# Patient Record
Sex: Female | Born: 1950
Health system: Southern US, Community
[De-identification: ages and names within clinical notes are randomized; demographics above are authoritative.]

## PROBLEM LIST (undated history)

## (undated) DIAGNOSIS — Z8619 Personal history of other infectious and parasitic diseases: Secondary | ICD-10-CM

## (undated) DIAGNOSIS — G43909 Migraine, unspecified, not intractable, without status migrainosus: Secondary | ICD-10-CM

## (undated) DIAGNOSIS — M25569 Pain in unspecified knee: Secondary | ICD-10-CM

## (undated) HISTORY — PX: REPLACEMENT TOTAL KNEE: SUR1224

## (undated) HISTORY — DX: Pain in unspecified knee: M25.569

## (undated) HISTORY — PX: TUBAL LIGATION: SHX77

---

## 2006-10-18 ENCOUNTER — Emergency Department: Payer: Self-pay | Admitting: Emergency Medicine

## 2006-12-20 ENCOUNTER — Emergency Department: Payer: Self-pay | Admitting: Emergency Medicine

## 2007-05-05 ENCOUNTER — Other Ambulatory Visit: Payer: Self-pay

## 2007-05-05 ENCOUNTER — Ambulatory Visit: Payer: Self-pay | Admitting: General Practice

## 2007-05-12 ENCOUNTER — Inpatient Hospital Stay: Payer: Self-pay | Admitting: General Practice

## 2008-09-02 ENCOUNTER — Emergency Department: Payer: Self-pay | Admitting: Emergency Medicine

## 2010-12-08 ENCOUNTER — Emergency Department: Payer: Self-pay | Admitting: Emergency Medicine

## 2010-12-12 ENCOUNTER — Other Ambulatory Visit: Payer: Self-pay | Admitting: General Practice

## 2010-12-12 DIAGNOSIS — K611 Rectal abscess: Secondary | ICD-10-CM

## 2010-12-16 ENCOUNTER — Ambulatory Visit
Admission: RE | Admit: 2010-12-16 | Discharge: 2010-12-16 | Disposition: A | Discharge status: Home / Self Care | Payer: BC Managed Care – PPO | Source: Ambulatory Visit | Attending: General Practice | Admitting: General Practice

## 2010-12-16 DIAGNOSIS — K611 Rectal abscess: Secondary | ICD-10-CM

## 2010-12-16 MED ORDER — GADOBENATE DIMEGLUMINE 529 MG/ML IV SOLN
20.0000 mL | Freq: Once | INTRAVENOUS | Status: AC | PRN
  Administered 2010-12-16: 20 mL via INTRAVENOUS

## 2010-12-28 ENCOUNTER — Telehealth: Payer: Self-pay | Admitting: Gastroenterology

## 2010-12-28 ENCOUNTER — Encounter: Payer: Self-pay | Admitting: Gastroenterology

## 2010-12-28 NOTE — Telephone Encounter (Signed)
I was under the impression that the pt needed a lower EUS.  I called Cyndra Numbers and asked her to verify that the pt needs EUS or New pt appt and she will call back the records are on Dr Christella Hartigan desk for review for an EUS

## 2010-12-29 ENCOUNTER — Telehealth: Payer: Self-pay

## 2010-12-29 NOTE — Telephone Encounter (Signed)
Pt scheduled for Lower Eus and needs to have previsit.  Per Dr Christella Hartigan pt is to have FULL COLON prep. Left message on machine to call back

## 2010-12-29 NOTE — Telephone Encounter (Signed)
appt to be made for Lower EUS office appt cx.  See 12/29/10 phone note

## 2011-01-01 NOTE — Telephone Encounter (Signed)
Left message on machine to call back  

## 2011-01-01 NOTE — Telephone Encounter (Signed)
Pt is scheduled for a Lower EUS with a FULL COLON prep on 01/11/11 at [facility].  She needs a pre visit for her instructions.  Left message on machine to call back

## 2011-01-01 NOTE — Telephone Encounter (Signed)
Pt returned call and has been scheduled for her pre visit. No pre visit letter mailed appt this week.  She did verbalize understanding.

## 2011-01-04 ENCOUNTER — Ambulatory Visit (AMBULATORY_SURGERY_CENTER): Payer: BC Managed Care – PPO | Admitting: *Deleted

## 2011-01-04 VITALS — Ht 65.0 in | Wt 289.0 lb

## 2011-01-04 DIAGNOSIS — K6289 Other specified diseases of anus and rectum: Secondary | ICD-10-CM

## 2011-01-04 MED ORDER — PEG-KCL-NACL-NASULF-NA ASC-C 100 G PO SOLR: 1.0000 | Freq: Once | ORAL | Status: AC

## 2011-01-10 ENCOUNTER — Emergency Department: Payer: Self-pay | Admitting: Internal Medicine

## 2011-01-11 ENCOUNTER — Telehealth: Payer: Self-pay

## 2011-01-11 ENCOUNTER — Encounter: Payer: BC Managed Care – PPO | Admitting: Gastroenterology

## 2011-01-11 MED ORDER — PEG-KCL-NACL-NASULF-NA ASC-C 100 G PO SOLR: 1.0000 | ORAL | Status: DC

## 2011-01-11 NOTE — Telephone Encounter (Signed)
Pt rescheduled because she was in the ER last night until 4 am and can not make it.  She was moved to 02/15/11.  Endo aware   Pt prep has been resent

## 2011-02-12 ENCOUNTER — Ambulatory Visit: Payer: BC Managed Care – PPO | Admitting: Gastroenterology

## 2011-02-15 ENCOUNTER — Encounter: Payer: BC Managed Care – PPO | Admitting: Gastroenterology

## 2011-02-15 ENCOUNTER — Telehealth: Payer: Self-pay | Admitting: Gastroenterology

## 2011-02-15 NOTE — Telephone Encounter (Signed)
Dr Abbey Chatters will be notified.

## 2011-02-15 NOTE — Telephone Encounter (Signed)
She did not show for EUS this AM.  This is second time (no show or last minute cancellation).  She cannot reschedule unless seen in the office ([facility] with me) or by referring MD Avel Peace) again.

## 2015-08-13 DIAGNOSIS — M25561 Pain in right knee: Secondary | ICD-10-CM | POA: Insufficient documentation

## 2015-08-13 DIAGNOSIS — G8929 Other chronic pain: Secondary | ICD-10-CM | POA: Insufficient documentation

## 2015-09-01 ENCOUNTER — Emergency Department: Payer: Self-pay

## 2015-09-01 ENCOUNTER — Encounter: Payer: Self-pay | Admitting: Emergency Medicine

## 2015-09-01 ENCOUNTER — Emergency Department
Admission: EM | Admit: 2015-09-01 | Discharge: 2015-09-01 | Disposition: A | Discharge status: Home / Self Care | Payer: Self-pay | Attending: Emergency Medicine | Admitting: Emergency Medicine

## 2015-09-01 DIAGNOSIS — G43809 Other migraine, not intractable, without status migrainosus: Secondary | ICD-10-CM | POA: Insufficient documentation

## 2015-09-01 LAB — CBC WITH DIFFERENTIAL/PLATELET
Basophils Absolute: 0.1 10*3/uL (ref 0–0.1)
Basophils Relative: 1 %
EOS PCT: 0 %
Eosinophils Absolute: 0 10*3/uL (ref 0–0.7)
HEMATOCRIT: 39.3 % (ref 35.0–47.0)
Hemoglobin: 13.2 g/dL (ref 12.0–16.0)
LYMPHS ABS: 0.7 10*3/uL — AB (ref 1.0–3.6)
LYMPHS PCT: 8 %
MCH: 31.1 pg (ref 26.0–34.0)
MCHC: 33.6 g/dL (ref 32.0–36.0)
MCV: 92.5 fL (ref 80.0–100.0)
MONO ABS: 0.5 10*3/uL (ref 0.2–0.9)
Monocytes Relative: 6 %
NEUTROS ABS: 7.7 10*3/uL — AB (ref 1.4–6.5)
Neutrophils Relative %: 85 %
PLATELETS: 156 10*3/uL (ref 150–440)
RBC: 4.25 MIL/uL (ref 3.80–5.20)
RDW: 13 % (ref 11.5–14.5)
WBC: 9.1 10*3/uL (ref 3.6–11.0)

## 2015-09-01 LAB — BASIC METABOLIC PANEL
Anion gap: 6 (ref 5–15)
BUN: 20 mg/dL (ref 6–20)
CALCIUM: 9.2 mg/dL (ref 8.9–10.3)
CO2: 26 mmol/L (ref 22–32)
Chloride: 106 mmol/L (ref 101–111)
Creatinine, Ser: 0.89 mg/dL (ref 0.44–1.00)
GFR calc Af Amer: >60 mL/min (ref >60)
GLUCOSE: 105 mg/dL — AB (ref 65–99)
POTASSIUM: 4.3 mmol/L (ref 3.5–5.1)
Sodium: 138 mmol/L (ref 135–145)

## 2015-09-01 MED ORDER — SODIUM CHLORIDE 0.9 % IV BOLUS (SEPSIS)
1000.0000 mL | Freq: Once | INTRAVENOUS | Status: AC
  Administered 2015-09-01: 1000 mL via INTRAVENOUS

## 2015-09-01 MED ORDER — KETOROLAC TROMETHAMINE 30 MG/ML IJ SOLN
30.0000 mg | Freq: Once | INTRAMUSCULAR | Status: AC
  Administered 2015-09-01: 30 mg via INTRAVENOUS
  Filled 2015-09-01: qty 1

## 2015-09-01 MED ORDER — MAGNESIUM SULFATE 2 GM/50ML IV SOLN
2.0000 g | Freq: Once | INTRAVENOUS | Status: AC
  Administered 2015-09-01: 2 g via INTRAVENOUS
  Filled 2015-09-01: qty 50

## 2015-09-01 MED ORDER — PROCHLORPERAZINE MALEATE 10 MG PO TABS: 10.0000 mg | ORAL_TABLET | Freq: Three times a day (TID) | ORAL | Status: DC | PRN

## 2015-09-01 MED ORDER — PROCHLORPERAZINE EDISYLATE 5 MG/ML IJ SOLN
10.0000 mg | Freq: Four times a day (QID) | INTRAMUSCULAR | Status: DC | PRN
  Administered 2015-09-01: 10 mg via INTRAVENOUS
  Filled 2015-09-01: qty 2

## 2015-09-01 NOTE — ED Notes (Signed)
Pt provided with warm blacnket and ice chips

## 2015-09-01 NOTE — ED Notes (Signed)
Pt sleeping, no distress noted at this time

## 2015-09-01 NOTE — ED Notes (Addendum)
Patient presents to the ED with headache since midnight.  Patient states her head feels very tight, "like it's going to explode".  Patient also reports nausea, vomiting and dizziness.  Patient has vomited about 6-8 times since onset of headache.  Patient has a history of similar headaches.  Patient denies any abdominal pain.

## 2015-09-01 NOTE — ED Notes (Signed)
Pt reports waking with a head ache last night at midnight. Pt reports 1 episode of emesis. Pt reports she has had similar headaches.

## 2015-09-01 NOTE — ED Provider Notes (Signed)
[facility] Emergency Department Provider Note    ____________________________________________  Time seen: ~1315  I have reviewed the triage vital signs and the nursing notes.   HISTORY  Chief Complaint Headache and Emesis   History limited by: Not Limited   HPI [name] is a 65 y.o. female who presents to the emergency department today because of concerns for headache. She states it started roughly 12 hours ago. It has been constant and severe since then. She describes it as sharp and located globally throughout her brain. She states she does have a history of migraines. She tried taking some ibuprofen without any relief. She states last time she had a headache this severe was one year ago. She is not on any specific migraine medications. She has had accompanied nausea and vomiting. Denies any abdominal pain. Denies any trauma to her head.     History reviewed. No pertinent past medical history.  There are no active problems to display for this patient.   Past Surgical History  Procedure Laterality Date  . Tubal ligation    . Replacement total knee      left knee    Current Outpatient Rx  Name  Route  Sig  Dispense  Refill  . HYDROcodone-acetaminophen (VICODIN ES) 7.5-750 MG per tablet   Oral   Take 1 tablet by mouth 4 times daily as needed.         . ondansetron (ZOFRAN-ODT) 4 MG disintegrating tablet   Oral   Take 1 tablet by mouth as needed.         . peg 3350 powder (MOVIPREP) 100 G SOLR   Oral   Take 1 kit (100 g total) by mouth as directed. See written handout   1 kit   0     Allergies Review of patient's allergies indicates no known allergies.  No family history on file.  Social History Social History  Substance Use Topics  . Smoking status: Never Smoker   . Smokeless tobacco: Never Used  . Alcohol Use: No    Review of Systems  Constitutional: Negative for fever. Cardiovascular: Negative for chest  pain. Respiratory: Negative for shortness of breath. Gastrointestinal: Negative for abdominal pain. Positive for nausea and vomiting Neurological: Positive for headache   10-point ROS otherwise negative.  ____________________________________________   PHYSICAL EXAM:  VITAL SIGNS: ED Triage Vitals  Enc Vitals Group     BP 09/01/15 1143 181/67 mmHg     Pulse Rate 09/01/15 1143 70     Resp 09/01/15 1143 18     Temp 09/01/15 1143 98.3 F (36.8 C)     Temp Source 09/01/15 1143 Oral     SpO2 09/01/15 1143 100 %     Weight 09/01/15 1143 300 lb (136.079 kg)     Height 09/01/15 1143 _0  (1.651 m)     Head Cir --      Peak Flow --      Pain Score 09/01/15 1144 10   Constitutional: Alert and oriented. Well appearing and in no distress. Eyes: Conjunctivae are normal. PERRL. Normal extraocular movements. ENT   Head: Normocephalic and atraumatic.   Nose: No congestion/rhinnorhea.   Mouth/Throat: Mucous membranes are moist.   Neck: No stridor. Hematological/Lymphatic/Immunilogical: No cervical lymphadenopathy. Cardiovascular: Normal rate, regular rhythm.  No murmurs, rubs, or gallops. Respiratory: Normal respiratory effort without tachypnea nor retractions. Breath sounds are clear and equal bilaterally. No wheezes/rales/rhonchi. Gastrointestinal: Soft and nontender. No distention.  Genitourinary: Deferred Musculoskeletal:  Normal range of motion in all extremities. No joint effusions.  No lower extremity tenderness nor edema. Neurologic:  Normal speech and language. No gross focal neurologic deficits are appreciated.  Skin:  Skin is warm, dry and intact. No rash noted. Psychiatric: Mood and affect are normal. Speech and behavior are normal. Patient exhibits appropriate insight and judgment.  ____________________________________________    LABS (pertinent positives/negatives)  Labs Reviewed  CBC WITH DIFFERENTIAL/PLATELET - Abnormal; Notable for the following:     Neutro Abs 7.7 (*)    Lymphs Abs 0.7 (*)    All other components within normal limits  BASIC METABOLIC PANEL - Abnormal; Notable for the following:    Glucose, Bld 105 (*)    All other components within normal limits    ____________________________________________   EKG  None  ____________________________________________    RADIOLOGY  CT head IMPRESSION: No focal acute intracranial abnormality identified.  ____________________________________________   PROCEDURES  Procedure(s) performed: None  Critical Care performed: No  ____________________________________________   INITIAL IMPRESSION / ASSESSMENT AND PLAN / ED COURSE  Pertinent labs & imaging results that were available during my care of the patient were reviewed by me and considered in my medical decision making (see chart for details).  Patient presented to the emergency department today because of concerns for migraine. Patient states she has a history of migraines and last time she had a migraine this bad was last year. Patient states she has never had neuro imaging. Because of this I decided to get a CT head which did not show any concerning findings. Furthermore the patient did feel better after IV fluids and medications. Will plan on discharging home with prescription for Compazine ____________________________________________   FINAL CLINICAL IMPRESSION(S) / ED DIAGNOSES  Final diagnoses:  Other type of migraine     [name], MD 09/01/15 1904

## 2015-09-01 NOTE — Discharge Instructions (Signed)
Please seek medical attention for any high fevers, chest pain, shortness of breath, change in behavior, persistent vomiting, bloody stool or any other new or concerning symptoms. ° ° °Migraine Headache °A migraine headache is an intense, throbbing pain on one or both sides of your head. A migraine can last for 30 minutes to several hours. °CAUSES  °The exact cause of a migraine headache is not always known. However, a migraine may be caused when nerves in the brain become irritated and release chemicals that cause inflammation. This causes pain. °Certain things may also trigger migraines, such as: °· Alcohol. °· Smoking. °· Stress. °· Menstruation. °· Aged cheeses. °· Foods or drinks that contain nitrates, glutamate, aspartame, or tyramine. °· Lack of sleep. °· Chocolate. °· Caffeine. °· Hunger. °· Physical exertion. °· Fatigue. °· Medicines used to treat chest pain (nitroglycerine), birth control pills, estrogen, and some blood pressure medicines. °SIGNS AND SYMPTOMS °· Pain on one or both sides of your head. °· Pulsating or throbbing pain. °· Severe pain that prevents daily activities. °· Pain that is aggravated by any physical activity. °· Nausea, vomiting, or both. °· Dizziness. °· Pain with exposure to bright lights, loud noises, or activity. °· General sensitivity to bright lights, loud noises, or smells. °Before you get a migraine, you may get warning signs that a migraine is coming (aura). An aura may include: °· Seeing flashing lights. °· Seeing bright spots, halos, or zigzag lines. °· Having tunnel vision or blurred vision. °· Having feelings of numbness or tingling. °· Having trouble talking. °· Having muscle weakness. °DIAGNOSIS  °A migraine headache is often diagnosed based on: °· Symptoms. °· Physical exam. °· A CT scan or MRI of your head. These imaging tests cannot diagnose migraines, but they can help rule out other causes of headaches. °TREATMENT °Medicines may be given for pain and nausea.  Medicines can also be given to help prevent recurrent migraines.  °HOME CARE INSTRUCTIONS °· Only take over-the-counter or prescription medicines for pain or discomfort as directed by your health care provider. The use of long-term narcotics is not recommended. °· Lie down in a dark, quiet room when you have a migraine. °· Keep a journal to find out what may trigger your migraine headaches. For example, write down: °¨ What you eat and drink. °¨ How much sleep you get. °¨ Any change to your diet or medicines. °· Limit alcohol consumption. °· Quit smoking if you smoke. °· Get 7-9 hours of sleep, or as recommended by your health care provider. °· Limit stress. °· Keep lights dim if bright lights bother you and make your migraines worse. °SEEK IMMEDIATE MEDICAL CARE IF:  °· Your migraine becomes severe. °· You have a fever. °· You have a stiff neck. °· You have vision loss. °· You have muscular weakness or loss of muscle control. °· You start losing your balance or have trouble walking. °· You feel faint or pass out. °· You have severe symptoms that are different from your first symptoms. °MAKE SURE YOU:  °· Understand these instructions. °· Will watch your condition. °· Will get help right away if you are not doing well or get worse. °  °This information is not intended to replace advice given to you by your health care provider. Make sure you discuss any questions you have with your health care provider. °  °Document Released: 07/09/2005 Document Revised: 07/30/2014 Document Reviewed: 03/16/2013 °Elsevier Interactive Patient Education ©2016 [employer] ° °

## 2016-12-19 ENCOUNTER — Encounter: Payer: Self-pay | Admitting: *Deleted

## 2016-12-19 ENCOUNTER — Emergency Department: Payer: Medicare Other

## 2016-12-19 ENCOUNTER — Emergency Department
Admission: EM | Admit: 2016-12-19 | Discharge: 2016-12-20 | Disposition: A | Discharge status: Home / Self Care | Payer: Medicare Other | Attending: Emergency Medicine | Admitting: Emergency Medicine

## 2016-12-19 DIAGNOSIS — S60911A Unspecified superficial injury of right wrist, initial encounter: Secondary | ICD-10-CM | POA: Diagnosis present

## 2016-12-19 DIAGNOSIS — Y999 Unspecified external cause status: Secondary | ICD-10-CM | POA: Insufficient documentation

## 2016-12-19 DIAGNOSIS — Z79899 Other long term (current) drug therapy: Secondary | ICD-10-CM | POA: Insufficient documentation

## 2016-12-19 DIAGNOSIS — M25531 Pain in right wrist: Secondary | ICD-10-CM | POA: Insufficient documentation

## 2016-12-19 DIAGNOSIS — Y939 Activity, unspecified: Secondary | ICD-10-CM | POA: Diagnosis not present

## 2016-12-19 DIAGNOSIS — W1789XA Other fall from one level to another, initial encounter: Secondary | ICD-10-CM | POA: Diagnosis not present

## 2016-12-19 DIAGNOSIS — M25572 Pain in left ankle and joints of left foot: Secondary | ICD-10-CM | POA: Insufficient documentation

## 2016-12-19 DIAGNOSIS — Y929 Unspecified place or not applicable: Secondary | ICD-10-CM | POA: Insufficient documentation

## 2016-12-19 DIAGNOSIS — Z96652 Presence of left artificial knee joint: Secondary | ICD-10-CM | POA: Insufficient documentation

## 2016-12-19 MED ORDER — TRAMADOL HCL 50 MG PO TABS: 50.0000 mg | ORAL_TABLET | Freq: Four times a day (QID) | ORAL | 0 refills | Status: AC | PRN

## 2016-12-19 NOTE — ED Triage Notes (Signed)
Pt lost balance and fell off the porch tonight.  Pt has right wrist pain and left ankle pain.    Pt alert.

## 2016-12-20 NOTE — ED Provider Notes (Signed)
[facility] Emergency Department Provider Note  ____________________________________________  Time seen: Approximately 6:22 PM  I have reviewed the triage vital signs and the nursing notes.   HISTORY  Chief Complaint Wrist Pain    HPI [name] is a 66 y.o. female presents to emergency department after losing her balance and falling off her porch. Patient did not hit her head or loose consciousness. She reports 9/10 aching right wrist and left ankle pain. She denies numbness, tingling or loss of sensation of the upper or lower extremities. No neck pain, back pain, chest pain, chest tightness, shortness of breath, nausea, vomiting or abdominal pain. No alleviating measures have been attempted.   No past medical history on file.  There are no active problems to display for this patient.   Past Surgical History:  Procedure Laterality Date  . REPLACEMENT TOTAL KNEE     left knee  . TUBAL LIGATION      Prior to Admission medications   Medication Sig Start Date End Date Taking? Authorizing Provider  HYDROcodone-acetaminophen (VICODIN ES) 7.5-750 MG per tablet Take 1 tablet by mouth 4 times daily as needed. 12/12/10   [provider]  ondansetron (ZOFRAN-ODT) 4 MG disintegrating tablet Take 1 tablet by mouth as needed. 12/09/10   [provider]  peg 3350 powder (MOVIPREP) 100 G SOLR Take 1 kit (100 g total) by mouth as directed. See written handout 01/11/11   Milus Banister, MD  prochlorperazine (COMPAZINE) 10 MG tablet Take 1 tablet (10 mg total) by mouth every 8 (eight) hours as needed (headache). 09/01/15   [name], MD  traMADol (ULTRAM) 50 MG tablet Take 1 tablet (50 mg total) by mouth every 6 (six) hours as needed. 12/19/16 12/24/16  Lannie Fields, PA-C    Allergies Patient has no known allergies.  No family history on file.  Social History Social History  Substance Use Topics  . Smoking status: Never Smoker  .  Smokeless tobacco: Never Used  . Alcohol use No     Review of Systems  Constitutional: No fever/chills Eyes: No visual changes. No discharge ENT: No upper respiratory complaints. Cardiovascular: no chest pain. Respiratory: no cough. No SOB. Musculoskeletal: Patient has right wrist and left ankle pain.  Skin: Negative for rash, abrasions, lacerations, ecchymosis. Neurological: Negative for headaches, focal weakness or numbness.  ____________________________________________   PHYSICAL EXAM:  VITAL SIGNS: ED Triage Vitals  Enc Vitals Group     BP 12/19/16 2157 (!) 155/69     Pulse Rate 12/19/16 2157 75     Resp 12/19/16 2157 20     Temp 12/19/16 2157 98 F (36.7 C)     Temp Source 12/19/16 2157 Oral     SpO2 12/19/16 2157 99 %     Weight 12/19/16 2154 300 lb (136.1 kg)     Height 12/19/16 2154 5' 3"  (1.6 m)     Head Circumference --      Peak Flow --      Pain Score 12/19/16 2154 7     Pain Loc --      Pain Edu? --      Excl. in [place]? --      Constitutional: Alert and oriented. Well appearing and in no acute distress. Eyes: Conjunctivae are normal. PERRL. EOMI. Head: Atraumatic. Cardiovascular: Normal rate, regular rhythm. Normal S1 and S2.  Good peripheral circulation. Respiratory: Normal respiratory effort without tachypnea or retractions. Lungs CTAB. Good air entry to the bases with  no decreased or absent breath sounds. Musculoskeletal: Patient has 5 out of 5 strength in the upper and lower extremities bilaterally. Full range of motion at the right shoulder, right elbow and right wrist. No tenderness was elicited with palpation over the right anatomical snuffbox. Patient demonstrates full range of motion at the left ankle and left knee. Palpable radial, ulnar and dorsalis pedis pulses bilaterally and symmetrically. Neurologic:  Normal speech and language. No gross focal neurologic deficits are appreciated.  Skin:  Skin is warm, dry and intact. No rash  noted. Psychiatric: Mood and affect are normal. Speech and behavior are normal. Patient exhibits appropriate insight and judgement. ____________________________________________   LABS (all labs ordered are listed, but only abnormal results are displayed)  Labs Reviewed - No data to display ____________________________________________  EKG   ____________________________________________  RADIOLOGY Unk Pinto, personally viewed and evaluated these images (plain radiographs) as part of my medical decision making, as well as reviewing the written report by the radiologist.  Dg Forearm Right  Result Date: 12/19/2016 CLINICAL DATA:  Fall from [place].  RIGHT wrist pain. EXAM: RIGHT WRIST - COMPLETE 3+ VIEW; RIGHT FOREARM - 2 VIEW COMPARISON:  None. FINDINGS: There is no evidence of fracture or dislocation. There is no evidence of arthropathy or other focal bone abnormality. Faint vascular calcifications. IMPRESSION: Negative. Electronically Signed   By: Elon Alas M.D.   On: 12/19/2016 23:49   Dg Wrist Complete Right  Result Date: 12/19/2016 CLINICAL DATA:  Fall from porch.  RIGHT wrist pain. EXAM: RIGHT WRIST - COMPLETE 3+ VIEW; RIGHT FOREARM - 2 VIEW COMPARISON:  None. FINDINGS: There is no evidence of fracture or dislocation. There is no evidence of arthropathy or other focal bone abnormality. Faint vascular calcifications. IMPRESSION: Negative. Electronically Signed   By: Elon Alas M.D.   On: 12/19/2016 23:49   Dg Ankle Complete Left  Result Date: 12/19/2016 CLINICAL DATA:  66 year old female with fall and left ankle pain. EXAM: LEFT ANKLE COMPLETE - 3+ VIEW COMPARISON:  None. FINDINGS: There is no acute fracture or dislocation. The bones are well mineralized. No arthritic changes. The ankle mortise is intact. There is diffuse subcutaneous edema of the left lower extremity. IMPRESSION: No acute/ traumatic osseous pathology. Electronically Signed   By: Anner Crete  M.D.   On: 12/19/2016 23:51    ____________________________________________    PROCEDURES  Procedure(s) performed:    Procedures    Medications - No data to display   ____________________________________________   INITIAL IMPRESSION / ASSESSMENT AND PLAN / ED COURSE  Pertinent labs & imaging results that were available during my care of the patient were reviewed by me and considered in my medical decision making (see chart for details).  Review of the [place] CSRS was performed in accordance of the [place] prior to dispensing any controlled drugs.    Assessment and plan: Right wrist pain: Acute left ankle pain:  Patient presents to the emergency department with right wrist and left ankle pain after falling off a porch. Physical exam was reassuring. DG right wrist and DG left ankle reveal no acute fractures or bony abnormalities. Patient was discharged with tramadol. A referral was given to orthopedics, Dr. Marry Guan. All patient questions were answered. ____________________________________________  FINAL CLINICAL IMPRESSION(S) / ED DIAGNOSES  Final diagnoses:  Right wrist pain  Acute left ankle pain      NEW MEDICATIONS STARTED DURING THIS VISIT:  Discharge Medication List as of 12/19/2016 11:58 PM    START taking  these medications   Details  traMADol (ULTRAM) 50 MG tablet Take 1 tablet (50 mg total) by mouth every 6 (six) hours as needed., Starting Wed 12/19/2016, Until Mon 12/24/2016, Print            This chart was dictated using voice recognition software/Dragon. Despite best efforts to proofread, errors can occur which can change the meaning. Any change was purely unintentional.    Lannie Fields, PA-C 12/20/16 1830    Hinda Kehr, MD 12/20/16 [phone]

## 2019-12-13 ENCOUNTER — Other Ambulatory Visit: Payer: Self-pay

## 2019-12-13 ENCOUNTER — Emergency Department
Admission: EM | Admit: 2019-12-13 | Discharge: 2019-12-13 | Disposition: A | Discharge status: Home / Self Care | Payer: Medicare Other | Attending: Student | Admitting: Student

## 2019-12-13 ENCOUNTER — Encounter: Payer: Self-pay | Admitting: Emergency Medicine

## 2019-12-13 DIAGNOSIS — Z96652 Presence of left artificial knee joint: Secondary | ICD-10-CM | POA: Diagnosis not present

## 2019-12-13 DIAGNOSIS — B029 Zoster without complications: Secondary | ICD-10-CM | POA: Insufficient documentation

## 2019-12-13 DIAGNOSIS — R519 Headache, unspecified: Secondary | ICD-10-CM | POA: Insufficient documentation

## 2019-12-13 HISTORY — DX: Migraine, unspecified, not intractable, without status migrainosus: G43.909

## 2019-12-13 MED ORDER — DIPHENHYDRAMINE HCL 50 MG/ML IJ SOLN
25.0000 mg | Freq: Once | INTRAMUSCULAR | Status: AC
  Administered 2019-12-13: 25 mg via INTRAVENOUS
  Filled 2019-12-13: qty 1

## 2019-12-13 MED ORDER — METOCLOPRAMIDE HCL 5 MG/ML IJ SOLN
10.0000 mg | Freq: Once | INTRAMUSCULAR | Status: AC
  Administered 2019-12-13: 10 mg via INTRAVENOUS
  Filled 2019-12-13: qty 2

## 2019-12-13 MED ORDER — VALACYCLOVIR HCL 500 MG PO TABS
1000.0000 mg | ORAL_TABLET | Freq: Once | ORAL | Status: AC
  Administered 2019-12-13: 1000 mg via ORAL
  Filled 2019-12-13: qty 2

## 2019-12-13 MED ORDER — OXYCODONE HCL 5 MG PO TABS
5.0000 mg | ORAL_TABLET | Freq: Once | ORAL | Status: AC
  Administered 2019-12-13: 5 mg via ORAL
  Filled 2019-12-13: qty 1

## 2019-12-13 MED ORDER — ACETAMINOPHEN 500 MG PO TABS
1000.0000 mg | ORAL_TABLET | Freq: Once | ORAL | Status: AC
  Administered 2019-12-13: 1000 mg via ORAL
  Filled 2019-12-13: qty 2

## 2019-12-13 MED ORDER — VALACYCLOVIR HCL 1 G PO TABS: 1000.0000 mg | ORAL_TABLET | Freq: Three times a day (TID) | ORAL | 0 refills | Status: DC

## 2019-12-13 MED ORDER — SODIUM CHLORIDE 0.9 % IV BOLUS
1000.0000 mL | Freq: Once | INTRAVENOUS | Status: AC
  Administered 2019-12-13: 1000 mL via INTRAVENOUS

## 2019-12-13 MED ORDER — KETOROLAC TROMETHAMINE 30 MG/ML IJ SOLN
15.0000 mg | Freq: Once | INTRAMUSCULAR | Status: AC
  Administered 2019-12-13: 15 mg via INTRAVENOUS
  Filled 2019-12-13: qty 1

## 2019-12-13 MED ORDER — OXYCODONE HCL 5 MG PO TABS: 5.0000 mg | ORAL_TABLET | Freq: Four times a day (QID) | ORAL | 0 refills | Status: AC | PRN

## 2019-12-13 NOTE — ED Provider Notes (Signed)
[facility] Emergency Department Provider Note  ____________________________________________   First MD Initiated Contact with Patient 12/13/19 1224     (approximate)  I have reviewed the triage vital signs and the nursing notes.  History  Chief Complaint Herpes Zoster and Migraine    HPI [name] is a 69 y.o. female with a history of migraines who presents to the emergency department with complaint of headache, and complaint of rash to face/neck.    First, she complains of a migraine headache that has been ongoing for the last week.  This is consistent with her prior migraines.  She reports a throbbing type headache, moderate in severity.  Associated with photophobia, nausea.  Not improved at home with Tylenol.  She does not take any other medications for her migraines.  Headache has been progressively worsening over the last week.  Not thunderclap in description, not worst at onset.  No fevers or neck stiffness.  No associated weakness, numbness, tingling, speech difficulties.  States she typically comes to the emergency department and gets IV medications that help relieve her migraines.   Second, she also complains of a rash to the back of her head, neck, and ear on the right side.  This rash has been present for the last 3 days and worsening since onset.  Associated with a sharp, shooting type pain.  Moderate in severity, no radiation, no alleviating/aggravating components.  Rash is not pruritic.  She denies any known contacts or exposures.  Does have a history of chickenpox.  Not on any immune no suppressant medications.  No eye pain or blurred vision.  No rash to the anterior face.   Past Medical Hx Past Medical History:  Diagnosis Date  . Migraines     Problem List There are no problems to display for this patient.   Past Surgical Hx Past Surgical History:  Procedure Laterality Date  . REPLACEMENT TOTAL KNEE     left knee  . TUBAL  LIGATION      Medications Prior to Admission medications   Medication Sig Start Date End Date Taking? Authorizing Provider  HYDROcodone-acetaminophen (VICODIN ES) 7.5-750 MG per tablet Take 1 tablet by mouth 4 times daily as needed. 12/12/10   [provider]  ondansetron (ZOFRAN-ODT) 4 MG disintegrating tablet Take 1 tablet by mouth as needed. 12/09/10   [provider]  peg 3350 powder (MOVIPREP) 100 G SOLR Take 1 kit (100 g total) by mouth as directed. See written handout 01/11/11   Milus Banister, MD  prochlorperazine (COMPAZINE) 10 MG tablet Take 1 tablet (10 mg total) by mouth every 8 (eight) hours as needed (headache). 09/01/15   [name], MD    Allergies Patient has no known allergies.  Family Hx History reviewed. No pertinent family history.  Social Hx Social History   Tobacco Use  . Smoking status: Never Smoker  . Smokeless tobacco: Never Used  Substance Use Topics  . Alcohol use: No  . Drug use: No     Review of Systems  Constitutional: Negative for fever. Negative for chills. Eyes: Negative for visual changes. ENT: Negative for sore throat. Cardiovascular: Negative for chest pain. Respiratory: Negative for shortness of breath. Gastrointestinal: Negative for nausea. Negative for vomiting.  Genitourinary: Negative for dysuria. Musculoskeletal: Negative for leg swelling. Skin: + for rash. Neurological: + for headaches.   Physical Exam  Vital Signs: ED Triage Vitals  Enc Vitals Group     BP 12/13/19 1112 (!) 166/81  Pulse Rate 12/13/19 1112 88     Resp 12/13/19 1112 18     Temp 12/13/19 1112 98.6 F (37 C)     Temp Source 12/13/19 1112 Oral     SpO2 12/13/19 1112 96 %     Weight 12/13/19 1109 300 lb (136.1 kg)     Height 12/13/19 1109 _0  (1.6 m)     Head Circumference --      Peak Flow --      Pain Score 12/13/19 1109 8     Pain Loc --      Pain Edu? --      Excl. in [place]? --     Constitutional: Alert and oriented.   NAD.  Head: Dermatomal, maculopapular, erythematous, vesicular rash from the midline posterior head that extends anteriorly to include the RIGHT ear and the RIGHT lateral/anterior neck, stops at midline, does not cross midline. Rash does NOT involve the face anterior to the ear, no CN V involvement. Consistent with C2 distribution. No facial droop.  Eyes: Conjunctivae clear. Sclera anicteric. Pupils equal and symmetric. Nose: No masses or lesions. No congestion or rhinorrhea. Mouth/Throat: Wearing mask.  Neck: No stridor. Trachea midline.  Cardiovascular: Normal rate, regular rhythm. Extremities well perfused. Respiratory: Normal respiratory effort.  Lungs CTAB. Gastrointestinal: Soft. Non-distended. Non-tender.  Genitourinary: Deferred. Musculoskeletal: No lower extremity edema. No deformities. Neurologic:  Normal speech and language. No gross focal or lateralizing neurologic deficits are appreciated. Alert and oriented.  Face symmetric.  Tongue midline.  Cranial nerves II through XII intact. UE and LE strength 5/5 and symmetric. UE and LE SILT.  Skin: Dermatomal, maculopapular, erythematous, vesicular rash from the midline posterior head that extends anteriorly to include the RIGHT ear and the RIGHT lateral/anterior neck, stops at midline. Rash does NOT involve the face anterior to the ear, no CN V involvement. Consistent with C2 distribution. No facial droop.  Psychiatric: Mood and affect are appropriate for situation.   Procedures  Procedure(s) performed (including critical care):  Procedures   Initial Impression / Assessment and Plan / MDM / ED Course  69 y.o. female who presents to the ED for migraine headache, as well as a rash to the back of the head, ear, neck area consistent with shingles in the C2 distribution.  Headache seems consistent with her history of migraines, could also be potentially worsened by her shingles.  No fever, AMS, neck stiffness to suggest infectious  etiology of headache, do not suspect meningitis or encephalitis.  No associated neurological symptoms and she has a non-focal neurological exam, therefore doubt acute intracranial etiology of headache such as CVA.  Migraine is consistent with her prior headaches, not thunderclap in description or worst at onset, do not suspect SAH.  With regards to her rash, her clinical presentation is consistent with shingles in the C2 distribution.  She has no rash involving the anterior face or cranial nerve V distribution, therefore do not suspect ocular involvement.  Will plan for treatment of migraine with IV medications, and plan for course of antivirals for her shingles.  Anticipate discharge with Rx for valacyclovir and outpatient follow-up.  3:30 PMPpatient reports improvement in her headache and feels comfortable with discharge at this time.  Will plan for discharge with Rx for Valtrex and pain control.  Advised outpatient follow-up, given PCP referrals.  Patient voices understanding and is comfortable with the plan and discharge.  Given return precautions.  _______________________________   As part of my medical decision making I have  reviewed available labs, radiology tests, reviewed old records/performed chart review, obtained additional history from family.    Final Clinical Impression(s) / ED Diagnosis  Final diagnoses:  Complaint of headache  Herpes zoster without complication       Note:  This document was prepared using Dragon voice recognition software and may include unintentional dictation errors.   Lilia Pro., MD 12/13/19 [phone]

## 2019-12-13 NOTE — ED Triage Notes (Signed)
Pt here for migraine with hx of migraines.  + nausea and mild photophobia.  Pt also has shingles like rash to right neck/scalp. No vision changes. No pain inside ear. No fever. Lesions not draining at this time.

## 2019-12-13 NOTE — Discharge Instructions (Addendum)
Thank you for letting us take care of you in the emergency department today.  At this time, we believe your rash is related to shingles.  We will treat you for this with antiviral medication and pain medication.  Please continue to take any other regular, prescribed medications.   New medications we have prescribed:  Valacyclovir (Valtrex) - antiviral medication to help treat your shingles Oxycodone, pain medication  Please follow up with a primary care doctor for recheck within 1 week.  Information for several clinics as below.   Please return to the ER for any new or worsening symptoms.

## 2019-12-18 ENCOUNTER — Other Ambulatory Visit: Payer: Self-pay

## 2019-12-18 ENCOUNTER — Ambulatory Visit (INDEPENDENT_AMBULATORY_CARE_PROVIDER_SITE_OTHER): Payer: Medicare Other | Admitting: Family Medicine

## 2019-12-18 ENCOUNTER — Encounter: Payer: Self-pay | Admitting: Family Medicine

## 2019-12-18 VITALS — BP 192/83 | HR 67 | Temp 97.8°F | Ht 63.7 in | Wt 302.0 lb

## 2019-12-18 DIAGNOSIS — B029 Zoster without complications: Secondary | ICD-10-CM

## 2019-12-18 DIAGNOSIS — Z7689 Persons encountering health services in other specified circumstances: Secondary | ICD-10-CM

## 2019-12-18 DIAGNOSIS — M1711 Unilateral primary osteoarthritis, right knee: Secondary | ICD-10-CM

## 2019-12-18 MED ORDER — VALACYCLOVIR HCL 1 G PO TABS: 1000.0000 mg | ORAL_TABLET | Freq: Three times a day (TID) | ORAL | 0 refills | Status: AC

## 2019-12-18 MED ORDER — OXYCODONE-ACETAMINOPHEN 10-325 MG PO TABS: 1.0000 | ORAL_TABLET | Freq: Three times a day (TID) | ORAL | 0 refills | Status: AC | PRN

## 2019-12-18 MED ORDER — GABAPENTIN 300 MG PO CAPS: 300.0000 mg | ORAL_CAPSULE | Freq: Three times a day (TID) | ORAL | 0 refills | Status: DC

## 2019-12-18 MED ORDER — PREDNISONE 10 MG PO TABS: ORAL_TABLET | ORAL | 0 refills | Status: DC

## 2019-12-18 NOTE — Progress Notes (Signed)
BP (!) 192/83   Pulse 67   Temp 97.8 F (36.6 C) (Oral)   Ht 5' 3.7" (1.618 m)   Wt (!) 302 lb (137 kg)   SpO2 98%   BMI 52.33 kg/m    Subjective:    Patient ID: [name], female    DOB: 11-07-1950, 69 y.o.   MRN: [id number]  HPI: [name] is a 69 y.o. female  Chief Complaint  Patient presents with  . Establish Care  . Rash    head and neck x about a week   Patient presenting today to establish care. No known medical problems other than OA of right knee, states she hasn't had a regular provider in many years. Not taking any chronic medications at this time.  Main concern currently is a severe case of shingles that came up over a week ago. Has now been to the ER twice for it and is currently on valtrex and prednisone. Right sided neck up to scalp blistering painful rash. Some associated headache but otherwise feeling well aside from the painful rash. Mild improvement since starting prednisone.   Relevant past medical, surgical, family and social history reviewed and updated as indicated. Interim medical history since our last visit reviewed. Allergies and medications reviewed and updated.  Review of Systems  Per HPI unless specifically indicated above     Objective:    BP (!) 192/83   Pulse 67   Temp 97.8 F (36.6 C) (Oral)   Ht 5' 3.7" (1.618 m)   Wt (!) 302 lb (137 kg)   SpO2 98%   BMI 52.33 kg/m   Wt Readings from Last 3 Encounters:  12/18/19 (!) 302 lb (137 kg)  12/13/19 300 lb (136.1 kg)  12/19/16 300 lb (136.1 kg)    Physical Exam Vitals and nursing note reviewed.  Constitutional:      Appearance: Normal appearance. She is not ill-appearing.  HENT:     Head: Atraumatic.  Eyes:     Extraocular Movements: Extraocular movements intact.     Conjunctiva/sclera: Conjunctivae normal.  Cardiovascular:     Rate and Rhythm: Normal rate and regular rhythm.     Heart sounds: Normal heart sounds.  Pulmonary:     Effort: Pulmonary effort is  normal.     Breath sounds: Normal breath sounds.  Musculoskeletal:        General: Normal range of motion.     Cervical back: Normal range of motion and neck supple.  Skin:    General: Skin is warm and dry.     Findings: Erythema and rash (blistering erythematous rash right neck up to scalp, scabbing in some areas) present.  Neurological:     Mental Status: She is alert and oriented to person, place, and time.  Psychiatric:        Mood and Affect: Mood normal.        Thought Content: Thought content normal.        Judgment: Judgment normal.     Results for orders placed or performed during the hospital encounter of 09/01/15  CBC with Differential  Result Value Ref Range   WBC 9.1 3.6 - 11.0 K/uL   RBC 4.25 3.80 - 5.20 MIL/uL   Hemoglobin 13.2 12.0 - 16.0 g/dL   HCT 37.9 02.4 - 09.7 %   MCV 92.5 80.0 - 100.0 fL   MCH 31.1 26.0 - 34.0 pg   MCHC 33.6 32.0 - 36.0 g/dL   RDW 35.3 29.9 -  14.5 %   Platelets 156 150 - 440 K/uL   Neutrophils Relative % 85 %   Neutro Abs 7.7 (H) 1.4 - 6.5 K/uL   Lymphocytes Relative 8 %   Lymphs Abs 0.7 (L) 1.0 - 3.6 K/uL   Monocytes Relative 6 %   Monocytes Absolute 0.5 0.2 - 0.9 K/uL   Eosinophils Relative 0 %   Eosinophils Absolute 0.0 0 - 0.7 K/uL   Basophils Relative 1 %   Basophils Absolute 0.1 0 - 0.1 K/uL  Basic metabolic panel  Result Value Ref Range   Sodium 138 135 - 145 mmol/L   Potassium 4.3 3.5 - 5.1 mmol/L   Chloride 106 101 - 111 mmol/L   CO2 26 22 - 32 mmol/L   Glucose, Bld 105 (H) 65 - 99 mg/dL   BUN 20 6 - 20 mg/dL   Creatinine, Ser 0.89 0.44 - 1.00 mg/dL   Calcium 9.2 8.9 - 10.3 mg/dL   GFR calc non Af Amer >60 >60 mL/min   GFR calc Af Amer >60 >60 mL/min   Anion gap 6 5 - 15      Assessment & Plan:   Problem List Items Addressed This Visit      Musculoskeletal and Integument   Osteoarthritis of right knee    Followed by Orthopedics, continue per their recommendations      Relevant Medications   Acetaminophen  (TYLENOL 8 HOUR PO)   OXYCODONE HCL PO   predniSONE (DELTASONE) 10 MG tablet   oxyCODONE-acetaminophen (PERCOCET) 10-325 MG tablet    Other Visit Diagnoses    Herpes zoster without complication    -  Primary   Signiciant rash prsent and some progression still occurring in right scalp. Complete current rx's and start second round of valtrex and prednisone. F/u if worse   Relevant Medications   valACYclovir (VALTREX) 1000 MG tablet   Encounter to establish care           Follow up plan: Return in about 4 weeks (around 01/15/2020) for CPE.

## 2019-12-21 DIAGNOSIS — M1711 Unilateral primary osteoarthritis, right knee: Secondary | ICD-10-CM | POA: Insufficient documentation

## 2019-12-21 NOTE — Assessment & Plan Note (Signed)
Followed by Orthopedics, continue per their recommendations

## 2019-12-28 ENCOUNTER — Telehealth: Payer: Self-pay | Admitting: Family Medicine

## 2019-12-28 ENCOUNTER — Other Ambulatory Visit: Payer: Self-pay

## 2019-12-28 ENCOUNTER — Ambulatory Visit (INDEPENDENT_AMBULATORY_CARE_PROVIDER_SITE_OTHER): Payer: Medicare Other | Admitting: Family Medicine

## 2019-12-28 ENCOUNTER — Encounter: Payer: Self-pay | Admitting: Family Medicine

## 2019-12-28 VITALS — BP 158/83 | HR 80 | Temp 98.5°F | Ht 65.0 in | Wt 300.0 lb

## 2019-12-28 DIAGNOSIS — B029 Zoster without complications: Secondary | ICD-10-CM

## 2019-12-28 MED ORDER — PREDNISONE 10 MG PO TABS: ORAL_TABLET | ORAL | 0 refills | Status: DC

## 2019-12-28 MED ORDER — VALACYCLOVIR HCL 1 G PO TABS: 1000.0000 mg | ORAL_TABLET | Freq: Three times a day (TID) | ORAL | 0 refills | Status: DC

## 2019-12-28 MED ORDER — OXYCODONE-ACETAMINOPHEN 10-325 MG PO TABS: 1.0000 | ORAL_TABLET | Freq: Four times a day (QID) | ORAL | 0 refills | Status: AC | PRN

## 2019-12-28 NOTE — Telephone Encounter (Signed)
Called pt to schedule an appt no answer left vm to call back to schedule  Copied from CRM [id number]. Topic: General - Other >> Dec 28, 2019 11:18 AM Marylen Ponto wrote: Reason for CRM: Pt stated she needs to speak with Roosvelt Maser about shingles. Pt declined to provide more details and just requested that Roosvelt Maser return her call.

## 2019-12-28 NOTE — Progress Notes (Signed)
BP (!) 158/83 (BP Location: Left Arm, Patient Position: Sitting, Cuff Size: Normal)   Pulse 80   Temp 98.5 F (36.9 C) (Oral)   Ht 5\' 5"  (1.651 m)   Wt 300 lb (136.1 kg)   SpO2 97%   BMI 49.92 kg/m    Subjective:    Patient ID: , female    DOB: 07-10-1951, 69 y.o.   MRN: [id number]  HPI: [name] is a 69 y.o. female  Chief Complaint  Patient presents with  . Herpes Zoster    Patient states she has sores on her head. Extremely painful. Would like refill on medications.    Here today for f/u severe shingles rash on right side of neck/face/scalp. Has been on valtrex and prednisone for 2 weeks now which has been helping some and neck rash is mostly dried up but scalp rash is still significant, scabbing and severely painful. Oxycodone and gabapentin helping some but still in severe pain at times. Denies visual changes, hearing changes, fever, chills, syncope, confusion.   Relevant past medical, surgical, family and social history reviewed and updated as indicated. Interim medical history since our last visit reviewed. Allergies and medications reviewed and updated.  Review of Systems  Per HPI unless specifically indicated above     Objective:    BP (!) 158/83 (BP Location: Left Arm, Patient Position: Sitting, Cuff Size: Normal)   Pulse 80   Temp 98.5 F (36.9 C) (Oral)   Ht 5\' 5"  (1.651 m)   Wt 300 lb (136.1 kg)   SpO2 97%   BMI 49.92 kg/m   Wt Readings from Last 3 Encounters:  12/28/19 300 lb (136.1 kg)  12/18/19 (!) 302 lb (137 kg)  12/13/19 300 lb (136.1 kg)    Physical Exam Vitals and nursing note reviewed.  Constitutional:      Appearance: Normal appearance. She is not ill-appearing.  HENT:     Head: Atraumatic.  Eyes:     Extraocular Movements: Extraocular movements intact.     Conjunctiva/sclera: Conjunctivae normal.  Cardiovascular:     Rate and Rhythm: Normal rate and regular rhythm.     Heart sounds: Normal heart sounds.    Pulmonary:     Effort: Pulmonary effort is normal.     Breath sounds: Normal breath sounds.  Musculoskeletal:        General: Normal range of motion.     Cervical back: Normal range of motion and neck supple.  Skin:    General: Skin is warm.     Findings: Rash (scabbed over shingles rash, worst on right side of scalp with some erythema at base) present.  Neurological:     Mental Status: She is alert and oriented to person, place, and time.  Psychiatric:        Mood and Affect: Mood normal.        Thought Content: Thought content normal.        Judgment: Judgment normal.     Results for orders placed or performed during the hospital encounter of 09/01/15  CBC with Differential  Result Value Ref Range   WBC 9.1 3.6 - 11.0 K/uL   RBC 4.25 3.80 - 5.20 MIL/uL   Hemoglobin 13.2 12.0 - 16.0 g/dL   HCT 12/15/19 10/30/15 - 50.3 %   MCV 92.5 80.0 - 100.0 fL   MCH 31.1 26.0 - 34.0 pg   MCHC 33.6 32.0 - 36.0 g/dL   RDW 54.6 56.8 - 12.7 %  Platelets 156 150 - 440 K/uL   Neutrophils Relative % 85 %   Neutro Abs 7.7 (H) 1.4 - 6.5 K/uL   Lymphocytes Relative 8 %   Lymphs Abs 0.7 (L) 1.0 - 3.6 K/uL   Monocytes Relative 6 %   Monocytes Absolute 0.5 0.2 - 0.9 K/uL   Eosinophils Relative 0 %   Eosinophils Absolute 0.0 0 - 0.7 K/uL   Basophils Relative 1 %   Basophils Absolute 0.1 0 - 0.1 K/uL  Basic metabolic panel  Result Value Ref Range   Sodium 138 135 - 145 mmol/L   Potassium 4.3 3.5 - 5.1 mmol/L   Chloride 106 101 - 111 mmol/L   CO2 26 22 - 32 mmol/L   Glucose, Bld 105 (H) 65 - 99 mg/dL   BUN 20 6 - 20 mg/dL   Creatinine, Ser 0.89 0.44 - 1.00 mg/dL   Calcium 9.2 8.9 - 10.3 mg/dL   GFR calc non Af Amer >60 >60 mL/min   GFR calc Af Amer >60 >60 mL/min   Anion gap 6 5 - 15      Assessment & Plan:   Problem List Items Addressed This Visit    None    Visit Diagnoses    Herpes zoster without complication    -  Primary   Mildly improved but still significantly painful. Will restart  more valtrex, prednisone, and continue prn gabapentin and oxycodone (precautions reviewed)   Relevant Medications   valACYclovir (VALTREX) 1000 MG tablet       Follow up plan: Return for as scheduled.

## 2019-12-30 ENCOUNTER — Telehealth: Payer: Medicare Other | Admitting: Nurse Practitioner

## 2019-12-31 ENCOUNTER — Ambulatory Visit: Payer: Medicare Other | Admitting: Family Medicine

## 2019-12-31 ENCOUNTER — Ambulatory Visit (INDEPENDENT_AMBULATORY_CARE_PROVIDER_SITE_OTHER): Payer: Medicare Other | Admitting: Family Medicine

## 2019-12-31 ENCOUNTER — Other Ambulatory Visit: Payer: Self-pay

## 2019-12-31 ENCOUNTER — Encounter: Payer: Self-pay | Admitting: Family Medicine

## 2019-12-31 VITALS — BP 175/102 | HR 100 | Temp 97.5°F | Wt 300.0 lb

## 2019-12-31 DIAGNOSIS — F419 Anxiety disorder, unspecified: Secondary | ICD-10-CM | POA: Diagnosis not present

## 2019-12-31 DIAGNOSIS — B029 Zoster without complications: Secondary | ICD-10-CM

## 2019-12-31 MED ORDER — HYDROXYZINE HCL 25 MG PO TABS: 25.0000 mg | ORAL_TABLET | Freq: Three times a day (TID) | ORAL | 0 refills | Status: DC | PRN

## 2019-12-31 NOTE — Progress Notes (Signed)
BP (!) 175/102 (BP Location: Left Arm, Patient Position: Sitting, Cuff Size: Normal)   Pulse 100   Temp (!) 97.5 F (36.4 C) (Oral)   Wt 300 lb (136.1 kg)   SpO2 97%   BMI 49.92 kg/m    Subjective:    Patient ID: [name], female    DOB: 11-25-1950, 68 y.o.   MRN: [id number]  HPI: [name] is a 69 y.o. female  Chief Complaint  Patient presents with  . note of clearance    stating Shingles is not contagious for ICU.    [name] presents today needing a note stating that her shingles is not contagious so that she can see her son in the ICU. She was diagnosed by the ER on 12/13/19. Her rash has been really persistent and she has undergone 3 rounds of prednisone and valtrex. Her 3rd round of anti-viral and prednisone was just started 3 days ago. She continues with a lot of pain. She has been rubbing her rash. She is very upset and moaning and crying in the room. She is barely able to get any words out. Her daughter is with her and notes that she has been under a lot of stress as her son had a massive heart attack and is in the ICU.  Relevant past medical, surgical, family and social history reviewed and updated as indicated. Interim medical history since our last visit reviewed. Allergies and medications reviewed and updated.  Review of Systems  Constitutional: Negative.   Respiratory: Negative.   Cardiovascular: Negative.   Skin: Positive for rash. Negative for color change, pallor and wound.  Neurological: Positive for headaches. Negative for dizziness, tremors, seizures, syncope, facial asymmetry, speech difficulty, weakness, light-headedness and numbness.  Psychiatric/Behavioral: Positive for agitation and dysphoric mood. Negative for behavioral problems, confusion, decreased concentration, hallucinations, self-injury, sleep disturbance and suicidal ideas. The patient is nervous/anxious. The patient is not hyperactive.     Per HPI unless specifically indicated  above     Objective:    BP (!) 175/102 (BP Location: Left Arm, Patient Position: Sitting, Cuff Size: Normal)   Pulse 100   Temp (!) 97.5 F (36.4 C) (Oral)   Wt 300 lb (136.1 kg)   SpO2 97%   BMI 49.92 kg/m   Wt Readings from Last 3 Encounters:  12/31/19 300 lb (136.1 kg)  12/28/19 300 lb (136.1 kg)  12/18/19 (!) 302 lb (137 kg)    Physical Exam Vitals and nursing note reviewed.  Constitutional:      General: She is not in acute distress.    Appearance: Normal appearance. She is obese. She is not ill-appearing, toxic-appearing or diaphoretic.     Comments: Moaning and crying intensely   HENT:     Head: Normocephalic and atraumatic.     Right Ear: External ear normal.     Left Ear: External ear normal.     Nose: Nose normal.     Mouth/Throat:     Mouth: Mucous membranes are moist.     Pharynx: Oropharynx is clear.  Eyes:     General: No scleral icterus.       Right eye: No discharge.        Left eye: No discharge.     Extraocular Movements: Extraocular movements intact.     Conjunctiva/sclera: Conjunctivae normal.     Pupils: Pupils are equal, round, and reactive to light.  Cardiovascular:     Rate and Rhythm: Normal rate and regular rhythm.  Pulses: Normal pulses.     Heart sounds: Normal heart sounds. No murmur heard.  No friction rub. No gallop.   Pulmonary:     Effort: Pulmonary effort is normal. No respiratory distress.     Breath sounds: Normal breath sounds. No stridor. No wheezing, rhonchi or rales.  Chest:     Chest wall: No tenderness.  Musculoskeletal:        General: Normal range of motion.     Cervical back: Normal range of motion and neck supple.  Skin:    General: Skin is warm and dry.     Capillary Refill: Capillary refill takes less than 2 seconds.     Coloration: Skin is not jaundiced or pale.     Findings: Rash present. No bruising, erythema or lesion.     Comments: Excoriated erythematous rash on back of R side of her head and on top, no  sign of vessicles anywhere in the rash, some small ulcers, very tender to even light palpation  Neurological:     General: No focal deficit present.     Mental Status: She is alert and oriented to person, place, and time. Mental status is at baseline.  Psychiatric:        Mood and Affect: Mood normal.        Behavior: Behavior normal.        Thought Content: Thought content normal.        Judgment: Judgment normal.     Results for orders placed or performed during the hospital encounter of 09/01/15  CBC with Differential  Result Value Ref Range   WBC 9.1 3.6 - 11.0 K/uL   RBC 4.25 3.80 - 5.20 MIL/uL   Hemoglobin 13.2 12.0 - 16.0 g/dL   HCT 10.9 35 - 47 %   MCV 92.5 80.0 - 100.0 fL   MCH 31.1 26.0 - 34.0 pg   MCHC 33.6 32.0 - 36.0 g/dL   RDW 32.3 55.7 - 32.2 %   Platelets 156 150 - 440 K/uL   Neutrophils Relative % 85 %   Neutro Abs 7.7 (H) 1.4 - 6.5 K/uL   Lymphocytes Relative 8 %   Lymphs Abs 0.7 (L) 1.0 - 3.6 K/uL   Monocytes Relative 6 %   Monocytes Absolute 0.5 0 - 0 K/uL   Eosinophils Relative 0 %   Eosinophils Absolute 0.0 0 - 0 K/uL   Basophils Relative 1 %   Basophils Absolute 0.1 0 - 0 K/uL  Basic metabolic panel  Result Value Ref Range   Sodium 138 135 - 145 mmol/L   Potassium 4.3 3.5 - 5.1 mmol/L   Chloride 106 101 - 111 mmol/L   CO2 26 22 - 32 mmol/L   Glucose, Bld 105 (H) 65 - 99 mg/dL   BUN 20 6 - 20 mg/dL   Creatinine, Ser 0.25 0.44 - 1.00 mg/dL   Calcium 9.2 8.9 - 42.7 mg/dL   GFR calc non Af Amer >60 >60 mL/min   GFR calc Af Amer >60 >60 mL/min   Anion gap 6 5 - 15      Assessment & Plan:   Problem List Items Addressed This Visit    None    Visit Diagnoses    Herpes zoster without complication    -  Primary   No vesicles. Rash appears crusted over, but is still present. Finish treatment and follow up as scheduled. Not as able provided.    Acute anxiety  Rx for hydroxyzine given to help with acute anxiety. Call with any concerns. Follow  up as scheduled.    Relevant Medications   hydrOXYzine (ATARAX/VISTARIL) 25 MG tablet       Follow up plan: Return As scheduled.

## 2020-01-19 ENCOUNTER — Encounter: Payer: Self-pay | Admitting: Family Medicine

## 2020-01-19 ENCOUNTER — Ambulatory Visit (INDEPENDENT_AMBULATORY_CARE_PROVIDER_SITE_OTHER): Payer: Medicare Other | Admitting: Family Medicine

## 2020-01-19 ENCOUNTER — Other Ambulatory Visit: Payer: Self-pay

## 2020-01-19 VITALS — BP 144/69 | HR 91 | Temp 98.1°F | Ht 64.0 in | Wt 303.0 lb

## 2020-01-19 DIAGNOSIS — Z780 Asymptomatic menopausal state: Secondary | ICD-10-CM

## 2020-01-19 DIAGNOSIS — Z1211 Encounter for screening for malignant neoplasm of colon: Secondary | ICD-10-CM | POA: Diagnosis not present

## 2020-01-19 DIAGNOSIS — Z1159 Encounter for screening for other viral diseases: Secondary | ICD-10-CM

## 2020-01-19 DIAGNOSIS — F4321 Adjustment disorder with depressed mood: Secondary | ICD-10-CM

## 2020-01-19 DIAGNOSIS — Z6841 Body Mass Index (BMI) 40.0 and over, adult: Secondary | ICD-10-CM

## 2020-01-19 DIAGNOSIS — B029 Zoster without complications: Secondary | ICD-10-CM | POA: Diagnosis not present

## 2020-01-19 DIAGNOSIS — Z1231 Encounter for screening mammogram for malignant neoplasm of breast: Secondary | ICD-10-CM

## 2020-01-19 DIAGNOSIS — Z0000 Encounter for general adult medical examination without abnormal findings: Secondary | ICD-10-CM

## 2020-01-19 DIAGNOSIS — R030 Elevated blood-pressure reading, without diagnosis of hypertension: Secondary | ICD-10-CM | POA: Diagnosis not present

## 2020-01-19 MED ORDER — GABAPENTIN 600 MG PO TABS: 600.0000 mg | ORAL_TABLET | Freq: Three times a day (TID) | ORAL | 2 refills | Status: DC | PRN

## 2020-01-19 MED ORDER — ALPRAZOLAM 0.5 MG PO TABS
0.5000 mg | ORAL_TABLET | Freq: Every day | ORAL | 0 refills | Status: DC | PRN
Start: 2020-01-19 — End: 2020-03-03

## 2020-01-19 NOTE — Patient Instructions (Signed)
Please call at this number ([facility]) to schedule your mammogram. [phone] 

## 2020-01-19 NOTE — Progress Notes (Signed)
BP (!) 144/69 Comment: right lower arm  Pulse 91   Temp 98.1 F (36.7 C) (Oral)   Ht 5\' 4"  (1.626 m)   Wt (!) 303 lb (137.4 kg)   SpO2 98%   BMI 52.01 kg/m    Subjective:    Patient ID: [name], female    DOB: 1951/05/21, 69 y.o.   MRN: [id number]  HPI: Sue SantosShirley A Stella is a 69 y.o. female presenting on 01/19/2020 for comprehensive medical examination. Current medical complaints include:see below  Still dealing with severe shingles outbreak on right side of scalp down right side of face and neck. Rash slowly improving after several rounds of prednisone and valtrex. Taking gabapentin for pain control but still having severe breakthrough pain and cannot lay her head down on that side.   Just lost her son suddenly to a heart attack a week or so ago, dealing with intense grief since. Having trouble sleeping or relaxing, crying nearly constantly. Has not been working with grief counseling at all. Does have a solid support system. Denies SI/HI.   [facility] 12/18/2019  Decreased Interest 0  Down, Depressed, Hopeless 0  PHQ - 2 Score 0   GAD 7 : Generalized Anxiety Score 12/18/2019  Nervous, Anxious, on Edge 2  Control/stop worrying 0  Worry too much - different things 3  Trouble relaxing 0  Restless 0  Easily annoyed or irritable 1  Afraid - awful might happen 0  Total GAD 7 Score 6     She currently lives with: Menopausal Symptoms: no  Depression Screen done today and results listed below:  [facility]  Decreased Interest 0  Down, Depressed, Hopeless 0  PHQ - 2 Score 0    The patient does not have a history of falls. I did complete a risk assessment for falls. A plan of care for falls was documented.   Past Medical History:  Past Medical History:  Diagnosis Date  . Knee pain   . Migraines     Surgical History:  Past Surgical History:  Procedure Laterality Date  . REPLACEMENT TOTAL KNEE     left knee  . TUBAL  LIGATION      Medications:  Current Outpatient Medications on File Prior to Visit  Medication Sig  . Acetaminophen (TYLENOL 8 HOUR PO) Take by mouth as needed.  . diclofenac Sodium (VOLTAREN) 1 % GEL Apply topically 3 (three) times daily.    No current facility-administered medications on file prior to visit.    Allergies:  No Known Allergies  Social History:  Social History   Socioeconomic History  . Marital status: Widowed    Spouse name: Not on file  . Number of children: Not on file  . Years of education: Not on file  . Highest education level: Not on file  Occupational History  . Not on file  Tobacco Use  . Smoking status: Never Smoker  . Smokeless tobacco: Never Used  Vaping Use  . Vaping Use: Never used  Substance and Sexual Activity  . Alcohol use: No  . Drug use: No  . Sexual activity: Not Currently  Other Topics Concern  . Not on file  Social History Narrative  . Not on file   Social Determinants of Health   Financial Resource Strain:   . Difficulty of Paying Living Expenses:   Food Insecurity:   . Worried About Programme researcher, broadcasting/film/videounning Out of Food in the Last Year:   . Ran  Out of Food in the Last Year:   Transportation Needs:   . Lack of Transportation (Medical):   Marland Kitchen Lack of Transportation (Non-Medical):   Physical Activity:   . Days of Exercise per Week:   . Minutes of Exercise per Session:   Stress:   . Feeling of Stress :   Social Connections:   . Frequency of Communication with Friends and Family:   . Frequency of Social Gatherings with Friends and Family:   . Attends Religious Services:   . Active Member of Clubs or Organizations:   . Attends Banker Meetings:   Marland Kitchen Marital Status:   Intimate Partner Violence:   . Fear of Current or Ex-Partner:   . Emotionally Abused:   Marland Kitchen Physically Abused:   . Sexually Abused:    Social History   Tobacco Use  Smoking Status Never Smoker  Smokeless Tobacco Never Used   Social History   Substance  and Sexual Activity  Alcohol Use No    Family History:  Family History  Problem Relation Age of Onset  . Heart disease Mother   . Hypertension Mother   . Cancer Father   . Cancer Brother     Past medical history, surgical history, medications, allergies, family history and social history reviewed with patient today and changes made to appropriate areas of the chart.   Review of Systems - General ROS: negative Psychological ROS: positive for - anxiety and grief Ophthalmic ROS: negative ENT ROS: negative Allergy and Immunology ROS: negative Hematological and Lymphatic ROS: negative Endocrine ROS: negative Breast ROS: negative for breast lumps Respiratory ROS: no cough, shortness of breath, or wheezing Cardiovascular ROS: no chest pain or dyspnea on exertion Gastrointestinal ROS: no abdominal pain, change in bowel habits, or black or bloody stools Genito-Urinary ROS: no dysuria, trouble voiding, or hematuria Musculoskeletal ROS: negative Neurological ROS: no TIA or stroke symptoms Dermatological ROS: positive for rash All other ROS negative except what is listed above and in the HPI.      Objective:    BP (!) 144/69 Comment: right lower arm  Pulse 91   Temp 98.1 F (36.7 C) (Oral)   Ht 5\' 4"  (1.626 m)   Wt (!) 303 lb (137.4 kg)   SpO2 98%   BMI 52.01 kg/m   Wt Readings from Last 3 Encounters:  01/19/20 (!) 303 lb (137.4 kg)  12/31/19 300 lb (136.1 kg)  12/28/19 300 lb (136.1 kg)    Physical Exam Vitals and nursing note reviewed.  Constitutional:      General: She is not in acute distress.    Appearance: She is well-developed. She is obese.  HENT:     Head: Atraumatic.     Right Ear: External ear normal.     Left Ear: External ear normal.     Nose: Nose normal.     Mouth/Throat:     Pharynx: No oropharyngeal exudate.  Eyes:     General: No scleral icterus.    Conjunctiva/sclera: Conjunctivae normal.     Pupils: Pupils are equal, round, and reactive to  light.  Neck:     Thyroid: No thyromegaly.  Cardiovascular:     Rate and Rhythm: Normal rate and regular rhythm.     Heart sounds: Normal heart sounds.  Pulmonary:     Effort: Pulmonary effort is normal. No respiratory distress.     Breath sounds: Normal breath sounds.  Abdominal:     General: Bowel sounds are normal.  Palpations: Abdomen is soft. There is no mass.     Tenderness: There is no abdominal tenderness.  Musculoskeletal:        General: No tenderness. Normal range of motion.     Cervical back: Normal range of motion and neck supple.  Lymphadenopathy:     Cervical: No cervical adenopathy.  Skin:    General: Skin is warm and dry.     Findings: Rash (well healing scabbed over herpes zoster rash right scalp, right side of face and neck) present.  Neurological:     Mental Status: She is alert and oriented to person, place, and time.     Cranial Nerves: No cranial nerve deficit.  Psychiatric:        Behavior: Behavior normal.     Comments: tearful     Results for orders placed or performed in visit on 01/19/20  Microscopic Examination   Urine  Result Value Ref Range   WBC, UA 11-30 (A) 0 - 5 /hpf   RBC 3-10 (A) 0 - 2 /hpf   Epithelial Cells (non renal) 0-10 0 - 10 /hpf   Mucus, UA Present Not Estab.   Bacteria, UA Few (A) None seen/Few  Urine Culture, Reflex   Urine  Result Value Ref Range   Urine Culture, Routine WILL FOLLOW   UA/M w/rflx Culture, Routine   Specimen: Urine   Urine  Result Value Ref Range   Specific Gravity, UA >1.030 (H) 1.005 - 1.030   pH, UA 5.0 5.0 - 7.5   Color, UA Yellow Yellow   Appearance Ur Cloudy (A) Clear   Leukocytes,UA 2+ (A) Negative   Protein,UA 1+ (A) Negative/Trace   Glucose, UA Negative Negative   Ketones, UA Negative Negative   RBC, UA 1+ (A) Negative   Bilirubin, UA Negative Negative   Urobilinogen, Ur 1.0 0.2 - 1.0 mg/dL   Nitrite, UA Negative Negative   Microscopic Examination See below:    Urinalysis Reflex  Comment       Assessment & Plan:   Problem List Items Addressed This Visit      Other   Obesity   Relevant Orders   Lipid Panel w/o Chol/HDL Ratio   TSH   Elevated blood pressure reading   Relevant Orders   CBC with Differential/Platelet   Comprehensive metabolic panel   UA/M w/rflx Culture, Routine (Completed)    Other Visit Diagnoses    Grief    -  Primary   Declines daily medicine or grief counseling. Small supply of prn xanax given with strict precautions. This should last several months   Annual physical exam       Herpes zoster without complication       Rash slowly resolving, but pain persists. Increase gabapentin to 600 mg TID prn and continue prn OTC pain relievers. F/u in 6 weeks for recheck pain   Colon cancer screening       Relevant Orders   Ambulatory referral to Gastroenterology   Need for hepatitis C screening test       Relevant Orders   Hepatitis C antibody   Encounter for screening mammogram for malignant neoplasm of breast       Relevant Orders   MM DIGITAL SCREENING BILATERAL   Postmenopausal estrogen deficiency       Relevant Orders   DG DXA BODY COMPOSITION       Follow up plan: Return in about 6 weeks (around 03/01/2020) for Grief, shingles f/u.   LABORATORY TESTING:  -  Pap smear: not applicable  IMMUNIZATIONS:   - Tdap: Tetanus vaccination status reviewed: postponed. - Influenza: Postponed to flu season - Pneumovax: postponed - Prevnar: postponed - HPV: Not applicable - Zostavax vaccine: postponed due to active shingles infection  SCREENING: -Mammogram: Ordered today  - Colonoscopy: Ordered today  - Bone Density: Ordered today   PATIENT COUNSELING:   Advised to take 1 mg of folate supplement per day if capable of pregnancy.   Sexuality: Discussed sexually transmitted diseases, partner selection, use of condoms, avoidance of unintended pregnancy  and contraceptive alternatives.   Advised to avoid cigarette smoking.  I discussed  with the patient that most people either abstain from alcohol or drink within safe limits (<=14/week and <=4 drinks/occasion for males, <=7/weeks and <= 3 drinks/occasion for females) and that the risk for alcohol disorders and other health effects rises proportionally with the number of drinks per week and how often a drinker exceeds daily limits.  Discussed cessation/primary prevention of drug use and availability of treatment for abuse.   Diet: Encouraged to adjust caloric intake to maintain  or achieve ideal body weight, to reduce intake of dietary saturated fat and total fat, to limit sodium intake by avoiding high sodium foods and not adding table salt, and to maintain adequate dietary potassium and calcium preferably from fresh fruits, vegetables, and low-fat dairy products.    stressed the importance of regular exercise  Injury prevention: Discussed safety belts, safety helmets, smoke detector, smoking near bedding or upholstery.   Dental health: Discussed importance of regular tooth brushing, flossing, and dental visits.    NEXT PREVENTATIVE PHYSICAL DUE IN 1 YEAR. Return in about 6 weeks (around 03/01/2020) for Grief, shingles f/u.

## 2020-01-20 LAB — COMPREHENSIVE METABOLIC PANEL
ALT: 8 IU/L (ref 0–32)
AST: 12 IU/L (ref 0–40)
Albumin/Globulin Ratio: 1.8 (ref 1.2–2.2)
Albumin: 4.2 g/dL (ref 3.8–4.8)
Alkaline Phosphatase: 66 IU/L (ref 48–121)
BUN/Creatinine Ratio: 11 — ABNORMAL LOW (ref 12–28)
BUN: 11 mg/dL (ref 8–27)
Bilirubin Total: 0.5 mg/dL (ref 0.0–1.2)
CO2: 23 mmol/L (ref 20–29)
Calcium: 9.4 mg/dL (ref 8.7–10.3)
Chloride: 104 mmol/L (ref 96–106)
Creatinine, Ser: 0.96 mg/dL (ref 0.57–1.00)
GFR calc Af Amer: 70 mL/min/{1.73_m2} (ref >59)
GFR calc non Af Amer: 61 mL/min/{1.73_m2} (ref >59)
Globulin, Total: 2.3 g/dL (ref 1.5–4.5)
Glucose: 86 mg/dL (ref 65–99)
Potassium: 4 mmol/L (ref 3.5–5.2)
Sodium: 142 mmol/L (ref 134–144)
Total Protein: 6.5 g/dL (ref 6.0–8.5)

## 2020-01-20 LAB — CBC WITH DIFFERENTIAL/PLATELET
Basophils Absolute: 0 10*3/uL (ref 0.0–0.2)
Basos: 1 %
EOS (ABSOLUTE): 0.2 10*3/uL (ref 0.0–0.4)
Eos: 4 %
Hematocrit: 43.3 % (ref 34.0–46.6)
Hemoglobin: 14.1 g/dL (ref 11.1–15.9)
Immature Grans (Abs): 0 10*3/uL (ref 0.0–0.1)
Immature Granulocytes: 0 %
Lymphocytes Absolute: 0.8 10*3/uL (ref 0.7–3.1)
Lymphs: 18 %
MCH: 32.2 pg (ref 26.6–33.0)
MCHC: 32.6 g/dL (ref 31.5–35.7)
MCV: 99 fL — ABNORMAL HIGH (ref 79–97)
Monocytes Absolute: 0.5 10*3/uL (ref 0.1–0.9)
Monocytes: 12 %
Neutrophils Absolute: 3 10*3/uL (ref 1.4–7.0)
Neutrophils: 65 %
Platelets: 253 10*3/uL (ref 150–450)
RBC: 4.38 x10E6/uL (ref 3.77–5.28)
RDW: 13.9 % (ref 11.7–15.4)
WBC: 4.5 10*3/uL (ref 3.4–10.8)

## 2020-01-20 LAB — LIPID PANEL W/O CHOL/HDL RATIO
Cholesterol, Total: 191 mg/dL (ref 100–199)
HDL: 49 mg/dL (ref >39)
LDL Chol Calc (NIH): 119 mg/dL — ABNORMAL HIGH (ref 0–99)
Triglycerides: 126 mg/dL (ref 0–149)
VLDL Cholesterol Cal: 23 mg/dL (ref 5–40)

## 2020-01-20 LAB — HEPATITIS C ANTIBODY: Hep C Virus Ab: <0.1 s/co ratio (ref 0.0–0.9)

## 2020-01-20 LAB — TSH: TSH: 2.73 u[IU]/mL (ref 0.450–4.500)

## 2020-01-21 LAB — UA/M W/RFLX CULTURE, ROUTINE
Bilirubin, UA: NEGATIVE
Glucose, UA: NEGATIVE
Ketones, UA: NEGATIVE
Nitrite, UA: NEGATIVE
Specific Gravity, UA: >1.03 — ABNORMAL HIGH (ref 1.005–1.030)
Urobilinogen, Ur: 1 mg/dL (ref 0.2–1.0)
pH, UA: 5 (ref 5.0–7.5)

## 2020-01-21 LAB — MICROSCOPIC EXAMINATION

## 2020-01-21 LAB — URINE CULTURE, REFLEX

## 2020-01-22 ENCOUNTER — Telehealth: Payer: Self-pay | Admitting: Family Medicine

## 2020-01-22 NOTE — Telephone Encounter (Signed)
Copied from CRM [id number]. Topic: Medicare AWV >> Jan 22, 2020 11:23 AM Claudette Laws R wrote: Reason for CRM:  Left message for patient to call back and schedule Medicare Annual Wellness Visit (AWV) to be done virtually.  No hx of AWV per [facility] AWV-I due 08/23/2017  Please schedule at anytime with [facility]-Nurse Health Advisor.      45 Minutes appointment

## 2020-02-02 ENCOUNTER — Other Ambulatory Visit: Payer: Self-pay

## 2020-02-02 ENCOUNTER — Telehealth (INDEPENDENT_AMBULATORY_CARE_PROVIDER_SITE_OTHER): Payer: Self-pay | Admitting: Gastroenterology

## 2020-02-02 ENCOUNTER — Telehealth: Payer: Self-pay | Admitting: Family Medicine

## 2020-02-02 DIAGNOSIS — Z1211 Encounter for screening for malignant neoplasm of colon: Secondary | ICD-10-CM

## 2020-02-02 MED ORDER — NA SULFATE-K SULFATE-MG SULF 17.5-3.13-1.6 GM/177ML PO SOLN: 1.0000 | Freq: Once | ORAL | 0 refills | Status: AC

## 2020-02-02 NOTE — Telephone Encounter (Signed)
Individual has been contacted regarding ED referral and has stated she is not interested in becoming a pt. No further attempts to contact individual will be made. 

## 2020-02-02 NOTE — Progress Notes (Signed)
Gastroenterology Pre-Procedure Review  Request Date: Friday 02/19/20 Requesting Physician: Dr. Bonna Gains  PATIENT REVIEW QUESTIONS: The patient responded to the following health history questions as indicated:    1. Are you having any GI issues? no 2. Do you have a personal history of Polyps? no 3. Do you have a family history of Colon Cancer or Polyps? no 4. Diabetes Mellitus? no 5. Joint replacements in the past 12 months?no 6. Major health problems in the past 3 months?01/04/20 Neuralgia 7. Any artificial heart valves, MVP, or defibrillator?no    MEDICATIONS & ALLERGIES:    Patient reports the following regarding taking any anticoagulation/antiplatelet therapy:   Plavix, Coumadin, Eliquis, Xarelto, Lovenox, Pradaxa, Brilinta, or Effient? no Aspirin? no  Patient confirms/reports the following medications:  Current Outpatient Medications  Medication Sig Dispense Refill  . Acetaminophen (TYLENOL 8 HOUR PO) Take by mouth as needed.    . ALPRAZolam (XANAX) 0.5 MG tablet Take 1 tablet (0.5 mg total) by mouth daily as needed for anxiety. 30 tablet 0  . diclofenac Sodium (VOLTAREN) 1 % GEL Apply topically 3 (three) times daily.     Marland Kitchen gabapentin (NEURONTIN) 600 MG tablet Take 1 tablet (600 mg total) by mouth 3 (three) times daily as needed. 90 tablet 2  . Na Sulfate-K Sulfate-Mg Sulf 17.5-3.13-1.6 GM/177ML SOLN Take 1 kit by mouth once for 1 dose. 354 mL 0   No current facility-administered medications for this visit.    Patient confirms/reports the following allergies:  No Known Allergies  No orders of the defined types were placed in this encounter.   AUTHORIZATION INFORMATION Primary Insurance: 1D#: Group #:  Secondary Insurance: 1D#: Group #:  SCHEDULE INFORMATION: Date: Friday 02/19/20 Time: Location:[facility]

## 2020-02-15 ENCOUNTER — Ambulatory Visit
Admission: RE | Admit: 2020-02-15 | Discharge: 2020-02-15 | Disposition: A | Discharge status: Home / Self Care | Payer: Medicare Other | Source: Ambulatory Visit | Attending: Family Medicine | Admitting: Family Medicine

## 2020-02-15 DIAGNOSIS — Z1231 Encounter for screening mammogram for malignant neoplasm of breast: Secondary | ICD-10-CM

## 2020-02-17 ENCOUNTER — Other Ambulatory Visit
Admission: RE | Admit: 2020-02-17 | Discharge: 2020-02-17 | Disposition: A | Discharge status: Home / Self Care | Payer: Medicare Other | Source: Ambulatory Visit | Attending: Gastroenterology | Admitting: Gastroenterology

## 2020-02-17 ENCOUNTER — Other Ambulatory Visit: Payer: Self-pay

## 2020-02-17 DIAGNOSIS — Z20822 Contact with and (suspected) exposure to covid-19: Secondary | ICD-10-CM | POA: Diagnosis not present

## 2020-02-17 DIAGNOSIS — Z01812 Encounter for preprocedural laboratory examination: Secondary | ICD-10-CM | POA: Diagnosis present

## 2020-02-17 LAB — SARS CORONAVIRUS 2 (TAT 6-24 HRS): SARS Coronavirus 2: NEGATIVE

## 2020-02-19 ENCOUNTER — Ambulatory Visit: Payer: Medicare Other | Admitting: Certified Registered"

## 2020-02-19 ENCOUNTER — Other Ambulatory Visit: Payer: Self-pay

## 2020-02-19 ENCOUNTER — Ambulatory Visit
Admission: RE | Admit: 2020-02-19 | Discharge: 2020-02-19 | Disposition: A | Discharge status: Home / Self Care | Payer: Medicare Other | Attending: Gastroenterology | Admitting: Gastroenterology

## 2020-02-19 ENCOUNTER — Encounter
Admission: RE | Disposition: A | Discharge status: Home / Self Care | Payer: Self-pay | Source: Home / Self Care | Attending: Gastroenterology

## 2020-02-19 ENCOUNTER — Encounter: Payer: Self-pay | Admitting: Gastroenterology

## 2020-02-19 DIAGNOSIS — Z6841 Body Mass Index (BMI) 40.0 and over, adult: Secondary | ICD-10-CM | POA: Diagnosis not present

## 2020-02-19 DIAGNOSIS — Z791 Long term (current) use of non-steroidal anti-inflammatories (NSAID): Secondary | ICD-10-CM | POA: Insufficient documentation

## 2020-02-19 DIAGNOSIS — Z96652 Presence of left artificial knee joint: Secondary | ICD-10-CM | POA: Diagnosis not present

## 2020-02-19 DIAGNOSIS — Z1211 Encounter for screening for malignant neoplasm of colon: Secondary | ICD-10-CM | POA: Diagnosis not present

## 2020-02-19 DIAGNOSIS — Z79899 Other long term (current) drug therapy: Secondary | ICD-10-CM | POA: Diagnosis not present

## 2020-02-19 HISTORY — DX: Personal history of other infectious and parasitic diseases: Z86.19

## 2020-02-19 HISTORY — PX: COLONOSCOPY WITH PROPOFOL: SHX5780

## 2020-02-19 SURGERY — COLONOSCOPY WITH PROPOFOL
Anesthesia: General

## 2020-02-19 MED ORDER — LIDOCAINE HCL (CARDIAC) PF 100 MG/5ML IV SOSY
PREFILLED_SYRINGE | INTRAVENOUS | Status: DC | PRN
  Administered 2020-02-19: 100 mg via INTRAVENOUS

## 2020-02-19 MED ORDER — SODIUM CHLORIDE 0.9 % IV SOLN: INTRAVENOUS | Status: DC

## 2020-02-19 MED ORDER — PROPOFOL 500 MG/50ML IV EMUL
INTRAVENOUS | Status: AC
  Filled 2020-02-19: qty 50

## 2020-02-19 MED ORDER — PROPOFOL 10 MG/ML IV BOLUS
INTRAVENOUS | Status: AC
  Filled 2020-02-19: qty 20

## 2020-02-19 MED ORDER — PROPOFOL 500 MG/50ML IV EMUL
INTRAVENOUS | Status: DC | PRN
  Administered 2020-02-19: 99 ug/kg/min via INTRAVENOUS

## 2020-02-19 MED ORDER — LIDOCAINE HCL (PF) 2 % IJ SOLN
INTRAMUSCULAR | Status: AC
  Filled 2020-02-19: qty 5

## 2020-02-19 NOTE — Anesthesia Preprocedure Evaluation (Addendum)
Anesthesia Evaluation  Patient identified by MRN, date of birth, ID band Patient awake    Reviewed: Allergy & Precautions, H&P , NPO status , Patient's Chart, lab work & pertinent test results  Airway Mallampati: III  TM Distance: >3 FB Neck ROM: full    Dental   Pulmonary neg COPD,    Pulmonary exam normal        Cardiovascular (-) angina(-) Past MI negative cardio ROS Normal cardiovascular exam(-) dysrhythmias      Neuro/Psych  Headaches, negative psych ROS   GI/Hepatic negative GI ROS, Neg liver ROS,   Endo/Other  Morbid obesity  Renal/GU negative Renal ROS  negative genitourinary   Musculoskeletal   Abdominal   Peds  Hematology negative hematology ROS (+)   Anesthesia Other Findings Past Medical History: No date: History of shingles No date: Knee pain No date: Migraines  Past Surgical History: No date: REPLACEMENT TOTAL KNEE     Comment:  left knee No date: TUBAL LIGATION  BMI    Body Mass Index: 49.92 kg/m      Reproductive/Obstetrics negative OB ROS                            Anesthesia Physical Anesthesia Plan  ASA: III  Anesthesia Plan: General   Post-op Pain Management:    Induction:   PONV Risk Score and Plan: Propofol infusion and TIVA  Airway Management Planned:   Additional Equipment:   Intra-op Plan:   Post-operative Plan:   Informed Consent: I have reviewed the patients History and Physical, chart, labs and discussed the procedure including the risks, benefits and alternatives for the proposed anesthesia with the patient or authorized representative who has indicated his/her understanding and acceptance.     Dental Advisory Given  Plan Discussed with: Anesthesiologist, CRNA and Surgeon  Anesthesia Plan Comments:         Anesthesia Quick Evaluation

## 2020-02-19 NOTE — H&P (Signed)
Melodie Bouillon, MD [address], [place], [place], [address] [address], [place], [place], [address] Phone: [phone]  Fax: [phone]  Primary Care Physician:  Particia Nearing, PA-C   Pre-Procedure History & Physical: HPI:  [name] is a 69 y.o. female is here for a colonoscopy.   Past Medical History:  Diagnosis Date  . History of shingles   . Knee pain   . Migraines     Past Surgical History:  Procedure Laterality Date  . REPLACEMENT TOTAL KNEE     left knee  . TUBAL LIGATION      Prior to Admission medications   Medication Sig Start Date End Date Taking? Authorizing Provider  Acetaminophen (TYLENOL 8 HOUR PO) Take by mouth as needed.   Yes [provider]  ALPRAZolam Prudy Feeler) 0.5 MG tablet Take 1 tablet (0.5 mg total) by mouth daily as needed for anxiety. 01/19/20  Yes Particia Nearing, PA-C  gabapentin (NEURONTIN) 600 MG tablet Take 1 tablet (600 mg total) by mouth 3 (three) times daily as needed. 01/19/20  Yes Particia Nearing, PA-C  diclofenac Sodium (VOLTAREN) 1 % GEL Apply topically 3 (three) times daily.  01/05/20 01/04/21  [provider]    Allergies as of 02/02/2020  . (No Known Allergies)    Family History  Problem Relation Age of Onset  . Heart disease Mother   . Hypertension Mother   . Cancer Father   . Cancer Brother     Social History   Socioeconomic History  . Marital status: Widowed    Spouse name: Not on file  . Number of children: Not on file  . Years of education: Not on file  . Highest education level: Not on file  Occupational History  . Not on file  Tobacco Use  . Smoking status: Never Smoker  . Smokeless tobacco: Never Used  Vaping Use  . Vaping Use: Never used  Substance and Sexual Activity  . Alcohol use: No  . Drug use: No  . Sexual activity: Not Currently  Other Topics Concern  . Not on file  Social History Narrative  . Not on file   Social  Determinants of Health   Financial Resource Strain:   . Difficulty of Paying Living Expenses:   Food Insecurity:   . Worried About Programme researcher, broadcasting/film/video in the Last Year:   . Barista in the Last Year:   Transportation Needs:   . Freight forwarder (Medical):   Marland Kitchen Lack of Transportation (Non-Medical):   Physical Activity:   . Days of Exercise per Week:   . Minutes of Exercise per Session:   Stress:   . Feeling of Stress :   Social Connections:   . Frequency of Communication with Friends and Family:   . Frequency of Social Gatherings with Friends and Family:   . Attends Religious Services:   . Active Member of Clubs or Organizations:   . Attends Banker Meetings:   Marland Kitchen Marital Status:   Intimate Partner Violence:   . Fear of Current or Ex-Partner:   . Emotionally Abused:   Marland Kitchen Physically Abused:   . Sexually Abused:     Review of Systems: See HPI, otherwise negative ROS  Physical Exam: There were no vitals taken for this visit. General:   Alert,  pleasant and cooperative in NAD Head:  Normocephalic and atraumatic. Neck:  Supple; no masses or thyromegaly. Lungs:  Clear throughout  to auscultation, normal respiratory effort.    Heart:  +S1, +S2, Regular rate and rhythm, No edema. Abdomen:  Soft, nontender and nondistended. Normal bowel sounds, without guarding, and without rebound.   Neurologic:  Alert and  oriented x4;  grossly normal neurologically.  Impression/Plan: [name] is here for a colonoscopy to be performed for average risk screening.  Risks, benefits, limitations, and alternatives regarding  colonoscopy have been reviewed with the patient.  Questions have been answered.  All parties agreeable.   Pasty Spillers, MD  02/19/2020, 10:37 AM

## 2020-02-19 NOTE — Anesthesia Procedure Notes (Signed)
Performed by: Ixchel Duck R, CRNA Oxygen Delivery Method: Supernova nasal CPAP       

## 2020-02-19 NOTE — Transfer of Care (Signed)
Immediate Anesthesia Transfer of Care Note  Patient: ALTA GODING  Procedure(s) Performed: COLONOSCOPY WITH PROPOFOL (N/A )  Patient Location: PACU and Endoscopy Unit  Anesthesia Type:General  Level of Consciousness: awake  Airway & Oxygen Therapy: Patient Spontanous Breathing and Patient connected to face mask oxygen  Post-op Assessment: Report given to RN and Post -op Vital signs reviewed and stable  Post vital signs: Reviewed and stable  Last Vitals:  Vitals Value Taken Time  BP    Temp    Pulse 82 02/19/20 1159  Resp 25 02/19/20 1159  SpO2 100 % 02/19/20 1159  Vitals shown include unvalidated device data.  Last Pain:  Vitals:   02/19/20 1047  TempSrc: Temporal  PainSc: 0-No pain         Complications: No complications documented.

## 2020-02-19 NOTE — Op Note (Signed)
[facility] Patient Name: [name] Procedure Date: 02/19/2020 11:30 AM MRN: [id number] Account #: [id number] Date of Birth: 1951-06-25 Admit Type: Outpatient Age: 69 Room: [facility] ENDO ROOM 4 Gender: Female Note Status: Finalized Procedure:             Colonoscopy Indications:           Screening for colorectal malignant neoplasm Providers:             Jovanne Riggenbach B. Maximino Greenland MD, MD Referring MD:          Bevely Palmer (Referring MD) Medicines:             Monitored Anesthesia Care Complications:         No immediate complications. Procedure:             Pre-Anesthesia Assessment:                        - Prior to the procedure, a History and Physical was                         performed, and patient medications, allergies and                         sensitivities were reviewed. The patient's tolerance                         of previous anesthesia was reviewed.                        - The risks and benefits of the procedure and the                         sedation options and risks were discussed with the                         patient. All questions were answered and informed                         consent was obtained.                        - Patient identification and proposed procedure were                         verified prior to the procedure by the physician, the                         nurse, the anesthetist and the technician. The                         procedure was verified in the pre-procedure area in                         the procedure room in the endoscopy suite.                        - ASA Grade Assessment: II - A patient with mild  systemic disease.                        - After reviewing the risks and benefits, the patient                         was deemed in satisfactory condition to undergo the                         procedure.                        After obtaining informed consent, the  colonoscope was                         passed under direct vision. Throughout the procedure,                         the patient's blood pressure, pulse, and oxygen                         saturations were monitored continuously. The                         Colonoscope was introduced through the anus and                         advanced to the the cecum, identified by appendiceal                         orifice and ileocecal valve. The colonoscopy was                         performed with ease. The patient tolerated the                         procedure well. The quality of the bowel preparation                         was fair. Findings:      The perianal and digital rectal examinations were normal.      The rectum, sigmoid colon, descending colon, transverse colon, ascending       colon and cecum appeared normal.      The retroflexed view of the distal rectum and anal verge was normal and       showed no anal or rectal abnormalities. Impression:            - Preparation of the colon was fair.                        - The rectum, sigmoid colon, descending colon,                         transverse colon, ascending colon and cecum are normal.                        - The distal rectum and anal verge are normal on  retroflexion view.                        - No specimens collected. Recommendation:        - Discharge patient to home.                        - Resume previous diet.                        - Continue present medications.                        - Repeat colonoscopy in 1-2 years, with 2 day prep,                         for screening purposes.                        - Return to primary care physician as previously                         scheduled.                        - The findings and recommendations were discussed with                         the patient.                        - The findings and recommendations were discussed with                          the patient's family. Procedure Code(s):     --- Professional ---                        [phone], Colonoscopy, flexible; diagnostic, including                         collection of specimen(s) by brushing or washing, when                         performed (separate procedure) Diagnosis Code(s):     --- Professional ---                        Z12.11, Encounter for screening for malignant neoplasm                         of colon CPT copyright 2019 American Medical Association. All rights reserved. The codes documented in this report are preliminary and upon coder review may  be revised to meet current compliance requirements.  Melodie Bouillon, MD Michel Bickers B. Maximino Greenland MD, MD 02/19/2020 12:02:37 PM This report has been signed electronically. Number of Addenda: 0 Note Initiated On: 02/19/2020 11:30 AM Scope Withdrawal Time: 0 hours 14 minutes 35 seconds  Total Procedure Duration: 0 hours 17 minutes 52 seconds       [facility]

## 2020-02-20 NOTE — Anesthesia Postprocedure Evaluation (Signed)
Anesthesia Post Note  Patient: [name]  Procedure(s) Performed: COLONOSCOPY WITH PROPOFOL (N/A )  Patient location during evaluation: PACU Anesthesia Type: General Level of consciousness: awake and alert Pain management: pain level controlled Vital Signs Assessment: post-procedure vital signs reviewed and stable Respiratory status: spontaneous breathing, nonlabored ventilation and respiratory function stable Cardiovascular status: blood pressure returned to baseline and stable Postop Assessment: no apparent nausea or vomiting Anesthetic complications: no   No complications documented.   Last Vitals:  Vitals:   02/19/20 1220 02/19/20 1230  BP: (!) 153/80 (!) 163/70  Pulse:    Resp:    Temp:    SpO2:      Last Pain:  Vitals:   02/19/20 1230  TempSrc:   PainSc: 0-No pain                 Aurelio Brash Alaila Pillard

## 2020-03-01 ENCOUNTER — Ambulatory Visit (INDEPENDENT_AMBULATORY_CARE_PROVIDER_SITE_OTHER): Payer: Medicare Other | Admitting: Family Medicine

## 2020-03-01 ENCOUNTER — Other Ambulatory Visit: Payer: Self-pay

## 2020-03-01 ENCOUNTER — Encounter: Payer: Self-pay | Admitting: Family Medicine

## 2020-03-01 VITALS — BP 147/75 | HR 82 | Temp 98.1°F | Wt 307.0 lb

## 2020-03-01 DIAGNOSIS — F4321 Adjustment disorder with depressed mood: Secondary | ICD-10-CM | POA: Diagnosis not present

## 2020-03-01 DIAGNOSIS — B029 Zoster without complications: Secondary | ICD-10-CM | POA: Diagnosis not present

## 2020-03-01 NOTE — Progress Notes (Signed)
BP (!) 147/75 Comment: right lower arm  Pulse 82   Temp 98.1 F (36.7 C) (Oral)   Wt (!) 307 lb (139.3 kg)   SpO2 96%   BMI 51.09 kg/m    Subjective:    Patient ID: [name], female    DOB: [birth year], 69 y.o.   MRN: [id number]  HPI: [name] is a 69 y.o. female  Chief Complaint  Patient presents with  . Grief  . Rash   Here today for f/u grief and shingles rash/pain. Still having significant pain on the right side of face/scalp, ear from severe shingles. Rash is mostly completely cleared up but still can't apply pressure to these areas without significant burning/tingling pain. Taking gabapentin and applying diclofenac gel, taking tylenol prn with very minimal relief.   Taking the xanax as needed, tends to be about every other day lately. Recently lost her son suddenly to a heart attack. Not currently working with counseling. Denies mood lability, SI/HI.   Depression screen [facility] 2/9 12/18/2019  Decreased Interest 0  Down, Depressed, Hopeless 0  PHQ - 2 Score 0   GAD 7 : Generalized Anxiety Score 12/18/2019  Nervous, Anxious, on Edge 2  Control/stop worrying 0  Worry too much - different things 3  Trouble relaxing 0  Restless 0  Easily annoyed or irritable 1  Afraid - awful might happen 0  Total GAD 7 Score 6   Relevant past medical, surgical, family and social history reviewed and updated as indicated. Interim medical history since our last visit reviewed. Allergies and medications reviewed and updated.  Review of Systems  Per HPI unless specifically indicated above     Objective:    BP (!) 147/75 Comment: right lower arm  Pulse 82   Temp 98.1 F (36.7 C) (Oral)   Wt (!) 307 lb (139.3 kg)   SpO2 96%   BMI 51.09 kg/m   Wt Readings from Last 3 Encounters:  03/01/20 (!) 307 lb (139.3 kg)  02/19/20 (!) 300 lb (136.1 kg)  01/19/20 (!) 303 lb (137.4 kg)    Physical Exam Vitals and nursing note reviewed.  Constitutional:      Appearance: Normal  appearance. She is not ill-appearing.  HENT:     Head: Atraumatic.  Eyes:     Extraocular Movements: Extraocular movements intact.     Conjunctiva/sclera: Conjunctivae normal.  Cardiovascular:     Rate and Rhythm: Normal rate and regular rhythm.     Heart sounds: Normal heart sounds.  Pulmonary:     Effort: Pulmonary effort is normal.     Breath sounds: Normal breath sounds.  Musculoskeletal:        General: Tenderness (ttp right neck, right ear, right scalp) present. Normal range of motion.     Cervical back: Normal range of motion and neck supple.  Skin:    General: Skin is warm and dry.     Findings: No rash.  Neurological:     Mental Status: She is alert and oriented to person, place, and time.  Psychiatric:        Mood and Affect: Mood normal.        Thought Content: Thought content normal.        Judgment: Judgment normal.     Results for orders placed or performed during the hospital encounter of 02/17/20  SARS CORONAVIRUS 2 (TAT 6-24 HRS) Nasopharyngeal Nasopharyngeal Swab   Specimen: Nasopharyngeal Swab  Result Value Ref Range   SARS Coronavirus  2 NEGATIVE NEGATIVE      Assessment & Plan:   Problem List Items Addressed This Visit    None    Visit Diagnoses    Herpes zoster without complication    -  Primary   Add nortriptyline to gabapentin and OTC pain relievers. May need to go to Pain Mgmt if not able to get good control with non-controlled medications   Grief       Continue prn xanax, add nortriptyline to help with moods and sleep in addition to shingles pain. Grief counseling strongly recommended      25 minutes spent today in direct patient care, counseling  Follow up plan: Return in about 4 weeks (around 03/29/2020) for Shingles pain f/u.

## 2020-03-03 ENCOUNTER — Other Ambulatory Visit: Payer: Self-pay | Admitting: Family Medicine

## 2020-03-03 NOTE — Telephone Encounter (Signed)
Requested medication (s) are due for refill today: yes  Requested medication (s) are on the active medication list: yes  Last refill:  01/19/2020  Future visit scheduled: yes  Notes to clinic: this refill cannot be delegated    Requested Prescriptions  Pending Prescriptions Disp Refills   ALPRAZolam (XANAX) 0.5 MG tablet 30 tablet 0    Sig: Take 1 tablet (0.5 mg total) by mouth daily as needed for anxiety.      Not Delegated - Psychiatry:  Anxiolytics/Hypnotics Failed - 03/03/2020  3:01 PM      Failed - This refill cannot be delegated      Failed - Urine Drug Screen completed in last 360 days.      Passed - Valid encounter within last 6 months    Recent Outpatient Visits           2 days ago    [facility], [place], [place]   1 month ago Grief   [facility] Maser [place], [place]   2 months ago Herpes zoster without complication   [facility] [place], [place], [place]   2 months ago Herpes zoster without complication   [facility] M[facility] [place], [place]   2 months ago Herpes zoster without complication   [facility] [place], [place]       Future Appointments             In 3 weeks [name], Sue Gross, NP [facility], [facility]

## 2020-03-03 NOTE — Telephone Encounter (Signed)
Medication: ALPRAZolam (XANAX) 0.5 MG tablet [[id number]] ,   Has the patient contacted their pharmacy? Yes  (Agent: If no, request that the patient contact the pharmacy for the refill.) (Agent: If yes, when and what did the pharmacy advise?)  Preferred Pharmacy (with phone number or street name): [id number] - [place], [place] [address]  Phone:  [phone] Fax:  [phone]     Agent: Please be advised that RX refills may take up to 3 business days. We ask that you follow-up with your pharmacy.

## 2020-03-04 MED ORDER — ALPRAZOLAM 0.5 MG PO TABS: 0.5000 mg | ORAL_TABLET | Freq: Every day | ORAL | 0 refills | Status: DC | PRN

## 2020-03-06 MED ORDER — NORTRIPTYLINE HCL 25 MG PO CAPS: 25.0000 mg | ORAL_CAPSULE | Freq: Every day | ORAL | 0 refills | Status: DC

## 2020-03-21 ENCOUNTER — Other Ambulatory Visit: Payer: Self-pay | Admitting: Family Medicine

## 2020-03-21 NOTE — Telephone Encounter (Signed)
Requesting to change to a 90 day supply instead of 30.

## 2020-03-21 NOTE — Telephone Encounter (Signed)
° °  Notes to clinic:   comment: REQUEST FOR 90 DAYS PRESCRIPTION  Requested Prescriptions  Pending Prescriptions Disp Refills   nortriptyline (PAMELOR) 25 MG capsule [Pharmacy Med Name: NORTRIPTYLINE HCL 25 MG CAP] 90 capsule 1    Sig: Take 1 capsule (25 mg total) by mouth at bedtime.      Psychiatry:  Antidepressants - Heterocyclics (TCAs) Passed - 03/21/2020 12:32 PM      Passed - Valid encounter within last 6 months    Recent Outpatient Visits           2 weeks ago Herpes zoster without complication   [facility] [place], [place]   2 months ago Grief   [facility] [place], [place], [place]   2 months ago Herpes zoster without complication   [facility] [place], [place], [place]   2 months ago Herpes zoster without complication   [facility] Maser [place], [place]   3 months ago Herpes zoster without complication   [facility] [place], [place]       Future Appointments             In 1 week Valentino Nose, NP [facility], [facility]

## 2020-03-28 NOTE — Progress Notes (Signed)
There were no vitals taken for this visit.   Subjective:    Patient ID: [name], female    DOB: 05-18-1951, 69 y.o.   MRN: [id number]  HPI: [name] is a 69 y.o. female presenting for shingles follow up.  Chief Complaint  Patient presents with  . Pain    pt states she has still been having pain and still has the sores from the rash. States diclofenac has not been helping.    SHINGLES FOLLOW UP Reports she has sores in hair that have been there since her initial shingles flare; it first developed on her neck but this has completely healed.  Duration: months Location: right side of scalp Painful:  yes Severity: 8/10  Paresthesia:  yes Hyperesthesia: yes Itching:  yes Burning:  yes Oozing:  no Blisters:  yes Fevers:  no History of the same:  yes  Associated symptoms: headache Alleviating factors: nothing  Status: stable Treatments attempted: voltaren gel (does not help), gabapentin 600 mg tid, has not started the nortriptyline.  GRIEF Reports not being able to sleep well since the passing of her son suddenly.  Has been taking the xanax about every other night to help with sleep.  She has about 4 of these pills left and has not started taking the nortriptyline yet.  No Known Allergies  Outpatient Encounter Medications as of 03/29/2020  Medication Sig  . Acetaminophen (TYLENOL 8 HOUR PO) Take by mouth as needed.  . ALPRAZolam (XANAX) 0.5 MG tablet Take 1 tablet (0.5 mg total) by mouth daily as needed for anxiety.  . diclofenac Sodium (VOLTAREN) 1 % GEL Apply topically 3 (three) times daily.   Marland Kitchen gabapentin (NEURONTIN) 600 MG tablet Take 1 tablet (600 mg total) by mouth 3 (three) times daily as needed.  . nortriptyline (PAMELOR) 25 MG capsule TAKE 1 CAPSULE (25 MG TOTAL) BY MOUTH AT BEDTIME.   No facility-administered encounter medications on file as of 03/29/2020.   Patient Active Problem List   Diagnosis Date Noted  . Herpes zoster without complication  03/29/2020  . Grief 03/29/2020  . Cough 03/29/2020  . Encounter for screening colonoscopy   . Elevated blood pressure reading 01/19/2020  . Osteoarthritis of right knee 12/21/2019  . Morbid obesity due to excess calories (HCC) 08/13/2015  . Chronic pain of right knee 08/13/2015   Past Medical History:  Diagnosis Date  . History of shingles   . Knee pain   . Migraines    Relevant past medical, surgical, family and social history reviewed and updated as indicated. Interim medical history since our last visit reviewed.  Review of Systems  Constitutional: Negative.  Negative for activity change, fatigue and fever.  HENT: Positive for congestion.   Eyes: Negative.  Negative for visual disturbance.  Respiratory: Positive for cough.   Cardiovascular: Negative.   Musculoskeletal: Positive for myalgias. Negative for arthralgias, neck pain and neck stiffness.  Skin: Positive for rash. Negative for color change, pallor and wound.  Neurological: Positive for headaches. Negative for dizziness and weakness.  Psychiatric/Behavioral: Positive for sleep disturbance. Negative for agitation. The patient is not nervous/anxious.     Per HPI unless specifically indicated above     Objective:    There were no vitals taken for this visit.  Wt Readings from Last 3 Encounters:  03/01/20 (!) 307 lb (139.3 kg)  02/19/20 (!) 300 lb (136.1 kg)  01/19/20 (!) 303 lb (137.4 kg)    Physical Exam Vitals and nursing note  reviewed.  Constitutional:      General: She is not in acute distress.    Appearance: Normal appearance. She is not toxic-appearing.  HENT:     Head: Normocephalic and atraumatic.     Right Ear: External ear normal.     Left Ear: External ear normal.     Nose: Nose normal. No congestion.     Mouth/Throat:     Mouth: Mucous membranes are moist.     Pharynx: Oropharynx is clear.  Eyes:     General: No scleral icterus.    Extraocular Movements: Extraocular movements intact.    Cardiovascular:     Comments: Unable to assess heart sounds via virtual visit Pulmonary:     Effort: Pulmonary effort is normal. No respiratory distress.     Comments: Unable to assess lung sounds via virtual visit - talking in complete sentences Skin:    Coloration: Skin is not jaundiced or pale.     Findings: Erythema (mild erythema noted to right side of scalp) present.  Neurological:     General: No focal deficit present.     Mental Status: She is alert and oriented to person, place, and time.  Psychiatric:        Mood and Affect: Mood normal.        Behavior: Behavior normal.        Thought Content: Thought content normal.        Judgment: Judgment normal.      Assessment & Plan:   Problem List Items Addressed This Visit      Other   Herpes zoster without complication - Primary    Chronic, ongoing.  Blisters not currently present, but with ongoing discomfort.  Encouraged to start nortriptyline that has been prescribed previously.  If no improvement in about 4 weeks, may consider referral to infectious disease for ongoing postherpetic pain.      Grief    Ongoing.  Dealing with unexpected loss of son and with trouble sleeping.  Encouraged to start nortriptyline to help with multiple issues including sleep.  If ongoing, consider counseling.        Cough    Acute, ongoing.  Most likely allergic component given symptoms started when using overhead fan; encouraged to obtain COVID-19 testing and/or isolate for 14 days.  No other infectious symptoms today.  If symptoms persist >1 more week, return to clinic for further evaluation.          Follow up plan: Return in about 4 weeks (around 04/26/2020) for shingles and sleep follow up.  Due to the catastrophic nature of the COVID-19 pandemic, this visit was completed via audio and visual contact via Mychart due to the restrictions of the COVID-19 pandemic. All issues as above were discussed and addressed. Physical exam was done  as above through visual confirmation on Mychart. If it was felt that the patient should be evaluated in the office, they were directed there. The patient verbally consented to this visit."} . Location of the patient: parking lot . Location of the provider: work . Those involved with this call:  . Provider: Mardene Celeste, DNP . CMA: Wilhemena Durie, CMA . Front Desk/Registration: Beverely Pace  . Time spent on call: 10 minutes with patient face to face via video conference. More than 50% of this time was spent in counseling and coordination of care. 18 minutes total spent in review of patient's record and preparation of their chart.  I verified patient identity using two factors (patient name  and date of birth). Patient consents verbally to being seen via telemedicine visit today.

## 2020-03-29 ENCOUNTER — Encounter: Payer: Self-pay | Admitting: Nurse Practitioner

## 2020-03-29 ENCOUNTER — Telehealth (INDEPENDENT_AMBULATORY_CARE_PROVIDER_SITE_OTHER): Payer: Medicare Other | Admitting: Nurse Practitioner

## 2020-03-29 ENCOUNTER — Other Ambulatory Visit: Payer: Self-pay

## 2020-03-29 ENCOUNTER — Telehealth: Payer: Self-pay | Admitting: Family Medicine

## 2020-03-29 DIAGNOSIS — F4321 Adjustment disorder with depressed mood: Secondary | ICD-10-CM | POA: Insufficient documentation

## 2020-03-29 DIAGNOSIS — R05 Cough: Secondary | ICD-10-CM

## 2020-03-29 DIAGNOSIS — R059 Cough, unspecified: Secondary | ICD-10-CM | POA: Insufficient documentation

## 2020-03-29 DIAGNOSIS — B0229 Other postherpetic nervous system involvement: Secondary | ICD-10-CM | POA: Insufficient documentation

## 2020-03-29 DIAGNOSIS — B029 Zoster without complications: Secondary | ICD-10-CM

## 2020-03-29 NOTE — Telephone Encounter (Signed)
Called pt to schedule a 4 week f/u per Shanda Bumps, no answer, left vm

## 2020-03-29 NOTE — Patient Instructions (Signed)
Postherpetic Neuralgia Postherpetic neuralgia (PHN) is nerve pain that occurs after a shingles infection. Shingles is a painful rash that appears on one area of the body, usually on the trunk or face. Shingles is caused by the varicella-zoster virus. This is the same virus that causes chickenpox. In people who have had chickenpox, the virus can resurface years later and cause shingles. You may have PHN if you continue to have pain for 4 months after your shingles rash has gone away. PHN appears in the same area where you had the shingles rash. The pain usually goes away after the rash disappears. Getting a vaccination for shingles can prevent PHN. This vaccine is recommended for people older than 60. It may prevent shingles, and may also lower your risk of PHN if you do get shingles. What are the causes? This condition is caused by damage to your nerves from the varicella-zoster virus. The damage makes your nerves overly sensitive. What increases the risk? The following factors may make you more likely to develop this condition:  Being older than 68 years of age.  Having severe pain before your shingles rash starts.  Having a severe rash.  Having shingles in and around the eye area.  Having a disease that makes your body unable to fight infections (weak immune system). What are the signs or symptoms? The main symptom of this condition is pain. The pain may:  Often be very bad and may be described as stabbing, burning, or feeling like an electric shock.  Come and go or may be there all the time.  Be triggered by light touches on the skin or changes in temperature. You may have itching along with the pain. How is this diagnosed? This condition may be diagnosed based on your symptoms and your history of shingles. Lab studies and other diagnostic tests are usually not needed. How is this treated? There is no cure for this condition. Treatment for PHN will focus on pain relief.  Over-the-counter pain relievers do not usually relieve PHN pain. You may need to work with a pain specialist. Treatment may include:  Antidepressant medicines to help with pain and improve sleep.  Anti-seizure medicines to relieve nerve pain.  Strong pain relievers (opioids).  A numbing patch worn on the skin (lidocaine patch).  Botox (botulinum toxin) injections to block pain signals between nerves and muscles.  Injections of numbing medicine or anti-inflammatory medicines around irritated nerves. Follow these instructions at home:   It may take a long time to recover from PHN. Work closely with your health care provider and develop a good support system at home.  Take over-the-counter and prescription medicines only as told by your health care provider.  Do not drive or use heavy machinery while taking prescription pain medicine.  Wear loose, comfortable clothing.  Cover sensitive areas with a dressing to reduce friction from clothing rubbing on the area.  If directed, put ice on the painful area: ? Put ice in a plastic bag. ? Place a towel between your skin and the bag. ? Leave the ice on for 20 minutes, 2-3 times a day.  Talk to your health care provider if you feel depressed or desperate. Living with long-term pain can be depressing.  Keep all follow-up visits as told by your health care provider. This is important. Contact a health care provider if:  Your medicine is not helping.  You are struggling to manage your pain at home. Summary  Postherpetic neuralgia is a very painful disorder   that can occur after an episode of shingles.  The pain is often severe, burning, electric, or stabbing.  Prescription medicines can be helpful in managing persistent pain.  Getting a vaccination for shingles can prevent PHN. This vaccine is recommended for people older than 60. This information is not intended to replace advice given to you by your health care provider. Make sure  you discuss any questions you have with your health care provider. Document Revised: 06/21/2017 Document Reviewed: 09/25/2016 Elsevier Patient Education  2020 Elsevier Inc.  

## 2020-03-29 NOTE — Assessment & Plan Note (Addendum)
Ongoing.  Dealing with unexpected loss of son and with trouble sleeping.  Encouraged to start nortriptyline to help with multiple issues including sleep.  If ongoing, consider counseling.  Advised that xanax is not to be used as a long-term medication and if needing medication for longer periods, will need to consider other options.

## 2020-03-29 NOTE — Assessment & Plan Note (Signed)
Acute, ongoing.  Most likely allergic component given symptoms started when using overhead fan; encouraged to obtain COVID-19 testing and/or isolate for 14 days.  No other infectious symptoms today.  If symptoms persist >1 more week, return to clinic for further evaluation.

## 2020-03-29 NOTE — Assessment & Plan Note (Signed)
Chronic, ongoing.  Blisters not currently present, but with ongoing discomfort.  Encouraged to start nortriptyline that has been prescribed previously.  If no improvement in about 4 weeks, may consider referral to infectious disease for ongoing postherpetic pain.

## 2020-04-07 NOTE — Telephone Encounter (Signed)
Called pt to schedule 4 weeks (around 04/26/2020) for shingles and sleep follow up no answer, left vm

## 2020-04-08 ENCOUNTER — Ambulatory Visit (INDEPENDENT_AMBULATORY_CARE_PROVIDER_SITE_OTHER): Payer: Medicare Other

## 2020-04-08 VITALS — Ht 65.0 in | Wt 300.0 lb

## 2020-04-08 DIAGNOSIS — Z0000 Encounter for general adult medical examination without abnormal findings: Secondary | ICD-10-CM

## 2020-04-08 NOTE — Progress Notes (Signed)
I connected with [name] today by telephone and verified that I am speaking with the correct person using two identifiers. Location patient: home Location provider: work Persons participating in the virtual visit: [name], Sue Ponder LPN.   I discussed the limitations, risks, security and privacy concerns of performing an evaluation and management service by telephone and the availability of in person appointments. I also discussed with the patient that there may be a patient responsible charge related to this service. The patient expressed understanding and verbally consented to this telephonic visit.    Interactive audio and video telecommunications were attempted between this provider and patient, however failed, due to patient having technical difficulties OR patient did not have access to video capability.  We continued and completed visit with audio only.     Vital signs may be patient reported or missing.  Subjective:   [name] is a 69 y.o. female who presents for an Initial Medicare Annual Wellness Visit.  Review of Systems     Cardiac Risk Factors include: advanced age (>31men, >20 women);obesity (BMI >30kg/m2)     Objective:    Today's Vitals   04/08/20 0856  Weight: 300 lb (136.1 kg)  Height: 5\' 5"  (1.651 m)   Body mass index is 49.92 kg/m.  Advanced Directives 04/08/2020 02/19/2020 12/13/2019 12/19/2016 09/01/2015  Does Patient Have a Medical Advance Directive? No No No No No  Would patient like information on creating a medical advance directive? - No - Patient declined - - No - patient declined information    Current Medications (verified) Outpatient Encounter Medications as of 04/08/2020  Medication Sig  . Acetaminophen (TYLENOL 8 HOUR PO) Take by mouth as needed.  . ALPRAZolam (XANAX) 0.5 MG tablet Take 1 tablet (0.5 mg total) by mouth daily as needed for anxiety.  . diclofenac Sodium (VOLTAREN) 1 % GEL Apply topically 3 (three) times daily.    04/10/2020 gabapentin (NEURONTIN) 600 MG tablet Take 1 tablet (600 mg total) by mouth 3 (three) times daily as needed.  . nortriptyline (PAMELOR) 25 MG capsule TAKE 1 CAPSULE (25 MG TOTAL) BY MOUTH AT BEDTIME.   No facility-administered encounter medications on file as of 04/08/2020.    Allergies (verified) Patient has no known allergies.   History: Past Medical History:  Diagnosis Date  . History of shingles   . Knee pain   . Migraines    Past Surgical History:  Procedure Laterality Date  . COLONOSCOPY WITH PROPOFOL N/A 02/19/2020   Procedure: COLONOSCOPY WITH PROPOFOL;  Surgeon: 02/21/2020, MD;  Location: [facility] ENDOSCOPY;  Service: Endoscopy;  Laterality: N/A;  . REPLACEMENT TOTAL KNEE     left knee  . TUBAL LIGATION     Family History  Problem Relation Age of Onset  . Heart disease Mother   . Hypertension Mother   . Cancer Father   . Cancer Brother    Social History   Socioeconomic History  . Marital status: Widowed    Spouse name: Not on file  . Number of children: Not on file  . Years of education: Not on file  . Highest education level: Not on file  Occupational History  . Occupation: retired  Tobacco Use  . Smoking status: Never Smoker  . Smokeless tobacco: Never Used  Vaping Use  . Vaping Use: Never used  Substance and Sexual Activity  . Alcohol use: No  . Drug use: No  . Sexual activity: Not Currently  Other Topics Concern  .  Not on file  Social History Narrative  . Not on file   Social Determinants of Health   Financial Resource Strain: Low Risk   . Difficulty of Paying Living Expenses: Not hard at all  Food Insecurity: No Food Insecurity  . Worried About Programme researcher, broadcasting/film/videounning Out of Food in the Last Year: Never true  . Ran Out of Food in the Last Year: Never true  Transportation Needs: No Transportation Needs  . Lack of Transportation (Medical): No  . Lack of Transportation (Non-Medical): No  Physical Activity: Sufficiently Active  . Days of Exercise per  Week: 3 days  . Minutes of Exercise per Session: 120 min  Stress: Stress Concern Present  . Feeling of Stress : Very much  Social Connections:   . Frequency of Communication with Friends and Family: Not on file  . Frequency of Social Gatherings with Friends and Family: Not on file  . Attends Religious Services: Not on file  . Active Member of Clubs or Organizations: Not on file  . Attends BankerClub or Organization Meetings: Not on file  . Marital Status: Not on file    Tobacco Counseling Counseling given: Not Answered   Clinical Intake:  Pre-visit preparation completed: Yes  Pain : No/denies pain     Nutritional Status: BMI > 30  Obese Nutritional Risks: None Diabetes: No  How often do you need to have someone help you when you read instructions, pamphlets, or other written materials from your doctor or pharmacy?: 1 - Never What is the last grade level you completed in school?: 9th grade  Diabetic? no  Interpreter Needed?: No  Information entered by :: NAllen LPN   Activities of Daily Living In your present state of health, do you have any difficulty performing the following activities: 04/08/2020 12/18/2019  Hearing? N N  Vision? N N  Difficulty concentrating or making decisions? N N  Walking or climbing stairs? Y Y  Dressing or bathing? N N  Doing errands, shopping? N N  Preparing Food and eating ? N -  Using the Toilet? N -  In the past six months, have you accidently leaked urine? N -  Do you have problems with loss of bowel control? N -  Managing your Medications? N -  Managing your Finances? N -  Housekeeping or managing your Housekeeping? N -  Some recent data might be hidden    Patient Care Team: Particia NearingLane, Rachel Elizabeth, PA-C as PCP - General (Family Medicine)  Indicate any recent Medical Services you may have received from other than [facility] providers in the past year (date may be approximate).     Assessment:   This is a routine wellness examination for  [name].  Hearing/Vision screen  Hearing Screening   125Hz  250Hz  500Hz  1000Hz  2000Hz  3000Hz  4000Hz  6000Hz  8000Hz   Right ear:           Left ear:           Vision Screening Comments: No regular eye exams  Dietary issues and exercise activities discussed: Current Exercise Habits: Home exercise routine, Type of exercise: treadmill;strength training/weights;Other - see comments (stationary bike), Time (Minutes): > 60, Frequency (Times/Week): 3, Weekly Exercise (Minutes/Week): 0  Goals    . Patient Stated     04/08/2020, no goals      Depression Screen PHQ 2/9 Scores 04/08/2020 12/18/2019  PHQ - 2 Score 0 0    Fall Risk Fall Risk  04/08/2020  Falls in the past year? 1  Comment loses balance  Number falls in past yr: 1  Injury with Fall? 1  Comment sprained knee  Risk for fall due to : Medication side effect;History of fall(s);Impaired mobility  Follow up Falls evaluation completed;Education provided;Falls prevention discussed    Any stairs in or around the home? No  If so, are there any without handrails?n/a Home free of loose throw rugs in walkways, pet beds, electrical cords, etc? Yes  Adequate lighting in your home to reduce risk of falls? Yes   ASSISTIVE DEVICES UTILIZED TO PREVENT FALLS:  Life alert? No  Use of a cane, walker or w/c? Yes  Grab bars in the bathroom? No  Shower chair or bench in shower? Yes  Elevated toilet seat or a handicapped toilet? No   TIMED UP AND GO:  Was the test performed? No .   Cognitive Function:     6CIT Screen 04/08/2020  What Year? 0 points  What month? 0 points  What time? 0 points  Count back from 20 2 points  Months in reverse 4 points  Repeat phrase 0 points  Total Score 6    Immunizations  There is no immunization history on file for this patient.  TDAP status: Due, Education has been provided regarding the importance of this vaccine. Advised may receive this vaccine at local pharmacy or Health Dept. Aware to provide a  copy of the vaccination record if obtained from local pharmacy or Health Dept. Verbalized acceptance and understanding. Flu Vaccine status: Declined, Education has been provided regarding the importance of this vaccine but patient still declined. Advised may receive this vaccine at local pharmacy or Health Dept. Aware to provide a copy of the vaccination record if obtained from local pharmacy or Health Dept. Verbalized acceptance and understanding. Pneumococcal vaccine status: Declined,  Education has been provided regarding the importance of this vaccine but patient still declined. Advised may receive this vaccine at local pharmacy or Health Dept. Aware to provide a copy of the vaccination record if obtained from local pharmacy or Health Dept. Verbalized acceptance and understanding.  Covid-19 vaccine status: Declined, Education has been provided regarding the importance of this vaccine but patient still declined. Advised may receive this vaccine at local pharmacy or Health Dept.or vaccine clinic. Aware to provide a copy of the vaccination record if obtained from local pharmacy or Health Dept. Verbalized acceptance and understanding.  Qualifies for Shingles Vaccine? Yes   Zostavax completed No   Shingrix Completed?: No.    Education has been provided regarding the importance of this vaccine. Patient has been advised to call insurance company to determine out of pocket expense if they have not yet received this vaccine. Advised may also receive vaccine at local pharmacy or Health Dept. Verbalized acceptance and understanding.  Screening Tests Health Maintenance  Topic Date Due  . DEXA SCAN  Never done  . COVID-19 Vaccine (1) 04/14/2020 (Originally 09/08/1962)  . INFLUENZA VACCINE  10/20/2020 (Originally 02/21/2020)  . PNA vac Low Risk Adult (1 of 2 - PCV13) 01/18/2021 (Originally 09/07/2016)  . TETANUS/TDAP  04/08/2021 (Originally [birth year]1970)  . COLONOSCOPY  02/18/2021  . MAMMOGRAM  02/14/2022  .  Hepatitis C Screening  Completed    Health Maintenance  Health Maintenance Due  Topic Date Due  . DEXA SCAN  Never done    Colorectal cancer screening: Completed 02/19/2020. Repeat every 10 years Mammogram status: Completed 02/15/2020. Repeat every year Bone Density status: due  Lung Cancer Screening: (Low Dose CT Chest recommended if Age 56-80 years, 30 pack-year  currently smoking OR have quit w/in 15years.) does not qualify.   Lung Cancer Screening Referral: no  Additional Screening:  Hepatitis C Screening: does qualify; Completed 01/19/2020  Vision Screening: Recommended annual ophthalmology exams for early detection of glaucoma and other disorders of the eye. Is the patient up to date with their annual eye exam?  No  Who is the provider or what is the name of the office in which the patient attends annual eye exams? none If pt is not established with a provider, would they like to be referred to a provider to establish care? No .   Dental Screening: Recommended annual dental exams for proper oral hygiene  Community Resource Referral / Chronic Care Management: CRR required this visit?  No   CCM required this visit?  No      Plan:     I have personally reviewed and noted the following in the patient's chart:   . Medical and social history . Use of alcohol, tobacco or illicit drugs  . Current medications and supplements . Functional ability and status . Nutritional status . Physical activity . Advanced directives . List of other physicians . Hospitalizations, surgeries, and ER visits in previous 12 months . Vitals . Screenings to include cognitive, depression, and falls . Referrals and appointments  In addition, I have reviewed and discussed with patient certain preventive protocols, quality metrics, and best practice recommendations. A written personalized care plan for preventive services as well as general preventive health recommendations were provided to  patient.     Barb Merino, LPN   [id number]   Nurse Notes: Patient declines vaccinations.

## 2020-04-08 NOTE — Patient Instructions (Signed)
[name] , Thank you for taking time to come for your Medicare Wellness Visit. I appreciate your ongoing commitment to your health goals. Please review the following plan we discussed and let me know if I can assist you in the future.   Screening recommendations/referrals: Colonoscopy: completed 02/19/2020 Mammogram: completed 02/15/2020 Bone Density: ordered by PCP Recommended yearly ophthalmology/optometry visit for glaucoma screening and checkup Recommended yearly dental visit for hygiene and checkup  Vaccinations: Influenza vaccine: declines Pneumococcal vaccine: declines Tdap vaccine: declines Shingles vaccine:  discussed   Covid-19: declines  Advanced directives: Advance directive discussed with you today.    Conditions/risks identified: none  Next appointment: Follow up in one year for your annual wellness visit    Preventive Care 65 Years and Older, Female Preventive care refers to lifestyle choices and visits with your health care provider that can promote health and wellness. What does preventive care include?  A yearly physical exam. This is also called an annual well check.  Dental exams once or twice a year.  Routine eye exams. Ask your health care provider how often you should have your eyes checked.  Personal lifestyle choices, including:  Daily care of your teeth and gums.  Regular physical activity.  Eating a healthy diet.  Avoiding tobacco and drug use.  Limiting alcohol use.  Practicing safe sex.  Taking low-dose aspirin every day.  Taking vitamin and mineral supplements as recommended by your health care provider. What happens during an annual well check? The services and screenings done by your health care provider during your annual well check will depend on your age, overall health, lifestyle risk factors, and family history of disease. Counseling  Your health care provider may ask you questions about your:  Alcohol use.  Tobacco  use.  Drug use.  Emotional well-being.  Home and relationship well-being.  Sexual activity.  Eating habits.  History of falls.  Memory and ability to understand (cognition).  Work and work Astronomer.  Reproductive health. Screening  You may have the following tests or measurements:  Height, weight, and BMI.  Blood pressure.  Lipid and cholesterol levels. These may be checked every 5 years, or more frequently if you are over 35 years old.  Skin check.  Lung cancer screening. You may have this screening every year starting at age 62 if you have a 30-pack-year history of smoking and currently smoke or have quit within the past 15 years.  Fecal occult blood test (FOBT) of the stool. You may have this test every year starting at age 82.  Flexible sigmoidoscopy or colonoscopy. You may have a sigmoidoscopy every 5 years or a colonoscopy every 10 years starting at age 70.  Hepatitis C blood test.  Hepatitis B blood test.  Sexually transmitted disease (STD) testing.  Diabetes screening. This is done by checking your blood sugar (glucose) after you have not eaten for a while (fasting). You may have this done every 1-3 years.  Bone density scan. This is done to screen for osteoporosis. You may have this done starting at age 53.  Mammogram. This may be done every 1-2 years. Talk to your health care provider about how often you should have regular mammograms. Talk with your health care provider about your test results, treatment options, and if necessary, the need for more tests. Vaccines  Your health care provider may recommend certain vaccines, such as:  Influenza vaccine. This is recommended every year.  Tetanus, diphtheria, and acellular pertussis (Tdap, Td) vaccine. You may  need a Td booster every 10 years.  Zoster vaccine. You may need this after age 75.  Pneumococcal 13-valent conjugate (PCV13) vaccine. One dose is recommended after age 64.  Pneumococcal  polysaccharide (PPSV23) vaccine. One dose is recommended after age 94. Talk to your health care provider about which screenings and vaccines you need and how often you need them. This information is not intended to replace advice given to you by your health care provider. Make sure you discuss any questions you have with your health care provider. Document Released: 08/05/2015 Document Revised: 03/28/2016 Document Reviewed: 05/10/2015 Elsevier Interactive Patient Education  2017 [employer].  Fall Prevention in the Home Falls can cause injuries. They can happen to people of all ages. There are many things you can do to make your home safe and to help prevent falls. What can I do on the outside of my home?  Regularly fix the edges of walkways and driveways and fix any cracks.  Remove anything that might make you trip as you walk through a door, such as a raised step or threshold.  Trim any bushes or trees on the path to your home.  Use bright outdoor lighting.  Clear any walking paths of anything that might make someone trip, such as rocks or tools.  Regularly check to see if handrails are loose or broken. Make sure that both sides of any steps have handrails.  Any raised decks and porches should have guardrails on the edges.  Have any leaves, snow, or ice cleared regularly.  Use sand or salt on walking paths during winter.  Clean up any spills in your garage right away. This includes oil or grease spills. What can I do in the bathroom?  Use night lights.  Install grab bars by the toilet and in the tub and shower. Do not use towel bars as grab bars.  Use non-skid mats or decals in the tub or shower.  If you need to sit down in the shower, use a plastic, non-slip stool.  Keep the floor dry. Clean up any water that spills on the floor as soon as it happens.  Remove soap buildup in the tub or shower regularly.  Attach bath mats securely with double-sided non-slip rug  tape.  Do not have throw rugs and other things on the floor that can make you trip. What can I do in the bedroom?  Use night lights.  Make sure that you have a light by your bed that is easy to reach.  Do not use any sheets or blankets that are too big for your bed. They should not hang down onto the floor.  Have a firm chair that has side arms. You can use this for support while you get dressed.  Do not have throw rugs and other things on the floor that can make you trip. What can I do in the kitchen?  Clean up any spills right away.  Avoid walking on wet floors.  Keep items that you use a lot in easy-to-reach places.  If you need to reach something above you, use a strong step stool that has a grab bar.  Keep electrical cords out of the way.  Do not use floor polish or wax that makes floors slippery. If you must use wax, use non-skid floor wax.  Do not have throw rugs and other things on the floor that can make you trip. What can I do with my stairs?  Do not leave any items on  the stairs.  Make sure that there are handrails on both sides of the stairs and use them. Fix handrails that are broken or loose. Make sure that handrails are as long as the stairways.  Check any carpeting to make sure that it is firmly attached to the stairs. Fix any carpet that is loose or worn.  Avoid having throw rugs at the top or bottom of the stairs. If you do have throw rugs, attach them to the floor with carpet tape.  Make sure that you have a light switch at the top of the stairs and the bottom of the stairs. If you do not have them, ask someone to add them for you. What else can I do to help prevent falls?  Wear shoes that:  Do not have high heels.  Have rubber bottoms.  Are comfortable and fit you well.  Are closed at the toe. Do not wear sandals.  If you use a stepladder:  Make sure that it is fully opened. Do not climb a closed stepladder.  Make sure that both sides of the  stepladder are locked into place.  Ask someone to hold it for you, if possible.  Clearly mark and make sure that you can see:  Any grab bars or handrails.  First and last steps.  Where the edge of each step is.  Use tools that help you move around (mobility aids) if they are needed. These include:  Canes.  Walkers.  Scooters.  Crutches.  Turn on the lights when you go into a dark area. Replace any light bulbs as soon as they burn out.  Set up your furniture so you have a clear path. Avoid moving your furniture around.  If any of your floors are uneven, fix them.  If there are any pets around you, be aware of where they are.  Review your medicines with your doctor. Some medicines can make you feel dizzy. This can increase your chance of falling. Ask your doctor what other things that you can do to help prevent falls. This information is not intended to replace advice given to you by your health care provider. Make sure you discuss any questions you have with your health care provider. Document Released: 05/05/2009 Document Revised: 12/15/2015 Document Reviewed: 08/13/2014 Elsevier Interactive Patient Education  2017 [employer].

## 2020-04-13 NOTE — Telephone Encounter (Signed)
scheduled

## 2020-04-25 NOTE — Progress Notes (Deleted)
   There were no vitals taken for this visit.   Subjective:    Patient ID: [name], female    DOB: [birth year], 69 y.o.   MRN: [id number]  HPI: [name] is a 69 y.o. female  No chief complaint on file.  SHINGLES Duration:  Location:    Painful:  {Blank single:19197::"yes","no"} Severity: {Blank single:19197::"mild","moderate","severe","1/10","2/10","3/10","4/10","5/10","6/10","7/10","8/10","9/10","10/10"}  Paresthesia:  {Blank single:19197::"yes","no"} Hyperesthesia: {Blank single:19197::"yes","no"} Itching:  {Blank single:19197::"yes","no"} Burning:  {Blank single:19197::"yes","no"} Oozing:  {Blank single:19197::"yes","no"} Blisters:  {Blank single:19197::"yes","no"} Fevers:  {Blank single:19197::"yes","no"} History of the same:  {Blank single:19197::"yes","no"} Alleviating factors:  Status: {Blank multiple:19196::"better","worse","stable","fluctuating"} Treatments attempted:  INSOMNIA Duration: {Blank single:19197::"chronic","months","years"} Satisfied with sleep quality: {Blank single:19197::"yes","no"} Difficulty falling asleep: {Blank single:19197::"yes","no"} Difficulty staying asleep: {Blank single:19197::"yes","no"} Waking a few hours after sleep onset: {Blank single:19197::"yes","no"} Early morning awakenings: {Blank single:19197::"yes","no"} Daytime hypersomnolence: {Blank single:19197::"yes","no"} Wakes feeling refreshed: {Blank single:19197::"yes","no"} Good sleep hygiene: {Blank single:19197::"yes","no"} Apnea: {Blank single:19197::"yes","no"} Snoring: {Blank single:19197::"yes","no"} Depressed/anxious mood: {Blank single:19197::"yes","no"} Recent stress: {Blank single:19197::"yes","no"} Restless legs/nocturnal leg cramps: {Blank single:19197::"yes","no"} Chronic pain/arthritis: {Blank single:19197::"yes","no"} History of sleep study: {Blank single:19197::"yes","no"} Treatments attempted: {Blank  multiple:19196::"none","melatonin","uinsom","benadryl","ambien"}   No Known Allergies  Outpatient Encounter Medications as of 04/26/2020  Medication Sig  . Acetaminophen (TYLENOL 8 HOUR PO) Take by mouth as needed.  . ALPRAZolam (XANAX) 0.5 MG tablet Take 1 tablet (0.5 mg total) by mouth daily as needed for anxiety.  . diclofenac Sodium (VOLTAREN) 1 % GEL Apply topically 3 (three) times daily.   Marland Kitchen gabapentin (NEURONTIN) 600 MG tablet Take 1 tablet (600 mg total) by mouth 3 (three) times daily as needed.  . nortriptyline (PAMELOR) 25 MG capsule TAKE 1 CAPSULE (25 MG TOTAL) BY MOUTH AT BEDTIME.   No facility-administered encounter medications on file as of 04/26/2020.   Patient Active Problem List   Diagnosis Date Noted  . Herpes zoster without complication 03/29/2020  . Grief 03/29/2020  . Cough 03/29/2020  . Encounter for screening colonoscopy   . Elevated blood pressure reading 01/19/2020  . Osteoarthritis of right knee 12/21/2019  . Morbid obesity due to excess calories (HCC) 08/13/2015  . Chronic pain of right knee 08/13/2015   Past Medical History:  Diagnosis Date  . History of shingles   . Knee pain   . Migraines    Relevant past medical, surgical, family and social history reviewed and updated as indicated. Interim medical history since our last visit reviewed.  Review of Systems  Per HPI unless specifically indicated above     Objective:    There were no vitals taken for this visit.  Wt Readings from Last 3 Encounters:  04/08/20 300 lb (136.1 kg)  03/01/20 (!) 307 lb (139.3 kg)  02/19/20 (!) 300 lb (136.1 kg)    Physical Exam  Results for orders placed or performed during the hospital encounter of 02/17/20  SARS CORONAVIRUS 2 (TAT 6-24 HRS) Nasopharyngeal Nasopharyngeal Swab   Specimen: Nasopharyngeal Swab  Result Value Ref Range   SARS Coronavirus 2 NEGATIVE NEGATIVE      Assessment & Plan:   Problem List Items Addressed This Visit    None        Follow up plan: No follow-ups on file.

## 2020-04-26 ENCOUNTER — Ambulatory Visit: Payer: Medicare Other | Admitting: Nurse Practitioner

## 2020-05-23 ENCOUNTER — Other Ambulatory Visit: Payer: Self-pay | Admitting: Family Medicine

## 2020-06-22 ENCOUNTER — Encounter: Payer: Self-pay | Admitting: Family Medicine

## 2020-06-22 ENCOUNTER — Ambulatory Visit (INDEPENDENT_AMBULATORY_CARE_PROVIDER_SITE_OTHER): Payer: Medicare HMO | Admitting: Family Medicine

## 2020-06-22 ENCOUNTER — Other Ambulatory Visit: Payer: Self-pay

## 2020-06-22 VITALS — BP 162/77 | HR 81 | Temp 97.6°F | Wt 308.5 lb

## 2020-06-22 DIAGNOSIS — F4321 Adjustment disorder with depressed mood: Secondary | ICD-10-CM | POA: Diagnosis not present

## 2020-06-22 DIAGNOSIS — R69 Illness, unspecified: Secondary | ICD-10-CM | POA: Diagnosis not present

## 2020-06-22 DIAGNOSIS — B029 Zoster without complications: Secondary | ICD-10-CM

## 2020-06-22 MED ORDER — VENLAFAXINE HCL ER 75 MG PO CP24: 75.0000 mg | ORAL_CAPSULE | Freq: Every day | ORAL | 1 refills | Status: DC

## 2020-06-22 NOTE — Assessment & Plan Note (Addendum)
Ongoing, now ~6 months with postherpetic pain. Few crusted vesicles present on exam today though without evidence of superinfection and appears to be healing.  Pain control medication compliance has been an issue although willing to try SNRI for pain relief, Rx sent. Encouraged gabapentin use and topical diclofenac as well. Will f/u in 4 weeks, if still no relief after adequate trial of pain control, may consider referral to pain management at that time.

## 2020-06-22 NOTE — Patient Instructions (Signed)
It was great to see you!  Our plans for today:  - Try the venlafaxine with gabapentin for your pain. You can take gabapentin during the day as well if you continue to have pain. - Come back in 4 weeks for recheck. If you are still having trouble with this, we may consider referral at that time.   Take care and seek immediate care sooner if you develop any concerns.   Dr. Linwood Dibbles

## 2020-06-22 NOTE — Progress Notes (Signed)
   SUBJECTIVE:   CHIEF COMPLAINT / HPI:   Postherpetic pain - Seen initially in May 2021 with severe shingles to R face, neck. Several ED and office visits for persistent pain, most recently 9/7. Previously on 3 rounds of valtrex and prednisone. - Prescribed gabapentin, diclofenac gel, nortriptyline, tylenol prn - not taking nortriptyline, only took about 5-6 doses. Made sleepy but didn't help pain. Been over a month since taken gabapentin. - shingles vaccine, had 1 of 2 doses 3 months ago.  - itching, sharp pain - denies oozing, fevers, recent change in soaps or detergents, SOB, myalgias/arthralgias, intraoral lesions - nothing makes better or worse - denies recent illness but has been continually stressed since passing of her son  Grief - Trigger: recent passing of son. Lucila Maine was taken away by biological mother after passing of father, doesn't get to see very often, previously helped raise him. - Medications: nortriptyline 25mg , xanax 0.5mg  daily prn - Taking: not taking nortriptyline - Counseling: no - Previous hospitalizations: no  Depression screen [facility] 2/9 06/22/2020 04/08/2020 12/18/2019  Decreased Interest 3 0 0  Down, Depressed, Hopeless 2 0 0  PHQ - 2 Score 5 0 0  Altered sleeping 3 - -  Tired, decreased energy 0 - -  Change in appetite 0 - -  Feeling bad or failure about yourself  0 - -  Trouble concentrating 0 - -  Moving slowly or fidgety/restless 0 - -  Suicidal thoughts 0 - -  PHQ-9 Score 8 - -    PERTINENT  PMH / PSH: OA, obesity  OBJECTIVE:   BP (!) 162/77   Pulse 81   Temp 97.6 F (36.4 C)   Wt (!) 308 lb 8 oz (139.9 kg)   SpO2 99%   BMI 51.34 kg/m   Gen: overweight, in NAD Skin: few vesicles with erythematous base and crusting to R sided scalp and L chin. Several well healing erythematous lesions to R lower face. Completely healed lesions to R neck. No lesions otherwise visible. Psych: appropriately dressed and well groomed. Tearful when discussing  son's passing. No pressured speech or tangential thought process.   ASSESSMENT/PLAN:   Herpes zoster without complication Ongoing, now ~6 months with postherpetic pain. Few crusted vesicles present on exam today though without evidence of superinfection and appears to be healing.  Pain control medication compliance has been an issue although willing to try SNRI for pain relief, Rx sent. Encouraged gabapentin use and topical diclofenac as well. Will f/u in 4 weeks, if still no relief after adequate trial of pain control, may consider referral to pain management at that time.   Grief Ongoing although not compliant with nortriptyline. Will trial venlafaxine in efforts to better control postherpetic pain as well. Coping mechanisms discussed today. Declined counseling. F/u in 4 weeks.    12/20/2019, DO

## 2020-06-23 ENCOUNTER — Encounter: Payer: Self-pay | Admitting: Family Medicine

## 2020-06-23 NOTE — Assessment & Plan Note (Signed)
Ongoing although not compliant with nortriptyline. Will trial venlafaxine in efforts to better control postherpetic pain as well. Coping mechanisms discussed today. Declined counseling. F/u in 4 weeks.

## 2020-07-15 ENCOUNTER — Other Ambulatory Visit: Payer: Self-pay | Admitting: Family Medicine

## 2020-07-19 ENCOUNTER — Encounter: Payer: Self-pay | Admitting: Family Medicine

## 2020-07-19 ENCOUNTER — Other Ambulatory Visit: Payer: Self-pay

## 2020-07-19 ENCOUNTER — Ambulatory Visit (INDEPENDENT_AMBULATORY_CARE_PROVIDER_SITE_OTHER): Payer: Medicare HMO | Admitting: Family Medicine

## 2020-07-19 VITALS — BP 157/87 | HR 76 | Temp 97.9°F

## 2020-07-19 DIAGNOSIS — F4321 Adjustment disorder with depressed mood: Secondary | ICD-10-CM

## 2020-07-19 DIAGNOSIS — R69 Illness, unspecified: Secondary | ICD-10-CM | POA: Diagnosis not present

## 2020-07-19 DIAGNOSIS — B0229 Other postherpetic nervous system involvement: Secondary | ICD-10-CM | POA: Diagnosis not present

## 2020-07-19 MED ORDER — VENLAFAXINE HCL ER 150 MG PO CP24: 150.0000 mg | ORAL_CAPSULE | Freq: Every day | ORAL | 0 refills | Status: DC

## 2020-07-19 NOTE — Assessment & Plan Note (Signed)
Ongoing. Will increase effexor to 150mg  daily. Continue gabapentin and diclofenac gel. F/u in 4 weeks. If no improvement, consider pain management referral.

## 2020-07-19 NOTE — Assessment & Plan Note (Signed)
Improved. Increasing effexor for postherpetic pain. F/u in 4 weeks.

## 2020-07-19 NOTE — Progress Notes (Signed)
    SUBJECTIVE:   CHIEF COMPLAINT / HPI:   Past Medical History:  Diagnosis Date  . History of shingles   . Knee pain   . Migraines    Postherpetic pain - Seen initially in May 2021 with severe shingles to R face, neck. Several ED and office visits for persistent pain, most recently 9/7. Previously on 3 rounds of valtrex and prednisone. - Prescribed gabapentin, diclofenac gel, nortriptyline, tylenol prn - switched nortriptyline to venlafaxine at last visit. - shingles vaccine, had 1 of 2 doses 3 months ago. - no new blisters.  - pain persistent.  Grief - Trigger: recent passing of son. Lucila Maine was taken away by biological mother after passing of father, doesn't get to see very often, previously helped raise him.  - Did get to see grandson over Christmas, doing better. - Medications: venlafaxine 75mg , xanax 0.5mg  daily prn - Taking: not taking xanax.  - Counseling: no, refused. - Previous hospitalizations: no  OBJECTIVE:   BP (!) 157/87   Pulse 76   Temp 97.9 F (36.6 C)   SpO2 97%   Gen: overweight, in NAD Skin: erythematous rash noted to lower R side of face and scalp with well healing lesions noted to chin and scalp. No blisters or pustules present.  Psych: well groomed and appropriately dressed. No pressured speech or tangential thought process.  ASSESSMENT/PLAN:   Grief Improved. Increasing effexor for postherpetic pain. F/u in 4 weeks.   Postherpetic neuralgia Ongoing. Will increase effexor to 150mg  daily. Continue gabapentin and diclofenac gel. F/u in 4 weeks. If no improvement, consider pain management referral.   , DO

## 2020-07-19 NOTE — Patient Instructions (Signed)
It was great to see you!  Our plans for today:  - Take 2 pills of your 75mg  venlafaxine to make 150mg  until you run out. I sent a new prescription for 150mg  pills to your pharmacy.  - Follow up in one month.  Take care and seek immediate care sooner if you develop any concerns.   Dr. 

## 2020-08-01 ENCOUNTER — Ambulatory Visit: Payer: Self-pay | Admitting: *Deleted

## 2020-08-01 NOTE — Telephone Encounter (Signed)
Second attempted to reach daughter- left message to call back

## 2020-08-01 NOTE — Telephone Encounter (Signed)
Summary: Rash    Pt has had a headache for 2 days and an "outbreak" on her face. Described further as a rash, no swelling, has not spread. Scheduled for appt.   Best contact: [phone]      Attempted to call patient's daughter and patient - left message on voicemail to return call to office.

## 2020-08-01 NOTE — Telephone Encounter (Signed)
Pt has had a headache for 2 days and an "outbreak" on her face. Described further as a rash, no swelling, has not spread. Scheduled for appt.      Called patient, no answer. Left voicemail to call clinic back if needed. appt scheduled for 08/03/20.

## 2020-08-03 ENCOUNTER — Telehealth: Payer: Self-pay

## 2020-08-03 ENCOUNTER — Encounter: Payer: Self-pay | Admitting: Family Medicine

## 2020-08-03 ENCOUNTER — Telehealth (INDEPENDENT_AMBULATORY_CARE_PROVIDER_SITE_OTHER): Payer: Medicare HMO | Admitting: Family Medicine

## 2020-08-03 DIAGNOSIS — J069 Acute upper respiratory infection, unspecified: Secondary | ICD-10-CM

## 2020-08-03 DIAGNOSIS — B0229 Other postherpetic nervous system involvement: Secondary | ICD-10-CM | POA: Diagnosis not present

## 2020-08-03 DIAGNOSIS — U071 COVID-19: Secondary | ICD-10-CM | POA: Diagnosis not present

## 2020-08-03 MED ORDER — VENLAFAXINE HCL ER 75 MG PO CP24: 75.0000 mg | ORAL_CAPSULE | Freq: Every day | ORAL | 0 refills | Status: DC

## 2020-08-03 MED ORDER — METHYLPREDNISOLONE 4 MG PO TBPK: ORAL_TABLET | ORAL | 0 refills | Status: DC

## 2020-08-03 MED ORDER — HYDROXYZINE HCL 25 MG PO TABS: 25.0000 mg | ORAL_TABLET | Freq: Three times a day (TID) | ORAL | 2 refills | Status: DC | PRN

## 2020-08-03 NOTE — Telephone Encounter (Signed)
Yes! She's right, I totally forgot to send in the atarax. It's sent now, sorry about that!

## 2020-08-03 NOTE — Telephone Encounter (Signed)
Dr. Linwood Dibbles, were other prescriptions supposed to be sent in? I only saw the Prednisone.

## 2020-08-03 NOTE — Telephone Encounter (Signed)
Copied from CRM [id number]. Topic: General - Other >> Aug 03, 2020 11:16 AM Marylen Ponto wrote: Reason for CRM: Pt daughter Elvin So stated they contacted [facility] regarding the 3 prescriptions that were to be called in but they were advised that only 2 prescriptions were received. Pt daughter requests that the 3rd Rx be sent to [facility]

## 2020-08-03 NOTE — Assessment & Plan Note (Signed)
Difficult to fully evaluate given inability to visualize over phone. Rash worsened, likely 2/2 to stress from current URI. Will provide steroid dose pack. Will wean down venlafaxine per patient request, counseled this can make anxiety and pain worse. F/u in person s/p steroids if pain and rash persists if COVID negative.  If pain not well controlled, consider derm/pain management referral given prior failure of gabapentin, SNRI, TCA, diclofenac gel.

## 2020-08-03 NOTE — Progress Notes (Signed)
Virtual Visit Note  I connected with [name] on 08/03/20 at 10:00 AM EST by a video enabled telemedicine application and verified that I am speaking with the correct person using two identifiers.  Location: Patient: home Provider: [facility]   I discussed the limitations of evaluation and management by telemedicine and the availability of in person appointments. The patient expressed understanding and agreed to proceed.  History of Present Illness:  Postherpetic neuralgia - Seen initially in May 2021 with severe shingles to R face, neck. Several ED and office visits for persistent pain, most recently 12/28. Previously on 3 rounds of valtrex and prednisone. - recently increased effexor to 150mg  for pain control. Also on gabapentin, diclofenac gel, tylenol prn. - doesn't feel venlafaxine is helping, is concerned is making rash worse, wants to discontinue. - rash is spreading, now on eyes, and chin. No new blisters, pustules or open wounds. Itching.  - shingles vaccine, had 1 of 2 doses 3 monthsago.  UPPER RESPIRATORY TRACT INFECTION - symptom onset 08/01/19 - unvaccinated against COVID Fever: no Cough: yes, productive Shortness of breath: no Wheezing: no Chest pain: no Chest tightness: no Chest congestion: no Nasal congestion: no Runny nose: yes Headache: yes Face pain: yes Ear pain: no  Ear pressure: no  Vomiting: no Rash: yes Sick contacts: no Context: stable Recurrent sinusitis: no Relief with OTC cold/cough medications: hasn't tried Treatments attempted: tylenol     Observations/Objective:  Patient had trouble connecting to video visit, entirety of visit conducted over the phone.  Speaks in full sentences, no respiratory distress.   Assessment and Plan:  Postherpetic neuralgia Difficult to fully evaluate given inability to visualize over phone. Rash worsened, likely 2/2 to stress from current URI. Will provide steroid dose pack. Will wean down venlafaxine per  patient request, counseled this can make anxiety and pain worse. F/u in person s/p steroids if pain and rash persists if COVID negative.  If pain not well controlled, consider derm/pain management referral given prior failure of gabapentin, SNRI, TCA, diclofenac gel.   Viral URI Will test for COVID, would be candidate for [facility] referral given comorbidities and unvaccinated status. Provided steroid dose pack for rash, will likely help with congestion. Reviewed other OTC symptom relief, self-quarantine, and emergency precautions.     I discussed the assessment and treatment plan with the patient. The patient was provided an opportunity to ask questions and all were answered. The patient agreed with the plan and demonstrated an understanding of the instructions.   The patient was advised to call back or seek an in-person evaluation if the symptoms worsen or if the condition fails to improve as anticipated.  I provided 19 minutes of non-face-to-face time during this encounter.   09/29/19, DO

## 2020-08-03 NOTE — Patient Instructions (Signed)
It was great to see you!  Our plans for today:  - We are decreasing your venlafaxine per your request. Take the 75mg  dose for the next couple weeks before discontinuing. Keep in mind, this can make your anxiety and pain worse. - Your rash has likely worsened due to your current upper respiratory infection. We are providing a steroid to help with your rash. - If your COVID test is negative and you are still having issues with your rash after completing the steroids, come for an inperson appointment to see .  - We are testing you for COVID.  - See below for self-isolation guidelines. You may end your quarantine once you are 10 days from symptom onset and fever free for 24 hours without use of tylenol or ibuprofen.  - I recommend getting vaccinated for COVID once you are healed from your current infection. If you get the antibody infusion, you must wait 3 months before vaccination.  - Certainly, if you are having difficulties breathing or unable to keep down fluids, go to the Emergency Department.   Take care and seek immediate care sooner if you develop any concerns.   Dr. [place]      Person Under Monitoring Name: [name]  Location: [address] [place]   Infection Prevention Recommendations for Individuals Confirmed to have, or Being Evaluated for, 2019 Novel Coronavirus (COVID-19) Infection Who Receive Care at Home  Individuals who are confirmed to have, or are being evaluated for, COVID-19 should follow the prevention steps below until a healthcare provider or local or state health department says they can return to normal activities.  Stay home except to get medical care You should restrict activities outside your home, except for getting medical care. Do not go to work, school, or public areas, and do not use public transportation or taxis.  Call ahead before visiting your doctor Before your medical appointment, call the healthcare provider and tell  them that you have, or are being evaluated for, COVID-19 infection. This will help the healthcare provider's office take steps to keep other people from getting infected. Ask your healthcare provider to call the local or state health department.  Monitor your symptoms Seek prompt medical attention if your illness is worsening (e.g., difficulty breathing). Before going to your medical appointment, call the healthcare provider and tell them that you have, or are being evaluated for, COVID-19 infection. Ask your healthcare provider to call the local or state health department.  Wear a facemask You should wear a facemask that covers your nose and mouth when you are in the same room with other people and when you visit a healthcare provider. People who live with or visit you should also wear a facemask while they are in the same room with you.  Separate yourself from other people in your home As much as possible, you should stay in a different room from other people in your home. Also, you should use a separate bathroom, if available.  Avoid sharing household items You should not share dishes, drinking glasses, cups, eating utensils, towels, bedding, or other items with other people in your home. After using these items, you should wash them thoroughly with soap and water.  Cover your coughs and sneezes Cover your mouth and nose with a tissue when you cough or sneeze, or you can cough or sneeze into your sleeve. Throw used tissues in a lined trash can, and immediately wash your hands with soap and water for  at least 20 seconds or use an alcohol-based hand rub.  Wash your [employer] your hands often and thoroughly with soap and water for at least 20 seconds. You can use an alcohol-based hand sanitizer if soap and water are not available and if your hands are not visibly dirty. Avoid touching your eyes, nose, and mouth with unwashed hands.   Prevention Steps for Caregivers and Household  Members of Individuals Confirmed to have, or Being Evaluated for, COVID-19 Infection Being Cared for in the Home  If you live with, or provide care at home for, a person confirmed to have, or being evaluated for, COVID-19 infection please follow these guidelines to prevent infection:  Follow healthcare provider's instructions Make sure that you understand and can help the patient follow any healthcare provider instructions for all care.  Provide for the patient's basic needs You should help the patient with basic needs in the home and provide support for getting groceries, prescriptions, and other personal needs.  Monitor the patient's symptoms If they are getting sicker, call his or her medical provider and tell them that the patient has, or is being evaluated for, COVID-19 infection. This will help the healthcare provider's office take steps to keep other people from getting infected. Ask the healthcare provider to call the local or state health department.  Limit the number of people who have contact with the patient  If possible, have only one caregiver for the patient.  Other household members should stay in another home or place of residence. If this is not possible, they should stay  in another room, or be separated from the patient as much as possible. Use a separate bathroom, if available.  Restrict visitors who do not have an essential need to be in the home.  Keep older adults, very young children, and other sick people away from the patient Keep older adults, very young children, and those who have compromised immune systems or chronic health conditions away from the patient. This includes people with chronic heart, lung, or kidney conditions, diabetes, and cancer.  Ensure good ventilation Make sure that shared spaces in the home have good air flow, such as from an air conditioner or an opened window, weather permitting.  Wash your hands often  Wash your hands often  and thoroughly with soap and water for at least 20 seconds. You can use an alcohol based hand sanitizer if soap and water are not available and if your hands are not visibly dirty.  Avoid touching your eyes, nose, and mouth with unwashed hands.  Use disposable paper towels to dry your hands. If not available, use dedicated cloth towels and replace them when they become wet.  Wear a facemask and gloves  Wear a disposable facemask at all times in the room and gloves when you touch or have contact with the patient's blood, body fluids, and/or secretions or excretions, such as sweat, saliva, sputum, nasal mucus, vomit, urine, or feces.  Ensure the mask fits over your nose and mouth tightly, and do not touch it during use.  Throw out disposable facemasks and gloves after using them. Do not reuse.  Wash your hands immediately after removing your facemask and gloves.  If your personal clothing becomes contaminated, carefully remove clothing and launder. Wash your hands after handling contaminated clothing.  Place all used disposable facemasks, gloves, and other waste in a lined container before disposing them with other household waste.  Remove gloves and wash your hands immediately after handling these  items.  Do not share dishes, glasses, or other household items with the patient  Avoid sharing household items. You should not share dishes, drinking glasses, cups, eating utensils, towels, bedding, or other items with a patient who is confirmed to have, or being evaluated for, COVID-19 infection.  After the person uses these items, you should wash them thoroughly with soap and water.  Wash laundry thoroughly  Immediately remove and wash clothes or bedding that have blood, body fluids, and/or secretions or excretions, such as sweat, saliva, sputum, nasal mucus, vomit, urine, or feces, on them.  Wear gloves when handling laundry from the patient.  Read and follow directions on labels of  laundry or clothing items and detergent. In general, wash and dry with the warmest temperatures recommended on the label.  Clean all areas the individual has used often  Clean all touchable surfaces, such as counters, tabletops, doorknobs, bathroom fixtures, toilets, phones, keyboards, tablets, and bedside tables, every day. Also, clean any surfaces that may have blood, body fluids, and/or secretions or excretions on them.  Wear gloves when cleaning surfaces the patient has come in contact with.  Use a diluted bleach solution (e.g., dilute bleach with 1 part bleach and 10 parts water) or a household disinfectant with a label that says EPA-registered for coronaviruses. To make a bleach solution at home, add 1 tablespoon of bleach to 1 quart (4 cups) of water. For a larger supply, add  cup of bleach to 1 gallon (16 cups) of water.  Read labels of cleaning products and follow recommendations provided on product labels. Labels contain instructions for safe and effective use of the cleaning product including precautions you should take when applying the product, such as wearing gloves or eye protection and making sure you have good ventilation during use of the product.  Remove gloves and wash hands immediately after cleaning.  Monitor yourself for signs and symptoms of illness Caregivers and household members are considered close contacts, should monitor their health, and will be asked to limit movement outside of the home to the extent possible. Follow the monitoring steps for close contacts listed on the symptom monitoring form.   ? If you have additional questions, contact your local health department or call the epidemiologist on call at [phone] (available 24/7). ? This guidance is subject to change. For the most up-to-date guidance from [facility], please refer to their website: [email or web address]

## 2020-08-03 NOTE — Addendum Note (Signed)
Addended by: Caro Laroche on: 08/03/2020 02:16 PM   Modules accepted: Orders

## 2020-08-03 NOTE — Telephone Encounter (Signed)
Called and LVM apologizing for this not being sent in and let them know that it has been sent in now.

## 2020-08-03 NOTE — Assessment & Plan Note (Addendum)
Will test for COVID, would be candidate for [facility] referral given comorbidities and unvaccinated status. Provided steroid dose pack for rash, will likely help with congestion. Reviewed other OTC symptom relief, self-quarantine, and emergency precautions.

## 2020-08-05 ENCOUNTER — Ambulatory Visit: Payer: Self-pay | Admitting: *Deleted

## 2020-08-05 LAB — NOVEL CORONAVIRUS, NAA: SARS-CoV-2, NAA: DETECTED — AB

## 2020-08-05 LAB — SARS-COV-2, NAA 2 DAY TAT

## 2020-08-05 NOTE — Telephone Encounter (Signed)
Patient notified of positive COVID-19 test results. Pt verbalized understanding. Pt reports symptoms of  Cough. Criteria for self-isolation if you test positive for COVID-19, regardless of vaccination status:  -If you have mild symptoms that are resolving or have resolved, isolate at home for 5 days since symptoms started AND continue to wear a well-fitted mask when around others in the home and in public for 5 additional days after isolation is completed -If you have a fever and/or moderate to severe symptoms, isolate for at least 10 days since the symptoms started AND until you are fever free for at least 24 hours without the use of fever-reducing medications -If you tested positive and did not have symptoms, isolate for at least 5 days after your positive test  Use over-the-counter medications for symptoms.If you develop respiratory issues/distress, seek medical care in the Emergency Department.  If you must leave home or if you have to be around others please wear a mask. Please limit contact with immediate family members in the home, practice social distancing, frequent handwashing and clean hard surfaces touched frequently with household cleaning products. Members of your household will also need to quarantine and test.You may also be contacted by the health department for follow up. [facility] Department notified.

## 2020-08-05 NOTE — Addendum Note (Signed)
Addended by: Caro Laroche on: 08/05/2020 01:40 PM   Modules accepted: Orders

## 2020-08-06 ENCOUNTER — Telehealth: Payer: Self-pay | Admitting: Family

## 2020-08-06 NOTE — Telephone Encounter (Signed)
Called to discuss with patient about COVID-19 symptoms and the use of one of the available treatments for those with mild to moderate Covid symptoms and at a high risk of hospitalization.  Pt appears to qualify for outpatient treatment due to co-morbid conditions and/or a member of an at-risk group in accordance with the FDA Emergency Use Authorization.    Symptom onset: 07/31/20 Vaccinated: No Booster? No Immunocompromised? No Qualifiers: Age, BMI >25. Elevated SVI score  I was unable to reach [name] via phone and left a voicemail with the call back hotline number. No MyChart is available. Today is day 7 of symptoms.   Sue Eke, NP 08/06/2020 10:52 AM

## 2020-08-08 ENCOUNTER — Ambulatory Visit: Payer: Medicare HMO | Admitting: Family Medicine

## 2020-08-10 ENCOUNTER — Other Ambulatory Visit: Payer: Self-pay | Admitting: Family Medicine

## 2020-08-10 ENCOUNTER — Ambulatory Visit: Payer: Self-pay | Admitting: *Deleted

## 2020-08-10 ENCOUNTER — Telehealth: Payer: Self-pay | Admitting: Family Medicine

## 2020-08-10 MED ORDER — ONDANSETRON HCL 4 MG PO TABS: 4.0000 mg | ORAL_TABLET | Freq: Three times a day (TID) | ORAL | 0 refills | Status: AC | PRN

## 2020-08-10 MED ORDER — BENZONATATE 200 MG PO CAPS
200.0000 mg | ORAL_CAPSULE | Freq: Two times a day (BID) | ORAL | 0 refills | Status: DC | PRN
Start: 2020-08-10 — End: 2021-01-19

## 2020-08-10 NOTE — Telephone Encounter (Signed)
Pt daughter called to see if provider can send Tessalon Pearls for the day and cough syrup to help with sleep and cough at night/ pt was diagnosed with covid and the cough is bad for her/ please advise asap

## 2020-08-10 NOTE — Telephone Encounter (Signed)
[name] daughter calling in.  Pt is on new medicine for rash on face.  Tried a televisit but it didn't work.   She was given prednisone and a nerve medicine.   Her covid test is positive. She is now coughing.   Elvin So called this morning for medication for her mother.   She's coughing so bad. She needs nausea pills, Tessilon Pearles, and stronger cough medicine for night.     Monday or Tues. She talked with dr because video chat not work.  We called the ED and they told her she had to wait outside if you have covid at [facility]  Elvin So is asking for a call back.  It looks like Elvin So and her mother Jalon did a phone visit with Dr. Linwood Dibbles yesterday so she is aware of what is going on with [place].     Reason for Disposition . [1] Continuous (nonstop) coughing interferes with work or school AND [2] no improvement using cough treatment per protocol  Answer Assessment - Initial Assessment Questions 1. ONSET: "When did the cough begin?"      Covid positive.  Coughing a dry cough real bad.  Sometimes yellow sputum comes up.   She is using Mucinex Extra Strength is not working. 2. SEVERITY: "How bad is the cough today?"      Very bad 3. SPUTUM: "Describe the color of your sputum" (none, dry cough; clear, white, yellow, green)     Sometimes yellow mucus comes up 4. HEMOPTYSIS: "Are you coughing up any blood?" If so ask: "How much?" (flecks, streaks, tablespoons, etc.)     No 5. DIFFICULTY BREATHING: "Are you having difficulty breathing?" If Yes, ask: "How bad is it?" (e.g., mild, moderate, severe)    - MILD: No SOB at rest, mild SOB with walking, speaks normally in sentences, can lay down, no retractions, pulse < 100.    - MODERATE: SOB at rest, SOB with minimal exertion and prefers to sit, cannot lie down flat, speaks in phrases, mild retractions, audible wheezing, pulse 100-120.    - SEVERE: Very SOB at rest, speaks in single words, struggling to breathe, sitting hunched forward,  retractions, pulse > 120      Only with coughing can't get breath 6. FEVER: "Do you have a fever?" If Yes, ask: "What is your temperature, how was it measured, and when did it start?"     No fever 7. CARDIAC HISTORY: "Do you have any history of heart disease?" (e.g., heart attack, congestive heart failure)      *No Answer* 8. LUNG HISTORY: "Do you have any history of lung disease?"  (e.g., pulmonary embolus, asthma, emphysema)     *No Answer* 9. PE RISK FACTORS: "Do you have a history of blood clots?" (or: recent major surgery, recent prolonged travel, bedridden)     *No Answer* 10. OTHER SYMPTOMS: "Do you have any other symptoms?" (e.g., runny nose, wheezing, chest pain)       *No Answer* 11. PREGNANCY: "Is there any chance you are pregnant?" "When was your last menstrual period?"       *No Answer* 12. TRAVEL: "Have you traveled out of the country in the last month?" (e.g., travel history, exposures)       *No Answer*  Protocols used: COUGH - ACUTE NON-PRODUCTIVE-A-AH

## 2020-08-10 NOTE — Telephone Encounter (Signed)
Called and left a detailed message for patient.  

## 2020-08-10 NOTE — Telephone Encounter (Signed)
Please let patient know tessalon perles and zofran called into [facility].

## 2020-08-10 NOTE — Telephone Encounter (Signed)
Lvm to make apt.  

## 2020-08-13 DIAGNOSIS — I5030 Unspecified diastolic (congestive) heart failure: Secondary | ICD-10-CM | POA: Diagnosis not present

## 2020-08-13 DIAGNOSIS — R21 Rash and other nonspecific skin eruption: Secondary | ICD-10-CM | POA: Diagnosis not present

## 2020-08-13 DIAGNOSIS — I110 Hypertensive heart disease with heart failure: Secondary | ICD-10-CM | POA: Diagnosis not present

## 2020-08-13 DIAGNOSIS — R059 Cough, unspecified: Secondary | ICD-10-CM | POA: Diagnosis not present

## 2020-08-13 DIAGNOSIS — R918 Other nonspecific abnormal finding of lung field: Secondary | ICD-10-CM | POA: Diagnosis not present

## 2020-08-13 DIAGNOSIS — I4891 Unspecified atrial fibrillation: Secondary | ICD-10-CM | POA: Diagnosis not present

## 2020-08-13 DIAGNOSIS — B0229 Other postherpetic nervous system involvement: Secondary | ICD-10-CM | POA: Diagnosis not present

## 2020-08-13 DIAGNOSIS — I10 Essential (primary) hypertension: Secondary | ICD-10-CM | POA: Diagnosis not present

## 2020-08-13 DIAGNOSIS — I351 Nonrheumatic aortic (valve) insufficiency: Secondary | ICD-10-CM | POA: Diagnosis not present

## 2020-08-13 DIAGNOSIS — Z6841 Body Mass Index (BMI) 40.0 and over, adult: Secondary | ICD-10-CM | POA: Diagnosis not present

## 2020-08-13 DIAGNOSIS — J189 Pneumonia, unspecified organism: Secondary | ICD-10-CM | POA: Diagnosis not present

## 2020-08-13 DIAGNOSIS — M791 Myalgia, unspecified site: Secondary | ICD-10-CM | POA: Diagnosis not present

## 2020-08-13 DIAGNOSIS — E049 Nontoxic goiter, unspecified: Secondary | ICD-10-CM | POA: Diagnosis not present

## 2020-08-13 DIAGNOSIS — U071 COVID-19: Secondary | ICD-10-CM | POA: Diagnosis not present

## 2020-08-13 DIAGNOSIS — B0222 Postherpetic trigeminal neuralgia: Secondary | ICD-10-CM | POA: Diagnosis not present

## 2020-08-13 DIAGNOSIS — R0602 Shortness of breath: Secondary | ICD-10-CM | POA: Diagnosis not present

## 2020-08-13 DIAGNOSIS — I5031 Acute diastolic (congestive) heart failure: Secondary | ICD-10-CM | POA: Diagnosis not present

## 2020-08-13 DIAGNOSIS — I272 Pulmonary hypertension, unspecified: Secondary | ICD-10-CM | POA: Diagnosis not present

## 2020-08-13 DIAGNOSIS — E876 Hypokalemia: Secondary | ICD-10-CM | POA: Diagnosis not present

## 2020-08-13 DIAGNOSIS — I480 Paroxysmal atrial fibrillation: Secondary | ICD-10-CM | POA: Diagnosis not present

## 2020-08-14 DIAGNOSIS — I480 Paroxysmal atrial fibrillation: Secondary | ICD-10-CM | POA: Diagnosis not present

## 2020-08-14 DIAGNOSIS — I10 Essential (primary) hypertension: Secondary | ICD-10-CM | POA: Diagnosis not present

## 2020-08-14 DIAGNOSIS — B0222 Postherpetic trigeminal neuralgia: Secondary | ICD-10-CM | POA: Diagnosis not present

## 2020-08-14 DIAGNOSIS — R21 Rash and other nonspecific skin eruption: Secondary | ICD-10-CM | POA: Diagnosis not present

## 2020-08-14 DIAGNOSIS — U071 COVID-19: Secondary | ICD-10-CM | POA: Diagnosis not present

## 2020-08-15 DIAGNOSIS — I351 Nonrheumatic aortic (valve) insufficiency: Secondary | ICD-10-CM | POA: Diagnosis not present

## 2020-08-15 DIAGNOSIS — R918 Other nonspecific abnormal finding of lung field: Secondary | ICD-10-CM | POA: Diagnosis not present

## 2020-08-15 DIAGNOSIS — I10 Essential (primary) hypertension: Secondary | ICD-10-CM | POA: Diagnosis not present

## 2020-08-15 DIAGNOSIS — R059 Cough, unspecified: Secondary | ICD-10-CM | POA: Diagnosis not present

## 2020-08-15 DIAGNOSIS — I4891 Unspecified atrial fibrillation: Secondary | ICD-10-CM | POA: Diagnosis not present

## 2020-08-15 DIAGNOSIS — I272 Pulmonary hypertension, unspecified: Secondary | ICD-10-CM | POA: Diagnosis not present

## 2020-08-15 DIAGNOSIS — R21 Rash and other nonspecific skin eruption: Secondary | ICD-10-CM | POA: Diagnosis not present

## 2020-08-15 DIAGNOSIS — I480 Paroxysmal atrial fibrillation: Secondary | ICD-10-CM | POA: Diagnosis not present

## 2020-08-15 DIAGNOSIS — B0222 Postherpetic trigeminal neuralgia: Secondary | ICD-10-CM | POA: Diagnosis not present

## 2020-08-15 DIAGNOSIS — U071 COVID-19: Secondary | ICD-10-CM | POA: Diagnosis not present

## 2020-08-16 DIAGNOSIS — U071 COVID-19: Secondary | ICD-10-CM | POA: Diagnosis not present

## 2020-08-16 DIAGNOSIS — I480 Paroxysmal atrial fibrillation: Secondary | ICD-10-CM | POA: Diagnosis not present

## 2020-08-16 DIAGNOSIS — I10 Essential (primary) hypertension: Secondary | ICD-10-CM | POA: Diagnosis not present

## 2020-08-16 DIAGNOSIS — I5030 Unspecified diastolic (congestive) heart failure: Secondary | ICD-10-CM | POA: Diagnosis not present

## 2020-08-16 DIAGNOSIS — B0222 Postherpetic trigeminal neuralgia: Secondary | ICD-10-CM | POA: Diagnosis not present

## 2020-08-16 DIAGNOSIS — R21 Rash and other nonspecific skin eruption: Secondary | ICD-10-CM | POA: Diagnosis not present

## 2020-08-17 DIAGNOSIS — U071 COVID-19: Secondary | ICD-10-CM | POA: Diagnosis not present

## 2020-08-17 DIAGNOSIS — I5030 Unspecified diastolic (congestive) heart failure: Secondary | ICD-10-CM | POA: Diagnosis not present

## 2020-08-17 DIAGNOSIS — I480 Paroxysmal atrial fibrillation: Secondary | ICD-10-CM | POA: Diagnosis not present

## 2020-08-18 ENCOUNTER — Telehealth: Payer: Self-pay | Admitting: Nurse Practitioner

## 2020-08-18 ENCOUNTER — Ambulatory Visit: Payer: Medicare HMO | Admitting: Family Medicine

## 2020-08-18 NOTE — Telephone Encounter (Signed)
Patient's daughter is calling Tiffany back. Please advise

## 2020-08-18 NOTE — Telephone Encounter (Unsigned)
Copied from CRM [id number]. Topic: General - Other >> Aug 18, 2020  2:14 PM Pawlus, Maxine Glenn A wrote: Reason for CRM: Daughter called in on behalf of PT, she is seeking advice regarding her mothers medicine and is unsure if she should continue her previous medications as well as the new prescriptions from ED.

## 2020-08-18 NOTE — Telephone Encounter (Signed)
Called and left a message for patients daughter to return my call.

## 2020-08-19 NOTE — Telephone Encounter (Signed)
You spoke with this patient

## 2020-08-21 ENCOUNTER — Telehealth: Payer: Self-pay | Admitting: Pharmacist

## 2020-08-21 NOTE — Telephone Encounter (Signed)
Patient and son called requesting advice regarding any interactions with new discharge medications for afib and current medication list.  Reviewed current medication list and newly prescribed medication list. No significant interactions identified. Patient hospital visit follow-up scheduled 08/24/20.

## 2020-08-22 DIAGNOSIS — I4891 Unspecified atrial fibrillation: Secondary | ICD-10-CM | POA: Diagnosis not present

## 2020-08-22 DIAGNOSIS — I5030 Unspecified diastolic (congestive) heart failure: Secondary | ICD-10-CM | POA: Diagnosis not present

## 2020-08-24 ENCOUNTER — Ambulatory Visit (INDEPENDENT_AMBULATORY_CARE_PROVIDER_SITE_OTHER): Payer: Medicare HMO | Admitting: Family Medicine

## 2020-08-24 ENCOUNTER — Other Ambulatory Visit: Payer: Self-pay

## 2020-08-24 ENCOUNTER — Encounter: Payer: Self-pay | Admitting: Family Medicine

## 2020-08-24 DIAGNOSIS — J1282 Pneumonia due to coronavirus disease 2019: Secondary | ICD-10-CM | POA: Insufficient documentation

## 2020-08-24 DIAGNOSIS — I4891 Unspecified atrial fibrillation: Secondary | ICD-10-CM | POA: Insufficient documentation

## 2020-08-24 DIAGNOSIS — U071 COVID-19: Secondary | ICD-10-CM | POA: Diagnosis not present

## 2020-08-24 DIAGNOSIS — I5030 Unspecified diastolic (congestive) heart failure: Secondary | ICD-10-CM | POA: Insufficient documentation

## 2020-08-24 NOTE — Assessment & Plan Note (Signed)
Remains in afib today. Continues on eliquis, planning for cardioversion. Continue to follow with Cardiology.

## 2020-08-24 NOTE — Assessment & Plan Note (Signed)
New onset during hospitalization. Continue spiro, BB and maintain Cardiology follow up.

## 2020-08-24 NOTE — Patient Instructions (Signed)
It was great to see you!  Our plans for today:  - No changes to your medications today.  - Follow up with the cardiologist as scheduled.   Take care and seek immediate care sooner if you develop any concerns.   Dr. Linwood Dibbles

## 2020-08-24 NOTE — Progress Notes (Signed)
   SUBJECTIVE:   CHIEF COMPLAINT / HPI:   HOSPITAL FOLLOW UP Time since discharge: 1 week Hospital/facility: [facility] 1/22-1/26 Diagnosis: new onset Afib w/ RVR, HFpEF s/p COVID - diuresis, remdesivir, dexamethasone Procedures/tests:  - ECHO LVEF 60-65%, borderline pHTN. - CXR multifocal PNA - CTA PE neg Consultants: Cardiology New medications:  - albuterol inhaler, eliquis 5mg  BID, metoprolol 50mg  daily, spironolactone 25mg  daily Discharge instructions:  F/u with PCP, Cards Status: better  Still with occasional cough. Using albuterol inhaler few times per day, helping. Breathing has improved. Not checking BP at home.  Denies palpitations, chest pain.    OBJECTIVE:   BP 126/80   Pulse (!) 102   Temp 98.5 F (36.9 C)   Wt 298 lb (135.2 kg)   SpO2 97%   BMI 49.59 kg/m   Gen: chronically ill appearing, in NAD Card: irregular rhythm, reg rate Lungs: few bibasilar crackles that clear with cough Ext: WWP, no edema  ASSESSMENT/PLAN:   Atrial fibrillation (HCC) Remains in afib today. Continues on eliquis, planning for cardioversion. Continue to follow with Cardiology.  Pneumonia due to COVID-19 virus Improved with residual occasional cough. Lungs clear today. Continue to monitor.  (HFpEF) heart failure with preserved ejection fraction (HCC) New onset during hospitalization. Continue spiro, BB and maintain Cardiology follow up.    , DO

## 2020-08-24 NOTE — Assessment & Plan Note (Signed)
Improved with residual occasional cough. Lungs clear today. Continue to monitor.

## 2020-08-30 ENCOUNTER — Other Ambulatory Visit: Payer: Self-pay | Admitting: Family Medicine

## 2020-08-30 DIAGNOSIS — B0229 Other postherpetic nervous system involvement: Secondary | ICD-10-CM

## 2020-08-30 NOTE — Telephone Encounter (Signed)
Requested Prescriptions  Pending Prescriptions Disp Refills  . venlafaxine XR (EFFEXOR-XR) 75 MG 24 hr capsule [Pharmacy Med Name: VENLAFAXINE HCL ER 75 MG CAP] 30 capsule 1    Sig: TAKE 1 CAPSULE BY MOUTH DAILY WITH BREAKFAST.     Psychiatry: Antidepressants - SNRI - desvenlafaxine & venlafaxine Failed - 08/30/2020  1:15 AM      Failed - LDL in normal range and within 360 days    LDL Chol Calc (NIH)  Date Value Ref Range Status  01/19/2020 119 (H) 0 - 99 mg/dL Final         Passed - Total Cholesterol in normal range and within 360 days    Cholesterol, Total  Date Value Ref Range Status  01/19/2020 191 100 - 199 mg/dL Final         Passed - Triglycerides in normal range and within 360 days    Triglycerides  Date Value Ref Range Status  01/19/2020 126 0 - 149 mg/dL Final         Passed - Last BP in normal range    BP Readings from Last 1 Encounters:  08/24/20 126/80         Passed - Valid encounter within last 6 months    Recent Outpatient Visits          6 days ago Atrial fibrillation, unspecified type [facility])   [facility] Caro Laroche, DO   3 weeks ago Viral URI   [facility] Caro Laroche, DO   1 month ago Grief   [facility] Caro Laroche, DO   2 months ago Grief   [facility] Ellwood Dense M, DO   5 months ago Herpes zoster without complication   [facility] Valentino Nose, NP      Future Appointments            In 7 months [facility]

## 2020-09-06 DIAGNOSIS — I10 Essential (primary) hypertension: Secondary | ICD-10-CM | POA: Diagnosis not present

## 2020-09-06 DIAGNOSIS — I4891 Unspecified atrial fibrillation: Secondary | ICD-10-CM | POA: Diagnosis not present

## 2020-09-06 DIAGNOSIS — E669 Obesity, unspecified: Secondary | ICD-10-CM | POA: Diagnosis not present

## 2020-10-06 DIAGNOSIS — Z20822 Contact with and (suspected) exposure to covid-19: Secondary | ICD-10-CM | POA: Diagnosis not present

## 2020-10-21 DIAGNOSIS — Z20822 Contact with and (suspected) exposure to covid-19: Secondary | ICD-10-CM | POA: Diagnosis not present

## 2020-10-22 DIAGNOSIS — Z20822 Contact with and (suspected) exposure to covid-19: Secondary | ICD-10-CM | POA: Diagnosis not present

## 2020-10-23 DIAGNOSIS — Z20822 Contact with and (suspected) exposure to covid-19: Secondary | ICD-10-CM | POA: Diagnosis not present

## 2020-11-20 DIAGNOSIS — Z20822 Contact with and (suspected) exposure to covid-19: Secondary | ICD-10-CM | POA: Diagnosis not present

## 2020-11-21 DIAGNOSIS — Z20822 Contact with and (suspected) exposure to covid-19: Secondary | ICD-10-CM | POA: Diagnosis not present

## 2020-11-22 DIAGNOSIS — Z20822 Contact with and (suspected) exposure to covid-19: Secondary | ICD-10-CM | POA: Diagnosis not present

## 2020-12-21 DIAGNOSIS — Z20822 Contact with and (suspected) exposure to covid-19: Secondary | ICD-10-CM | POA: Diagnosis not present

## 2021-01-18 NOTE — Progress Notes (Signed)
BP (!) 147/67   Pulse 86   Temp 97.6 F (36.4 C) (Oral)   Wt (!) 307 lb 6.4 oz (139.4 kg)   SpO2 96%   BMI 51.15 kg/m    Subjective:    Patient ID: [name], female    DOB: 1951/05/14, 70 y.o.   MRN: [id number]  HPI: [name] is a 70 y.o. female  Chief Complaint  Patient presents with   Fall    Pt states she had a fall this past Friday and has been having R hip and R wrist pain since. Rating pain at an 8 today.     HIP PAIN Patient states she had a fall last Friday and Tuesday. Since then she has been having right hip and wrist pain.  Patient states she tripped and that is how she fell. Duration: days Involved hip: right  Mechanism of injury: trauma Location: lateral Onset: sudden  Severity: 8/10  Quality: sharp Frequency: constant Radiation: no Aggravating factors:  laying down and walking   Alleviating factors: NSAIDs  Status: stable Treatments attempted: ibuprofen   Relief with NSAIDs?: no Weakness with weight bearing: no Weakness with walking: no Paresthesias / decreased sensation: no Swelling: no Redness:no Fevers: no  Patient's daughter states she has been light headed and unstable on her feet. Patient states she is urinating okay.    Relevant past medical, surgical, family and social history reviewed and updated as indicated. Interim medical history since our last visit reviewed. Allergies and medications reviewed and updated.  Review of Systems  Constitutional:  Negative for fever.  Musculoskeletal:        Right hip and wrist pain  Neurological:  Positive for dizziness and light-headedness.   Per HPI unless specifically indicated above     Objective:    BP (!) 147/67   Pulse 86   Temp 97.6 F (36.4 C) (Oral)   Wt (!) 307 lb 6.4 oz (139.4 kg)   SpO2 96%   BMI 51.15 kg/m   Wt Readings from Last 3 Encounters:  01/19/21 (!) 307 lb 6.4 oz (139.4 kg)  08/24/20 298 lb (135.2 kg)  06/22/20 (!) 308 lb 8 oz (139.9 kg)     Physical Exam Vitals and nursing note reviewed.  Constitutional:      General: She is not in acute distress.    Appearance: Normal appearance. She is normal weight. She is not ill-appearing, toxic-appearing or diaphoretic.  HENT:     Head: Normocephalic.     Right Ear: External ear normal.     Left Ear: External ear normal.     Nose: Nose normal.     Mouth/Throat:     Mouth: Mucous membranes are moist.     Pharynx: Oropharynx is clear.  Eyes:     General:        Right eye: No discharge.        Left eye: No discharge.     Extraocular Movements: Extraocular movements intact.     Conjunctiva/sclera: Conjunctivae normal.     Pupils: Pupils are equal, round, and reactive to light.  Cardiovascular:     Rate and Rhythm: Normal rate and regular rhythm.     Heart sounds: No murmur heard. Pulmonary:     Effort: Pulmonary effort is normal. No respiratory distress.     Breath sounds: Normal breath sounds. No wheezing or rales.  Musculoskeletal:        General: Signs of injury present. No swelling.  Right upper arm: Tenderness present. No swelling, edema, deformity or lacerations.     Cervical back: Normal range of motion and neck supple.     Right lower leg: No edema.     Left lower leg: No edema.     Comments: Walking with a walker  Skin:    General: Skin is warm and dry.     Capillary Refill: Capillary refill takes less than 2 seconds.  Neurological:     General: No focal deficit present.     Mental Status: She is alert and oriented to person, place, and time. Mental status is at baseline.  Psychiatric:        Mood and Affect: Mood normal.        Behavior: Behavior normal.        Thought Content: Thought content normal.        Judgment: Judgment normal.    Results for orders placed or performed in visit on 08/03/20  Novel Coronavirus, NAA ([facility])   Specimen: Nasopharyngeal(NP) swabs in vial transport medium  Result Value Ref Range   SARS-CoV-2, NAA Detected (A) Not  Detected  SARS-COV-2, NAA 2 DAY TAT  Result Value Ref Range   SARS-CoV-2, NAA 2 DAY TAT Performed       Assessment & Plan:   Problem List Items Addressed This Visit   None Visit Diagnoses     Fall, initial encounter    -  Primary   CMP, CBC and urine will be checked to determine cause of dizziness.  Xrays of hip and wrist checked today. Will make recommendations based on lab results.    Relevant Orders   Comp Met (CMET)   CBC w/Diff   Urinalysis, Routine w reflex microscopic   Urine Culture   Hip pain       CMP, CBC and urine will be checked to determine cause of dizziness.  Xrays of hip and wrist checked today. Will make recommendations based on lab results.    Relevant Orders   DG Hip Unilat W OR W/O Pelvis 2-3 Views Right   Right wrist pain       CMP, CBC and urine will be checked to determine cause of dizziness.  Xrays of hip and wrist checked today. Will make recommendations based on lab results.    Relevant Orders   DG Wrist Complete Right   Dizziness       Relevant Orders   Comp Met (CMET)   CBC w/Diff   Urinalysis, Routine w reflex microscopic   Urine Culture        Follow up plan: Return if symptoms worsen or fail to improve.

## 2021-01-19 ENCOUNTER — Ambulatory Visit
Admission: RE | Admit: 2021-01-19 | Discharge: 2021-01-19 | Disposition: A | Discharge status: Home / Self Care | Payer: Medicare HMO | Source: Ambulatory Visit | Attending: Nurse Practitioner | Admitting: Nurse Practitioner

## 2021-01-19 ENCOUNTER — Ambulatory Visit
Admission: RE | Admit: 2021-01-19 | Discharge: 2021-01-19 | Disposition: A | Discharge status: Home / Self Care | Payer: Medicare HMO | Attending: Nurse Practitioner | Admitting: Nurse Practitioner

## 2021-01-19 ENCOUNTER — Other Ambulatory Visit: Payer: Self-pay

## 2021-01-19 ENCOUNTER — Ambulatory Visit (INDEPENDENT_AMBULATORY_CARE_PROVIDER_SITE_OTHER): Payer: Medicare HMO | Admitting: Nurse Practitioner

## 2021-01-19 ENCOUNTER — Encounter: Payer: Self-pay | Admitting: Nurse Practitioner

## 2021-01-19 VITALS — BP 147/67 | HR 86 | Temp 97.6°F | Wt 307.4 lb

## 2021-01-19 DIAGNOSIS — M25559 Pain in unspecified hip: Secondary | ICD-10-CM | POA: Diagnosis not present

## 2021-01-19 DIAGNOSIS — N3000 Acute cystitis without hematuria: Secondary | ICD-10-CM | POA: Diagnosis not present

## 2021-01-19 DIAGNOSIS — M25531 Pain in right wrist: Secondary | ICD-10-CM

## 2021-01-19 DIAGNOSIS — W19XXXA Unspecified fall, initial encounter: Secondary | ICD-10-CM

## 2021-01-19 DIAGNOSIS — R42 Dizziness and giddiness: Secondary | ICD-10-CM

## 2021-01-19 DIAGNOSIS — M25551 Pain in right hip: Secondary | ICD-10-CM | POA: Diagnosis not present

## 2021-01-19 DIAGNOSIS — R82998 Other abnormal findings in urine: Secondary | ICD-10-CM | POA: Diagnosis not present

## 2021-01-19 LAB — URINALYSIS, ROUTINE W REFLEX MICROSCOPIC
Bilirubin, UA: NEGATIVE
Glucose, UA: NEGATIVE
Ketones, UA: NEGATIVE
Leukocytes,UA: NEGATIVE
Nitrite, UA: POSITIVE — AB
Protein,UA: NEGATIVE
RBC, UA: NEGATIVE
Specific Gravity, UA: 1.015 (ref 1.005–1.030)
Urobilinogen, Ur: 1 mg/dL (ref 0.2–1.0)
pH, UA: 6.5 (ref 5.0–7.5)

## 2021-01-19 LAB — MICROSCOPIC EXAMINATION
RBC, Urine: NONE SEEN /hpf (ref 0–2)
WBC, UA: NONE SEEN /hpf (ref 0–5)

## 2021-01-20 ENCOUNTER — Telehealth: Payer: Self-pay | Admitting: Nurse Practitioner

## 2021-01-20 DIAGNOSIS — Z20822 Contact with and (suspected) exposure to covid-19: Secondary | ICD-10-CM | POA: Diagnosis not present

## 2021-01-20 LAB — CBC WITH DIFFERENTIAL/PLATELET
Basophils Absolute: 0 10*3/uL (ref 0.0–0.2)
Basos: 1 %
EOS (ABSOLUTE): 0.3 10*3/uL (ref 0.0–0.4)
Eos: 7 %
Hematocrit: 38.3 % (ref 34.0–46.6)
Hemoglobin: 12.9 g/dL (ref 11.1–15.9)
Immature Grans (Abs): 0 10*3/uL (ref 0.0–0.1)
Immature Granulocytes: 0 %
Lymphocytes Absolute: 0.8 10*3/uL (ref 0.7–3.1)
Lymphs: 22 %
MCH: 31.7 pg (ref 26.6–33.0)
MCHC: 33.7 g/dL (ref 31.5–35.7)
MCV: 94 fL (ref 79–97)
Monocytes Absolute: 0.4 10*3/uL (ref 0.1–0.9)
Monocytes: 11 %
Neutrophils Absolute: 2.2 10*3/uL (ref 1.4–7.0)
Neutrophils: 59 %
Platelets: 148 10*3/uL — ABNORMAL LOW (ref 150–450)
RBC: 4.07 x10E6/uL (ref 3.77–5.28)
RDW: 11.3 % — ABNORMAL LOW (ref 11.7–15.4)
WBC: 3.6 10*3/uL (ref 3.4–10.8)

## 2021-01-20 LAB — COMPREHENSIVE METABOLIC PANEL
ALT: 4 IU/L (ref 0–32)
AST: 9 IU/L (ref 0–40)
Albumin/Globulin Ratio: 1.7 (ref 1.2–2.2)
Albumin: 4 g/dL (ref 3.8–4.8)
Alkaline Phosphatase: 82 IU/L (ref 44–121)
BUN/Creatinine Ratio: 13 (ref 12–28)
BUN: 10 mg/dL (ref 8–27)
Bilirubin Total: 0.5 mg/dL (ref 0.0–1.2)
CO2: 26 mmol/L (ref 20–29)
Calcium: 9 mg/dL (ref 8.7–10.3)
Chloride: 101 mmol/L (ref 96–106)
Creatinine, Ser: 0.79 mg/dL (ref 0.57–1.00)
Globulin, Total: 2.3 g/dL (ref 1.5–4.5)
Glucose: 102 mg/dL — ABNORMAL HIGH (ref 65–99)
Potassium: 3.8 mmol/L (ref 3.5–5.2)
Sodium: 142 mmol/L (ref 134–144)
Total Protein: 6.3 g/dL (ref 6.0–8.5)
eGFR: 81 mL/min/{1.73_m2} (ref >59)

## 2021-01-20 NOTE — Telephone Encounter (Signed)
Called patient to make aware imaging has not been resulted. Will contact patient when provider results imaging. Patient understood.

## 2021-01-20 NOTE — Telephone Encounter (Signed)
Please advise 

## 2021-01-20 NOTE — Telephone Encounter (Signed)
Caller inquiring about imaging and lab results, please advise

## 2021-01-23 MED ORDER — NITROFURANTOIN MONOHYD MACRO 100 MG PO CAPS: 100.0000 mg | ORAL_CAPSULE | Freq: Two times a day (BID) | ORAL | 0 refills | Status: DC

## 2021-01-23 NOTE — Addendum Note (Signed)
Addended by: Larae Grooms on: 01/23/2021 09:41 PM   Modules accepted: Orders

## 2021-01-23 NOTE — Progress Notes (Signed)
Hi [name].  Your hip xray did not show a fracture. Let me know if you have further questions.

## 2021-01-23 NOTE — Progress Notes (Signed)
Please let patient and her daughter know that she does have a UTI.  This could be causing her dizziness and falls.  I have sent in Macrobid to treat the UTI.  I would like to repeat a urine culture 2 weeks after patient finishes the antibiotics to make sure the bacteria has cleared.

## 2021-01-23 NOTE — Progress Notes (Signed)
Hi [name].  There was no fracture of your wrist. Please let me know if you have any questions.

## 2021-01-24 LAB — URINE CULTURE

## 2021-01-24 NOTE — Progress Notes (Signed)
Please let patient know that overall her lab work looks good.  Her platelets are low.  I would like her to come back for a lab appointment this week to make sure they come back up to her baseline.

## 2021-01-24 NOTE — Addendum Note (Signed)
Addended by: Larae Grooms on: 01/24/2021 07:59 AM   Modules accepted: Orders

## 2021-01-25 ENCOUNTER — Ambulatory Visit (INDEPENDENT_AMBULATORY_CARE_PROVIDER_SITE_OTHER): Payer: Medicare HMO | Admitting: Nurse Practitioner

## 2021-01-25 ENCOUNTER — Other Ambulatory Visit: Payer: Self-pay

## 2021-01-25 ENCOUNTER — Telehealth: Payer: Self-pay

## 2021-01-25 ENCOUNTER — Ambulatory Visit
Admission: RE | Admit: 2021-01-25 | Discharge: 2021-01-25 | Disposition: A | Discharge status: Home / Self Care | Payer: Medicare HMO | Attending: Nurse Practitioner | Admitting: Nurse Practitioner

## 2021-01-25 ENCOUNTER — Encounter: Payer: Self-pay | Admitting: Nurse Practitioner

## 2021-01-25 ENCOUNTER — Other Ambulatory Visit: Payer: Medicare HMO

## 2021-01-25 ENCOUNTER — Ambulatory Visit
Admission: RE | Admit: 2021-01-25 | Discharge: 2021-01-25 | Disposition: A | Discharge status: Home / Self Care | Payer: Medicare HMO | Source: Ambulatory Visit | Attending: Nurse Practitioner | Admitting: Nurse Practitioner

## 2021-01-25 VITALS — BP 175/67 | HR 86 | Temp 98.3°F

## 2021-01-25 DIAGNOSIS — M545 Low back pain, unspecified: Secondary | ICD-10-CM | POA: Insufficient documentation

## 2021-01-25 DIAGNOSIS — N3000 Acute cystitis without hematuria: Secondary | ICD-10-CM | POA: Diagnosis not present

## 2021-01-25 DIAGNOSIS — I5030 Unspecified diastolic (congestive) heart failure: Secondary | ICD-10-CM

## 2021-01-25 MED ORDER — ALBUTEROL SULFATE HFA 108 (90 BASE) MCG/ACT IN AERS: 1.0000 | INHALATION_SPRAY | Freq: Four times a day (QID) | RESPIRATORY_TRACT | 1 refills | Status: DC | PRN

## 2021-01-25 NOTE — Assessment & Plan Note (Signed)
Recommend restarting Eliquis, Metoprolol, and Spirolactone. Referral placed for Cardiology to evaluate heart function and AFIB. Also placed referral to CCM.

## 2021-01-25 NOTE — Chronic Care Management (AMB) (Signed)
  Chronic Care Management   Note  01/25/2021 Name: [name] MRN: [id number] DOB: Apr 04, 1951  [name] is a 70 y.o. year old female who is a primary care patient of Jon Billings, NP. I reached out to Kathleene Hazel by phone today in response to a referral sent by [name]'s PCP, Jon Billings, NP      [name] was given information about Chronic Care Management services today including:  CCM service includes personalized support from designated clinical staff supervised by her physician, including individualized plan of care and coordination with other care providers 24/7 contact phone numbers for assistance for urgent and routine care needs. Service will only be billed when office clinical staff spend 20 minutes or more in a month to coordinate care. Only one practitioner may furnish and bill the service in a calendar month. The patient may stop CCM services at any time (effective at the end of the month) by phone call to the office staff. The patient will be responsible for cost sharing (co-pay) of up to 20% of the service fee (after annual deductible is met).  Patient agreed to services and verbal consent obtained.   Follow up plan: Telephone appointment with care management team member scheduled for:01/27/2021  [name], [address], [place], [place] [address] Direct Dial: [phone] [name] Website: [email or web address]

## 2021-01-25 NOTE — Progress Notes (Signed)
Hi [name].  Make sure you complete the course of the antibiotics.  We will recheck the urine sample after you finish it.

## 2021-01-25 NOTE — Progress Notes (Signed)
BP (!) 175/67   Pulse 86   Temp 98.3 F (36.8 C)   SpO2 97%    Subjective:    Patient ID: [name], female    DOB: Feb 20, 1951, 70 y.o.   MRN: [id number]  HPI: [name] is a 70 y.o. female  Chief Complaint  Patient presents with   Back Pain   Medication Refill    Albuterol   Patient presents to clinic with complaints of back pain that has been ongoing since her fall a couple of weeks ago. Patient has not picked up the antibiotic prescribed for her UTI. Patient is not currently taking any medications besides her gabapentin.  Has not seen a Cardiologist.    Relevant past medical, surgical, family and social history reviewed and updated as indicated. Interim medical history since our last visit reviewed. Allergies and medications reviewed and updated.  Review of Systems  Musculoskeletal:  Positive for back pain.   Per HPI unless specifically indicated above     Objective:    BP (!) 175/67   Pulse 86   Temp 98.3 F (36.8 C)   SpO2 97%   Wt Readings from Last 3 Encounters:  01/19/21 (!) 307 lb 6.4 oz (139.4 kg)  08/24/20 298 lb (135.2 kg)  06/22/20 (!) 308 lb 8 oz (139.9 kg)    Physical Exam Vitals and nursing note reviewed.  Constitutional:      General: She is not in acute distress.    Appearance: Normal appearance. She is normal weight. She is not ill-appearing, toxic-appearing or diaphoretic.  HENT:     Head: Normocephalic.     Right Ear: External ear normal.     Left Ear: External ear normal.     Nose: Nose normal.     Mouth/Throat:     Mouth: Mucous membranes are moist.     Pharynx: Oropharynx is clear.  Eyes:     General:        Right eye: No discharge.        Left eye: No discharge.     Extraocular Movements: Extraocular movements intact.     Conjunctiva/sclera: Conjunctivae normal.     Pupils: Pupils are equal, round, and reactive to light.  Cardiovascular:     Rate and Rhythm: Normal rate and regular rhythm.     Heart sounds: No  murmur heard. Pulmonary:     Effort: Pulmonary effort is normal. No respiratory distress.     Breath sounds: Normal breath sounds. No wheezing or rales.  Musculoskeletal:     Cervical back: Normal range of motion and neck supple.  Skin:    General: Skin is warm and dry.     Capillary Refill: Capillary refill takes less than 2 seconds.  Neurological:     General: No focal deficit present.     Mental Status: She is alert and oriented to person, place, and time. Mental status is at baseline.  Psychiatric:        Mood and Affect: Mood normal.        Behavior: Behavior normal.        Thought Content: Thought content normal.        Judgment: Judgment normal.    Results for orders placed or performed in visit on 01/19/21  Urine Culture   Specimen: Urine   UR  Result Value Ref Range   Urine Culture, Routine Final report (A)    Organism ID, Bacteria Escherichia coli (A)    Antimicrobial Susceptibility  Comment   Microscopic Examination   Urine  Result Value Ref Range   WBC, UA None seen 0 - 5 /hpf   RBC None seen 0 - 2 /hpf   Epithelial Cells (non renal) 0-10 0 - 10 /hpf   Bacteria, UA Few None seen/Few  Urinalysis, Routine w reflex microscopic  Result Value Ref Range   Specific Gravity, UA 1.015 1.005 - 1.030   pH, UA 6.5 5.0 - 7.5   Color, UA Yellow Yellow   Appearance Ur Hazy (A) Clear   Leukocytes,UA Negative Negative   Protein,UA Negative Negative/Trace   Glucose, UA Negative Negative   Ketones, UA Negative Negative   RBC, UA Negative Negative   Bilirubin, UA Negative Negative   Urobilinogen, Ur 1.0 0.2 - 1.0 mg/dL   Nitrite, UA Positive (A) Negative   Microscopic Examination See below:       Assessment & Plan:   Problem List Items Addressed This Visit       Cardiovascular and Mediastinum   (HFpEF) heart failure with preserved ejection fraction (HCC) - Primary    Recommend restarting Eliquis, Metoprolol, and Spirolactone. Referral placed for Cardiology to evaluate  heart function and AFIB. Also placed referral to CCM.       Relevant Orders   Ambulatory referral to Cardiology   AMB Referral to [facility] Coordinaton   Other Visit Diagnoses     Acute cystitis without hematuria       Complete course of antibiotics. Return to clinic in 2 weeks to repeat urine sample and make sure bacteria has cleared.   Acute midline low back pain, unspecified whether sciatica present       Xray ordered to evaluate back pain. Discussed that it could be due to the UTI. Discussed importance of starting the antibiotic.    Relevant Orders   DG Lumbar Spine Complete        Follow up plan: Return if symptoms worsen or fail to improve.   A total of 30 minutes were spent on this encounter today.  When total time is documented, this includes both the face-to-face and non-face-to-face time personally spent before, during and after the visit on the date of the encounter discussing medications, referral to Cardiology and CCM referral.

## 2021-01-26 NOTE — Addendum Note (Signed)
Addended by: Larae Grooms on: 01/26/2021 09:20 AM   Modules accepted: Orders

## 2021-01-26 NOTE — Progress Notes (Signed)
Xray of the lumbar spine shows some irregularity of the T12 and L1 area.  I recommend she see Orthopedics for further evaluation.  It isn't clear on the imaging whether this is from her fall or is related to arthritis.  I have placed the referral.

## 2021-01-27 ENCOUNTER — Ambulatory Visit (INDEPENDENT_AMBULATORY_CARE_PROVIDER_SITE_OTHER): Payer: Medicare HMO | Admitting: General Practice

## 2021-01-27 ENCOUNTER — Telehealth: Payer: Medicare HMO | Admitting: General Practice

## 2021-01-27 DIAGNOSIS — F4321 Adjustment disorder with depressed mood: Secondary | ICD-10-CM

## 2021-01-27 DIAGNOSIS — I5030 Unspecified diastolic (congestive) heart failure: Secondary | ICD-10-CM | POA: Diagnosis not present

## 2021-01-27 DIAGNOSIS — R030 Elevated blood-pressure reading, without diagnosis of hypertension: Secondary | ICD-10-CM

## 2021-01-27 DIAGNOSIS — R69 Illness, unspecified: Secondary | ICD-10-CM | POA: Diagnosis not present

## 2021-01-27 DIAGNOSIS — W19XXXA Unspecified fall, initial encounter: Secondary | ICD-10-CM

## 2021-01-27 DIAGNOSIS — I4891 Unspecified atrial fibrillation: Secondary | ICD-10-CM

## 2021-01-27 DIAGNOSIS — M545 Low back pain, unspecified: Secondary | ICD-10-CM

## 2021-01-27 NOTE — Patient Instructions (Signed)
Visit Information   PATIENT GOALS:   Goals Addressed             This Visit's Progress    RNCM: Keep Low Back Pain Under Control       Timeframe:  Long-Range Goal Priority:  High Start Date:    01-27-2021                         Expected End Date:      01-27-2022                 Follow Up Date 03/24/2021    - call for medicine refill 2 or 3 days before it runs out - develop a personal pain management plan - keep track of prescription refills - plan exercise or activity when pain is best controlled - prioritize tasks for the day - stay active - track times pain is worst and when it is best - track what makes the pain worse and what makes it better - use ice or heat for pain relief - work slower and less intense when having pain    Why is this important?   Day-to-day life can be hard when you have back pain.  Pain medicine is just one piece of the treatment puzzle. There are many things you can do to manage pain and keep your back strong.   Lifestyle changes, like stopping smoking and eating foods with Vitamin D and calcium, keep your bones and muscles healthy. Your back is better when it is supported by strong muscles.  You can try these action steps to help you manage your pain.     Notes: Patient rates her pain at a 7 today on a scale of 0-10. The patient states that she only takes Tylenol for pain. Orthopedic consult placed this week from pcp.       RNCM: Manage My Emotions       Timeframe:  Long-Range Goal Priority:  High Start Date:      01-27-2021                       Expected End Date:  01-27-2022                     Follow Up Date 03/24/2021    - call and visit an old friend - join a support group - laugh; watch a funny movie or comedian - learn and use visualization or guided imagery - perform a random act of kindness - practice relaxation or meditation daily - talk about feelings with a friend, family or spiritual advisor - practice positive thinking and self-talk     Why is this important?   When you are stressed, down or upset, your body reacts too.  For example, your blood pressure may get higher; you may have a headache or stomachache.  When your emotions get the best of you, your body's ability to fight off cold and flu gets weak.  These steps will help you manage your emotions.     Notes: The patient lost her husband of 35 years almost 7 years ago (November).  The patient lost a son a year ago. She has a lot of grief and sadness. Offered LCSW but the patient declined. She knows she has this resources available. Empathetic listening and support given. Discussed the griefshare program and the patient knows this is available.       RNCM:  Manage My Medicine       Timeframe:  Short-Term Goal Priority:  High Start Date:     01-27-2021                        Expected End Date:     07-21-2021                  Follow Up Date 03/24/2021    - call for medicine refill 2 or 3 days before it runs out - call if I am sick and can't take my medicine - keep a list of all the medicines I take; vitamins and herbals too - learn to read medicine labels - use a pillbox to sort medicine - use an alarm clock or phone to remind me to take my medicine    Why is this important?   These steps will help you keep on track with your medicines.   Notes: The patient states that she does no take any medications but a few. She has stopped taking the cardiac medications as they told her she did not have AFIB. She is going to new cardiologist local in August. Education on elevated blood pressures and the need to control blood pressures. Will continue to monitor and ask for pharm D assistance for management and reconciliation of medications.       RNCM: Prevent Falls and Injury       Follow Up Date 03/24/2021    - add more outdoor lighting - always use handrails on the stairs - always wear low-heeled or flat shoes or slippers with nonskid soles - call the doctor if I am feeling  too drowsy - install bathroom grab bars - keep a flashlight by the bed - keep my cell phone with me always - learn how to get back up if I fall - make an emergency alert plan in case I fall - pick up clutter from the floors - use a nonslip pad with throw rugs, or remove them completely - use a cane or walker    Why is this important?   Most falls happen when it is hard for you to walk safely. Your balance may be off because of an illness. You may have pain in your knees, hip or other joints.  You may be overly tired or taking medicines that make you sleepy. You may not be able to see or hear clearly.  Falls can lead to broken bones, bruises or other injuries.  There are things you can do to help prevent falling.     Notes: The patient has a walker and uses a walker. The patients son lives with her. Education on calling her insurance company and asking about life alert system. The patient says she loses her balance. Last fall was 01-10-2021. Denies any injuries. Education on being safe in environment.       RNCM: Track and Manage Fluids and Swelling-Heart Failure       Timeframe:  Long-Range Goal Priority:  High Start Date:         01-27-2021                    Expected End Date: 01-27-2022                      Follow Up Date 03/24/2021    - call office if I gain more than 2 pounds in one day or 5 pounds in  one week - keep legs up while sitting - track weight in diary - use salt in moderation - watch for swelling in feet, ankles and legs every day - weigh myself daily    Why is this important?   It is important to check your weight daily and watch how much salt and liquids you have.  It will help you to manage your heart failure.    Notes: Patient does not have a scale but states she can get one. Education on the subtle changes that can happen due to heart failure. Education on monitoring weight consistently and managing sx and sx of  heart failure. Explained what EF % was and review of   her %         CLINICAL CARE PLAN: Patient Care Plan: RNCM: Management of Chronic Diseases: HF, AFIB, HTN, Chronic pain, grief and frequent falls     Problem Identified: RNCM: Management of HF, AFIB, HTN, chronic pain, grief, and frequent falls   Priority: High     Long-Range Goal: RNCM: Effective management of HT, AFIB, HTN, Chronic pain, grief, and falls prevention   Start Date: 01/27/2021  Expected End Date: 01/27/2022  This Visit's Progress: On track  Priority: High  Note:   Current Barriers:  Knowledge Deficits related to plan of care for management of Atrial Fibrillation, CHF, HTN, and Chronic pain, grief and falls  Care Coordination needs related to Level of care concerns, Medication procurement, and Mental Health Concerns  in a patient with Atrial Fibrillation, CHF, HTN, and chronic pain, grief, falls  Chronic Disease Management support and education needs related to Atrial Fibrillation, CHF, HTN, and chronic pain, grief, falls Financial Constraints.  Difficulty obtaining medications  RNCM Clinical Goal(s):  Patient will verbalize understanding of plan for management of Atrial Fibrillation, CHF, HTN, and Chronic pain, grief and falls work with RN Case Manager and Pharmacist to address needs related to Atrial Fibrillation, CHF, HTN, and chronic pain, grief and falls and Financial constraints related to inability to pay for medications , Level of care concerns, and Mental Health Concerns  take all medications exactly as prescribed and will call provider for medication related questions attend all scheduled medical appointments: cardiologist on 03-13-2021, as needed with the pcp demonstrate a decrease in Atrial Fibrillation, CHF, HTN, and Chronic pain, grief and fall exacerbations demonstrate improved adherence to prescribed treatment plan for Atrial Fibrillation, CHF, HTN, and Chronic pain, grief, and falls  demonstrate improved health management independence verbalize basic  understanding of Atrial Fibrillation, CHF, HTN, and Chronic pain, grief, and fall disease process and self health management plan demonstrate understanding of rationale for each prescribed medication work with CM team pharmacist to assist with medication cost constraints and reconciliation demonstrate ongoing self health care management ability through collaboration with RN Care manager, provider, and care team.   Interventions: 1:1 collaboration with Jon Billings, regarding development and update of comprehensive plan of care as evidenced by provider attestation and co-signature Inter-disciplinary care team collaboration (see longitudinal plan of care)   A-fib:  (Status: Goal on track: YES) Counseled on increased risk of stroke due to Afib and benefits of anticoagulation for stroke prevention; Reviewed importance of adherence to anticoagulant exactly as prescribed, the patient states she went for a cardioversion and they told her she did not need it. She has been off of  her medications. The patient states she will do what the doctor tells her to do but right now she is not taking any medications for  her heart conditions.  She further states that she can not afford the medications and would need help with options if the MD wanted her on certain medications. ; Advised patient to discuss options for medications, plan of care, possibility of wearing a hollister monitor for evaluation of heart rhythm  with provider; Counseled on avoidance of NSAIDs due to increased bleeding risk with anticoagulants; Counseled on importance of regular laboratory monitoring as prescribed; Counseled on seeking medical attention after a head injury or if there is blood in the urine/stool; Afib action plan reviewed;  Falls:  (Status: Goal on track: YES) Provided written and verbal education re: potential causes of falls and Fall prevention strategies Reviewed medications and discussed potential side effects of  medications such as dizziness and frequent urination Advised patient of importance of notifying provider of falls. Education and support given.  Assessed for signs and symptoms of orthostatic hypotension. Denies issues with orthostatic hypotension or dizziness  Assessed for falls since last encounter. Patient states last fall was 01-10-2021. The patient was using her walker but states she lost her balance and that is why she fell. The patient denies injury.  Assessed patients knowledge of fall risk prevention secondary to previously provided education Provided patient information for fall alert systems Advised patient to discuss falls, safety in the home, concerns about safety and when she has new falls  with provider Assessed social determinant of health barriers  Heart Failure Interventions:  (Status: Goal on track: YES) Basic overview and discussion of pathophysiology of Heart Failure reviewed; Provided education on low sodium diet- the patient states she does not use salt in her diet. The patient stated compliance with a heart healthy diet; Reviewed Heart Failure Action Plan in depth and provided written copy; Assessed need for readable accurate scales in home- the patient does not have a scale but states she can get on. Reviewed the importance of daily weights with the patient due to the subtle changes that can alert the patient to exacerbations with heart failure. ; Provided education about placing scale on hard, flat surface- reviewed- patient to get a scale; Advised patient to weigh each morning after emptying bladder; Discussed importance of daily weight and advised patient to weigh and record daily; Reviewed role of diuretics in prevention of fluid overload and management of heart failure; Discussed the importance of keeping all appointments with provider; Provided patient with education about the role of exercise in the management of heart failure; Advised patient to discuss heart  failure plan of care  with provider; Reviewed EF% with the patient and education provided on what the EF% was. Patients last EF% was 60-65% in January of 2022.  Hypertension: (Status: Goal on track: NO) Last practice recorded BP readings:  BP Readings from Last 3 Encounters:  01/25/21 (!) 175/67  01/19/21 (!) 147/67  08/24/20 126/80  Most recent eGFR/CrCl:  Lab Results  Component Value Date   EGFR 81 01/19/2021    No components found for: CRCL  Evaluation of current treatment plan related to hypertension self management and patient's adherence to plan as established by provider; Provided education to patient re: stroke prevention, s/s of heart attack and stroke; Reviewed medications with patient and discussed importance of compliance; Provided assistance with obtaining home blood pressure monitor via assistance from [facility] and Lavell Islam, will send an inbasket message to Lavell Islam and Janalyn Shy inquiring about the patient getting a blood pressure cuff to use so she can take her blood pressures at home and  record;  Counseled on the importance of exercise goals with target of 150 minutes per week Discussed plans with patient for ongoing care management follow up and provided patient with direct contact information for care management team; Advised patient, providing education and rationale, to monitor blood pressure daily and record, calling PCP for findings outside established parameters;  Reviewed scheduled/upcoming provider appointments including: 03-13-2021 with the cardiologist  Advised patient to discuss medications options and effective management of elevated blood pressures with provider; Provided education on prescribed diet heart healthy diet ;  Discussed complications of poorly controlled blood pressure such as heart disease, stroke, circulatory complications, vision complications, kidney impairment, sexual dysfunction;   Grief   (Status: Goal on track: YES) Evaluation of  current treatment plan related to  grief , Mental Health Concerns  self-management and patient's adherence to plan as established by provider. Discussed plans with patient for ongoing care management follow up and provided patient with direct contact information for care management team Evaluation of current treatment plan related to grief process  and patient's adherence to plan as established by provider; Advised patient to call the office for changes in mood, anxiety, or depression ; Provided education to patient re: griefshare support group locally; Reviewed medications with patient and discussed compliance, the patient is not currenttly taking anything for depression or anxiety; Provided patient with grief support  educational materials related to loss of her husband almost 7 years ago and her son a year ago, the patient has a lot of stressors in her life over grief and loss ; Social Work referral for assistance with grief, but the patient declined services at this time. The patient knows the LCSW is available to assist as needed for support and education; Discussed plans with patient for ongoing care management follow up and provided patient with direct contact information for care management team; Advised patient to discuss grief and stress  with provider; Screening for signs and symptoms of depression related to chronic disease state;  Assessed social determinant of health barriers;   Pain:  (Status: Goal on track: YES) Pain assessment performed, patient rates chronic back pain at a 7 today on a scale of 0-10 Medications reviewed. The patient only takes Tylenol for pain relief  Reviewed provider established plan for pain management; Discussed importance of adherence to all scheduled medical appointments; Counseled on the importance of reporting any/all new or changed pain symptoms or management strategies to pain management provider; Advised patient to report to care team affect of pain on  daily activities; Discussed use of relaxation techniques and/or diversional activities to assist with pain reduction (distraction, imagery, relaxation, massage, acupressure, TENS, heat, and cold application; Reviewed with patient prescribed pharmacological and nonpharmacological pain relief strategies; Advised patient to discuss pain relief options  with provider;  Patient Goals/Self-Care Activities: Patient will self administer medications as prescribed Patient will attend all scheduled provider appointments Patient will call pharmacy for medication refills Patient will attend church or other social activities Patient will continue to perform ADL's independently Patient will continue to perform IADL's independently Patient will call provider office for new concerns or questions Patient will work with BSW to address care coordination needs and will continue to work with the clinical team to address health care and disease management related needs.    Follow Up Plan: Telephone follow up appointment with care management team member scheduled for:  03-24-2021 at 0900 am    Consent to CCM Services: Ms. Konczal was given information about Chronic Care Management  services today including:  CCM service includes personalized support from designated clinical staff supervised by her physician, including individualized plan of care and coordination with other care providers 24/7 contact phone numbers for assistance for urgent and routine care needs. Service will only be billed when office clinical staff spend 20 minutes or more in a month to coordinate care. Only one practitioner may furnish and bill the service in a calendar month. The patient may stop CCM services at any time (effective at the end of the month) by phone call to the office staff. The patient will be responsible for cost sharing (co-pay) of up to 20% of the service fee (after annual deductible is met).  Patient agreed to services and  verbal consent obtained.   Patient verbalizes understanding of instructions provided today and agrees to view in [place].   Telephone follow up appointment with care management team member scheduled for:03-24-2021 at 0900 am  [place], MSN, [facility] Mobile: [phone] Managing Loss, Adult People experience loss in many different ways throughout their lives. Events such as moving, changing jobs, and losing friends can create a sense of loss. The loss may be as serious as a major health change, divorce, death of a pet, or death of a loved one. All of these types of loss are likely to create a physical and emotional reaction known as grief. Grief is the result of a major change or an absence of something or someone that you count on. Grief is anormal reaction to loss. A variety of factors can affect your grieving experience, including: The nature of your loss. Your relationship to what or whom you lost. Your understanding of grief and how to manage it. Your support system. How to manage lifestyle changes Keep to your normal routine as much as possible. If you have trouble focusing or doing normal activities, it is acceptable to take some time away from your normal routine. Spend time with friends and loved ones. Eat a healthy diet, get plenty of sleep, and rest when you feel tired. How to recognize changes  The way that you deal with your grief will affect your ability to function as you normally do. When grieving, you may experience these changes: Numbness, shock, sadness, anxiety, anger, denial, and guilt. Thoughts about death. Unexpected crying. A physical sensation of emptiness in your stomach. Problems sleeping and eating. Tiredness (fatigue). Loss of interest in normal activities. Dreaming about or imagining seeing the person who died. A need to remember what or whom you lost. Difficulty  thinking about anything other than your loss for a period of time. Relief. If you have been expecting the loss for a while, you may feel a sense of relief when it happens. Follow these instructions at home: Activity Express your feelings in healthy ways, such as: Talking with others about your loss. It may be helpful to find others who have had a similar loss, such as a support group. Writing down your feelings in a journal. Doing physical activities to release stress and emotional energy. Doing creative activities like painting, sculpting, or playing or listening to music. Practicing resilience. This is the ability to recover and adjust after facing challenges. Reading some resources that encourage resilience may help you to learn ways to practice those behaviors.  General instructions Be patient with yourself and others. Allow the grieving process to happen, and remember that grieving takes time. It is likely that you  may never feel completely done with some grief. You may find a way to move on while still cherishing memories and feelings about your loss. Accepting your loss is a process. It can take months or longer to adjust. Keep all follow-up visits as told by your health care provider. This is important. Where to find support To get support for managing loss: Ask your health care provider for help and recommendations, such as grief counseling or therapy. Think about joining a support group for people who are managing a loss. Where to find more information You can find more information about managing loss from: American Society of Clinical Oncology: www.cancer.net American Psychological Association: TVStereos.ch Contact a health care provider if: Your grief is extreme and keeps getting worse. You have ongoing grief that does not improve. Your body shows symptoms of grief, such as illness. You feel depressed, anxious, or lonely. Get help right away if: You have thoughts about  hurting yourself or others. If you ever feel like you may hurt yourself or others, or have thoughts about taking your own life, get help right away. You can go to your nearest emergency department or call: Your local emergency services (911 in the [place] A suicide crisis helpline, such as the [facility] at 580-015-3305. This is open 24 hours a day. Summary Grief is the result of a major change or an absence of someone or something that you count on. Grief is a normal reaction to loss. The depth of grief and the period of recovery depend on the type of loss and your ability to adjust to the change and process your feelings. Processing grief requires patience and a willingness to accept your feelings and talk about your loss with people who are supportive. It is important to find resources that work for you and to realize that people experience grief differently. There is not one grieving process that works for everyone in the same way. Be aware that when grief becomes extreme, it can lead to more severe issues like isolation, depression, anxiety, or suicidal thoughts. Talk with your health care provider if you have any of these issues. This information is not intended to replace advice given to you by your health care provider. Make sure you discuss any questions you have with your healthcare provider. Document Revised: 12/31/2019 Document Reviewed: 12/31/2019 Elsevier Patient Education  [place]. [email or web address]">  DASH Eating Plan DASH stands for Dietary Approaches to Stop Hypertension. The DASH eating plan is a healthy eating plan that has been shown to: Reduce high blood pressure (hypertension). Reduce your risk for type 2 diabetes, heart disease, and stroke. Help with weight loss. What are tips for following this plan? Reading food labels Check food labels for the amount of salt (sodium) per serving.  Choose foods with less than 5 percent of the Daily Value of sodium. Generally, foods with less than 300 milligrams (mg) of sodium per serving fit into this eating plan. To find whole grains, look for the word "whole" as the first word in the ingredient list. Shopping Buy products labeled as "low-sodium" or "no salt added." Buy fresh foods. Avoid canned foods and pre-made or frozen meals. Cooking Avoid adding salt when cooking. Use salt-free seasonings or herbs instead of table salt or sea salt. Check with your health care provider or pharmacist before using salt substitutes. Do not fry foods. Cook foods using healthy methods such as baking, boiling, grilling, roasting, and broiling instead. Cook with heart-healthy oils, such as  olive, canola, avocado, soybean, or sunflower oil. Meal planning  Eat a balanced diet that includes: 4 or more servings of fruits and 4 or more servings of vegetables each day. Try to fill one-half of your plate with fruits and vegetables. 6-8 servings of whole grains each day. Less than 6 oz (170 g) of lean meat, poultry, or fish each day. A 3-oz (85-g) serving of meat is about the same size as a deck of cards. One egg equals 1 oz (28 g). 2-3 servings of low-fat dairy each day. One serving is 1 cup (237 mL). 1 serving of nuts, seeds, or beans 5 times each week. 2-3 servings of heart-healthy fats. Healthy fats called omega-3 fatty acids are found in foods such as walnuts, flaxseeds, fortified milks, and eggs. These fats are also found in cold-water fish, such as sardines, salmon, and mackerel. Limit how much you eat of: Canned or prepackaged foods. Food that is high in trans fat, such as some fried foods. Food that is high in saturated fat, such as fatty meat. Desserts and other sweets, sugary drinks, and other foods with added sugar. Full-fat dairy products. Do not salt foods before eating. Do not eat more than 4 egg yolks a week. Try to eat at least 2 vegetarian  meals a week. Eat more home-cooked food and less restaurant, buffet, and fast food.  Lifestyle When eating at a restaurant, ask that your food be prepared with less salt or no salt, if possible. If you drink alcohol: Limit how much you use to: 0-1 drink a day for women who are not pregnant. 0-2 drinks a day for men. Be aware of how much alcohol is in your drink. In the [place] one drink equals one 12 oz bottle of beer (355 mL), one 5 oz glass of wine (148 mL), or one 1 oz glass of hard liquor (44 mL). General information Avoid eating more than 2,300 mg of salt a day. If you have hypertension, you may need to reduce your sodium intake to 1,500 mg a day. Work with your health care provider to maintain a healthy body weight or to lose weight. Ask what an ideal weight is for you. Get at least 30 minutes of exercise that causes your heart to beat faster (aerobic exercise) most days of the week. Activities may include walking, swimming, or biking. Work with your health care provider or dietitian to adjust your eating plan to your individual calorie needs. What foods should I eat? Fruits All fresh, dried, or frozen fruit. Canned fruit in natural juice (without addedsugar). Vegetables Fresh or frozen vegetables (raw, steamed, roasted, or grilled). Low-sodium or reduced-sodium tomato and vegetable juice. Low-sodium or reduced-sodium tomatosauce and tomato paste. Low-sodium or reduced-sodium canned vegetables. Grains Whole-grain or whole-wheat bread. Whole-grain or whole-wheat pasta. [place] rice. [place]. Bulgur. Whole-grain and low-sodium cereals. Pita bread.Low-fat, low-sodium crackers. Whole-wheat flour tortillas. Meats and other proteins Skinless chicken or [place]. Ground chicken or [place]. Pork with fat trimmed off. Fish and seafood. Egg whites. Dried beans, peas, or lentils. Unsalted nuts, nut butters, and seeds. Unsalted canned beans. Lean cuts of beef with fat trimmed off. Low-sodium, lean  precooked or cured meat, such as sausages or meatloaves. Dairy Low-fat (1%) or fat-free (skim) milk. Reduced-fat, low-fat, or fat-free cheeses. Nonfat, low-sodium ricotta or cottage cheese. Low-fat or nonfatyogurt. Low-fat, low-sodium cheese. Fats and oils Soft margarine without trans fats. Vegetable oil. Reduced-fat, low-fat, or light mayonnaise and salad dressings (reduced-sodium). Canola, safflower, olive, avocado, soybean, andsunflower  oils. Avocado. Seasonings and condiments Herbs. Spices. Seasoning mixes without salt. Other foods Unsalted popcorn and pretzels. Fat-free sweets. The items listed above may not be a complete list of foods and beverages you can eat. Contact a dietitian for more information. What foods should I avoid? Fruits Canned fruit in a light or heavy syrup. Fried fruit. Fruit in cream or buttersauce. Vegetables Creamed or fried vegetables. Vegetables in a cheese sauce. Regular canned vegetables (not low-sodium or reduced-sodium). Regular canned tomato sauce and paste (not low-sodium or reduced-sodium). Regular tomato and vegetable juice(not low-sodium or reduced-sodium). Angie Fava. Olives. Grains Baked goods made with fat, such as croissants, muffins, or some breads. Drypasta or rice meal packs. Meats and other proteins Fatty cuts of meat. Ribs. Fried meat. Berniece Salines. Bologna, salami, and other precooked or cured meats, such as sausages or meat loaves. Fat from the back of a pig (fatback). Bratwurst. Salted nuts and seeds. Canned beans with added salt. Canned orsmoked fish. Whole eggs or egg yolks. Chicken or [place] with skin. Dairy Whole or 2% milk, cream, and half-and-half. Whole or full-fat cream cheese. Whole-fat or sweetened yogurt. Full-fat cheese. Nondairy creamers. Whippedtoppings. Processed cheese and cheese spreads. Fats and oils Butter. Stick margarine. Lard. Shortening. Ghee. Bacon fat. Tropical oils, suchas coconut, palm kernel, or palm oil. Seasonings and  condiments Onion salt, garlic salt, seasoned salt, table salt, and sea salt. Worcestershire sauce. Tartar sauce. Barbecue sauce. Teriyaki sauce. Soy sauce, including reduced-sodium. Steak sauce. Canned and packaged gravies. Fish sauce. Oyster sauce. Cocktail sauce. Store-bought horseradish. Ketchup. Mustard. Meat flavorings and tenderizers. Bouillon cubes. Hot sauces. Pre-made or packaged marinades. Pre-made or packaged taco seasonings. Relishes. Regular saladdressings. Other foods Salted popcorn and pretzels. The items listed above may not be a complete list of foods and beverages you should avoid. Contact a dietitian for more information. Where to find more information National Heart, Lung, and Blood Institute: https://wilson-eaton.com/ American Heart Association: www.heart.org Academy of Nutrition and Dietetics: www.eatright.[place]: www.kidney.org Summary The DASH eating plan is a healthy eating plan that has been shown to reduce high blood pressure (hypertension). It may also reduce your risk for type 2 diabetes, heart disease, and stroke. When on the DASH eating plan, aim to eat more fresh fruits and vegetables, whole grains, lean proteins, low-fat dairy, and heart-healthy fats. With the DASH eating plan, you should limit salt (sodium) intake to 2,300 mg a day. If you have hypertension, you may need to reduce your sodium intake to 1,500 mg a day. Work with your health care provider or dietitian to adjust your eating plan to your individual calorie needs. This information is not intended to replace advice given to you by your health care provider. Make sure you discuss any questions you have with your healthcare provider. Document Revised: 06/12/2019 Document Reviewed: 06/12/2019 Elsevier Patient Education  2022 [employer]. It is important to avoid accidents which may result in broken bones.  Here are a few ideas on how to make your home safer so you will be less likely to  trip or fall.  Use nonskid mats or non slip strips in your shower or tub, on your bathroom floor and around sinks.  If you know that you have spilled water, wipe it up! In the bathroom, it is important to have properly installed grab bars on the walls or on the edge of the tub.  Towel racks are NOT strong enough for you to hold onto or to pull on for support. Stairs and hallways  should have enough light.  Add lamps or night lights if you need ore light. It is good to have handrails on both sides of the stairs if possible.  Always fix broken handrails right away. It is important to see the edges of steps.  Paint the edges of outdoor steps white so you can see them better.  Put colored tape on the edge of inside steps. Throw-rugs are dangerous because they can slide.  Removing the rugs is the best idea, but if they must stay, add adhesive carpet tape to prevent slipping. Do not keep things on stairs or in the halls.  Remove small furniture that blocks the halls as it may cause you to trip.  Keep telephone and electrical cords out of the way where you walk. Always were sturdy, rubber-soled shoes for good support.  Never wear just socks, especially on the stairs.  Socks may cause you to slip or fall.  Do not wear full-length housecoats as you can easily trip on the bottom.  Place the things you use the most on the shelves that are the easiest to reach.  If you use a stepstool, make sure it is in good condition.  If you feel unsteady, DO NOT climb, ask for help. If a health professional advises you to use a cane or walker, do not be ashamed.  These items can keep you from falling and breaking your bones.

## 2021-01-27 NOTE — Chronic Care Management (AMB) (Signed)
Chronic Care Management   CCM RN Visit Note  01/27/2021 Name: [name] MRN: [id number] DOB: October 03, 1950  Subjective: [name] is a 70 y.o. year old female who is a primary care patient of Jon Billings, NP. The care management team was consulted for assistance with disease management and care coordination needs.    Engaged with patient by telephone for initial visit in response to provider referral for case management and/or care coordination services.   Consent to Services:  The patient was given the following information about Chronic Care Management services today, agreed to services, and gave verbal consent: 1. CCM service includes personalized support from designated clinical staff supervised by the primary care provider, including individualized plan of care and coordination with other care providers 2. 24/7 contact phone numbers for assistance for urgent and routine care needs. 3. Service will only be billed when office clinical staff spend 20 minutes or more in a month to coordinate care. 4. Only one practitioner may furnish and bill the service in a calendar month. 5.The patient may stop CCM services at any time (effective at the end of the month) by phone call to the office staff. 6. The patient will be responsible for cost sharing (co-pay) of up to 20% of the service fee (after annual deductible is met). Patient agreed to services and consent obtained.  Patient agreed to services and verbal consent obtained.   Assessment: Review of patient past medical history, allergies, medications, health status, including review of consultants reports, laboratory and other test data, was performed as part of comprehensive evaluation and provision of chronic care management services.   SDOH (Social Determinants of Health) assessments and interventions performed:  SDOH Interventions    Flowsheet Row Most Recent Value  SDOH Interventions   Financial Strain Interventions Other  (Comment)  [resources for pharmacy]  Stress Interventions Other (Comment)  [patient walks and has a good support system]  Social Connections Interventions Other (Comment)  [has children nearby, son lives with her, grief over son that died a year ago, lost husband almost 7 years ago (Nov)- feels she has good support]        CCM Care Plan  No Known Allergies  Outpatient Encounter Medications as of 01/27/2021  Medication Sig   Acetaminophen (TYLENOL 8 HOUR PO) Take by mouth as needed.   albuterol (VENTOLIN HFA) 108 (90 Base) MCG/ACT inhaler Inhale 1-2 puffs into the lungs every 6 (six) hours as needed for wheezing or shortness of breath.   gabapentin (NEURONTIN) 600 MG tablet TAKE 1 TABLET (600 MG TOTAL) BY MOUTH 3 (THREE) TIMES DAILY AS NEEDED.   Spacer/Aero-Holding Chambers ([place]) MISC by Does not apply route.   apixaban (ELIQUIS) 5 MG TABS tablet Take by mouth. (Patient not taking: No sig reported)   hydrOXYzine (ATARAX/VISTARIL) 25 MG tablet Take 1 tablet (25 mg total) by mouth 3 (three) times daily as needed for anxiety or itching. (Patient not taking: No sig reported)   metoprolol succinate (TOPROL-XL) 50 MG 24 hr tablet Take by mouth. (Patient not taking: No sig reported)   ondansetron (ZOFRAN) 4 MG tablet Take 1 tablet (4 mg total) by mouth every 8 (eight) hours as needed for nausea or vomiting. (Patient not taking: No sig reported)   spironolactone (ALDACTONE) 25 MG tablet Take by mouth. (Patient not taking: No sig reported)   venlafaxine XR (EFFEXOR-XR) 75 MG 24 hr capsule TAKE 1 CAPSULE BY MOUTH DAILY WITH BREAKFAST. (Patient not taking: No sig reported)  No facility-administered encounter medications on file as of 01/27/2021.    Patient Active Problem List   Diagnosis Date Noted   Atrial fibrillation ([place]) 08/24/2020   Pneumonia due to COVID-19 virus 08/24/2020   (HFpEF) heart failure with preserved ejection fraction ([place]) 08/24/2020   Viral URI 08/03/2020    Postherpetic neuralgia 03/29/2020   Grief 03/29/2020   Cough 03/29/2020   Encounter for screening colonoscopy    Elevated blood pressure reading 01/19/2020   Osteoarthritis of right knee 12/21/2019   Morbid obesity due to excess calories ([place]) 08/13/2015   Chronic pain of right knee 08/13/2015    Conditions to be addressed/monitored:Atrial Fibrillation, CHF, HTN, and Chronic pain, grief and fall prevention   Care Plan : RNCM: Management of Chronic Diseases: HF, AFIB, HTN, Chronic pain, grief and frequent falls  Updates made by Vanita Ingles since 01/27/2021 12:00 AM     Problem: RNCM: Management of HF, AFIB, HTN, chronic pain, grief, and frequent falls   Priority: High     Long-Range Goal: RNCM: Effective management of HT, AFIB, HTN, Chronic pain, grief, and falls prevention   Start Date: 01/27/2021  Expected End Date: 01/27/2022  This Visit's Progress: On track  Priority: High  Note:   Current Barriers:  Knowledge Deficits related to plan of care for management of Atrial Fibrillation, CHF, HTN, and Chronic pain, grief and falls  Care Coordination needs related to Level of care concerns, Medication procurement, and Mental Health Concerns  in a patient with Atrial Fibrillation, CHF, HTN, and chronic pain, grief, falls  Chronic Disease Management support and education needs related to Atrial Fibrillation, CHF, HTN, and chronic pain, grief, falls Financial Constraints.  Difficulty obtaining medications  RNCM Clinical Goal(s):  Patient will verbalize understanding of plan for management of Atrial Fibrillation, CHF, HTN, and Chronic pain, grief and falls work with RN Case Manager and Pharmacist to address needs related to Atrial Fibrillation, CHF, HTN, and chronic pain, grief and falls and Financial constraints related to inability to pay for medications , Level of care concerns, and Mental Health Concerns  take all medications exactly as prescribed and will call provider for medication  related questions attend all scheduled medical appointments: cardiologist on 03-13-2021, as needed with the pcp demonstrate a decrease in Atrial Fibrillation, CHF, HTN, and Chronic pain, grief and fall exacerbations demonstrate improved adherence to prescribed treatment plan for Atrial Fibrillation, CHF, HTN, and Chronic pain, grief, and falls  demonstrate improved health management independence verbalize basic understanding of Atrial Fibrillation, CHF, HTN, and Chronic pain, grief, and fall disease process and self health management plan demonstrate understanding of rationale for each prescribed medication work with CM team pharmacist to assist with medication cost constraints and reconciliation demonstrate ongoing self health care management ability through collaboration with RN Care manager, provider, and care team.   Interventions: 1:1 collaboration with Jon Billings, regarding development and update of comprehensive plan of care as evidenced by provider attestation and co-signature Inter-disciplinary care team collaboration (see longitudinal plan of care)   A-fib:  (Status: Goal on track: YES) Counseled on increased risk of stroke due to Afib and benefits of anticoagulation for stroke prevention; Reviewed importance of adherence to anticoagulant exactly as prescribed, the patient states she went for a cardioversion and they told her she did not need it. She has been off of  her medications. The patient states she will do what the doctor tells her to do but right now she is not taking any medications for her  heart conditions.  She further states that she can not afford the medications and would need help with options if the MD wanted her on certain medications. ; Advised patient to discuss options for medications, plan of care, possibility of wearing a hollister monitor for evaluation of heart rhythm  with provider; Counseled on avoidance of NSAIDs due to increased bleeding risk with  anticoagulants; Counseled on importance of regular laboratory monitoring as prescribed; Counseled on seeking medical attention after a head injury or if there is blood in the urine/stool; Afib action plan reviewed;  Falls:  (Status: Goal on track: YES) Provided written and verbal education re: potential causes of falls and Fall prevention strategies Reviewed medications and discussed potential side effects of medications such as dizziness and frequent urination Advised patient of importance of notifying provider of falls. Education and support given.  Assessed for signs and symptoms of orthostatic hypotension. Denies issues with orthostatic hypotension or dizziness  Assessed for falls since last encounter. Patient states last fall was 01-10-2021. The patient was using her walker but states she lost her balance and that is why she fell. The patient denies injury.  Assessed patients knowledge of fall risk prevention secondary to previously provided education Provided patient information for fall alert systems Advised patient to discuss falls, safety in the home, concerns about safety and when she has new falls  with provider Assessed social determinant of health barriers  Heart Failure Interventions:  (Status: Goal on track: YES) Basic overview and discussion of pathophysiology of Heart Failure reviewed; Provided education on low sodium diet- the patient states she does not use salt in her diet. The patient stated compliance with a heart healthy diet; Reviewed Heart Failure Action Plan in depth and provided written copy; Assessed need for readable accurate scales in home- the patient does not have a scale but states she can get on. Reviewed the importance of daily weights with the patient due to the subtle changes that can alert the patient to exacerbations with heart failure. ; Provided education about placing scale on hard, flat surface- reviewed- patient to get a scale; Advised patient to weigh  each morning after emptying bladder; Discussed importance of daily weight and advised patient to weigh and record daily; Reviewed role of diuretics in prevention of fluid overload and management of heart failure; Discussed the importance of keeping all appointments with provider; Provided patient with education about the role of exercise in the management of heart failure; Advised patient to discuss heart failure plan of care  with provider; Reviewed EF% with the patient and education provided on what the EF% was. Patients last EF% was 60-65% in January of 2022.  Hypertension: (Status: Goal on track: NO) Last practice recorded BP readings:  BP Readings from Last 3 Encounters:  01/25/21 (!) 175/67  01/19/21 (!) 147/67  08/24/20 126/80  Most recent eGFR/CrCl:  Lab Results  Component Value Date   EGFR 81 01/19/2021    No components found for: CRCL  Evaluation of current treatment plan related to hypertension self management and patient's adherence to plan as established by provider; Provided education to patient re: stroke prevention, s/s of heart attack and stroke; Reviewed medications with patient and discussed importance of compliance; Provided assistance with obtaining home blood pressure monitor via assistance from [facility] and Lavell Islam, will send an inbasket message to Lavell Islam and Janalyn Shy inquiring about the patient getting a blood pressure cuff to use so she can take her blood pressures at home and record;  Counseled on the importance of exercise goals with target of 150 minutes per week Discussed plans with patient for ongoing care management follow up and provided patient with direct contact information for care management team; Advised patient, providing education and rationale, to monitor blood pressure daily and record, calling PCP for findings outside established parameters;  Reviewed scheduled/upcoming provider appointments including: 03-13-2021 with the cardiologist   Advised patient to discuss medications options and effective management of elevated blood pressures with provider; Provided education on prescribed diet heart healthy diet ;  Discussed complications of poorly controlled blood pressure such as heart disease, stroke, circulatory complications, vision complications, kidney impairment, sexual dysfunction;   Grief   (Status: Goal on track: YES) Evaluation of current treatment plan related to  grief , Mental Health Concerns  self-management and patient's adherence to plan as established by provider. Discussed plans with patient for ongoing care management follow up and provided patient with direct contact information for care management team Evaluation of current treatment plan related to grief process  and patient's adherence to plan as established by provider; Advised patient to call the office for changes in mood, anxiety, or depression ; Provided education to patient re: griefshare support group locally; Reviewed medications with patient and discussed compliance, the patient is not currenttly taking anything for depression or anxiety; Provided patient with grief support  educational materials related to loss of her husband almost 7 years ago and her son a year ago, the patient has a lot of stressors in her life over grief and loss ; Social Work referral for assistance with grief, but the patient declined services at this time. The patient knows the LCSW is available to assist as needed for support and education; Discussed plans with patient for ongoing care management follow up and provided patient with direct contact information for care management team; Advised patient to discuss grief and stress  with provider; Screening for signs and symptoms of depression related to chronic disease state;  Assessed social determinant of health barriers;   Pain:  (Status: Goal on track: YES) Pain assessment performed, patient rates chronic back pain at a 7  today on a scale of 0-10 Medications reviewed. The patient only takes Tylenol for pain relief  Reviewed provider established plan for pain management; Discussed importance of adherence to all scheduled medical appointments; Counseled on the importance of reporting any/all new or changed pain symptoms or management strategies to pain management provider; Advised patient to report to care team affect of pain on daily activities; Discussed use of relaxation techniques and/or diversional activities to assist with pain reduction (distraction, imagery, relaxation, massage, acupressure, TENS, heat, and cold application; Reviewed with patient prescribed pharmacological and nonpharmacological pain relief strategies; Advised patient to discuss pain relief options  with provider;  Patient Goals/Self-Care Activities: Patient will self administer medications as prescribed Patient will attend all scheduled provider appointments Patient will call pharmacy for medication refills Patient will attend church or other social activities Patient will continue to perform ADL's independently Patient will continue to perform IADL's independently Patient will call provider office for new concerns or questions Patient will work with BSW to address care coordination needs and will continue to work with the clinical team to address health care and disease management related needs.    Follow Up Plan: Telephone follow up appointment with care management team member scheduled for:  03-24-2021 at 0900 am     Plan:Telephone follow up appointment with care management team member scheduled for:  03-24-2021 at  0900 am  Noreene Larsson RN, MSN, [facility] Mobile: [phone]

## 2021-01-30 ENCOUNTER — Telehealth: Payer: Self-pay | Admitting: Nurse Practitioner

## 2021-01-30 NOTE — Telephone Encounter (Signed)
   Telephone encounter was:  Unsuccessful.  01/30/2021 Name: [name] MRN: [id number] DOB: [birth year]  Unsuccessful outbound call made today to assist with:  Financial Difficulties related to financial stress  Outreach Attempt:  1st Attempt  A HIPAA compliant voice message was left requesting a return call.  Instructed patient to call back at [phone].  April Green Care Guide, Embedded Care Coordination [facility], Care Management Phone: [phone] Email: [email or web address]

## 2021-02-08 ENCOUNTER — Other Ambulatory Visit: Payer: Medicare HMO

## 2021-02-08 ENCOUNTER — Other Ambulatory Visit: Payer: Self-pay

## 2021-02-08 DIAGNOSIS — N3000 Acute cystitis without hematuria: Secondary | ICD-10-CM | POA: Diagnosis not present

## 2021-02-09 LAB — CBC WITH DIFFERENTIAL/PLATELET
Basophils Absolute: 0 10*3/uL (ref 0.0–0.2)
Basos: 1 %
EOS (ABSOLUTE): 0.2 10*3/uL (ref 0.0–0.4)
Eos: 5 %
Hematocrit: 38.1 % (ref 34.0–46.6)
Hemoglobin: 12.9 g/dL (ref 11.1–15.9)
Immature Grans (Abs): 0 10*3/uL (ref 0.0–0.1)
Immature Granulocytes: 0 %
Lymphocytes Absolute: 0.9 10*3/uL (ref 0.7–3.1)
Lymphs: 24 %
MCH: 31.6 pg (ref 26.6–33.0)
MCHC: 33.9 g/dL (ref 31.5–35.7)
MCV: 93 fL (ref 79–97)
Monocytes Absolute: 0.4 10*3/uL (ref 0.1–0.9)
Monocytes: 10 %
Neutrophils Absolute: 2.3 10*3/uL (ref 1.4–7.0)
Neutrophils: 60 %
Platelets: 170 10*3/uL (ref 150–450)
RBC: 4.08 x10E6/uL (ref 3.77–5.28)
RDW: 11.9 % (ref 11.7–15.4)
WBC: 3.9 10*3/uL (ref 3.4–10.8)

## 2021-02-09 NOTE — Progress Notes (Signed)
Hi Idil. Your labs look good. We will continue to monitor in the future.

## 2021-02-10 LAB — URINE CULTURE

## 2021-02-10 NOTE — Progress Notes (Signed)
Hi [name]. Your urine didn't grow any bacteria. Please let me know if you have any questions.

## 2021-02-20 DIAGNOSIS — Z20822 Contact with and (suspected) exposure to covid-19: Secondary | ICD-10-CM | POA: Diagnosis not present

## 2021-03-13 ENCOUNTER — Ambulatory Visit: Payer: Medicare HMO | Admitting: Cardiology

## 2021-03-14 ENCOUNTER — Encounter: Payer: Self-pay | Admitting: Cardiology

## 2021-03-22 ENCOUNTER — Telehealth: Payer: Self-pay

## 2021-03-22 NOTE — Telephone Encounter (Signed)
  Care Management   Follow Up Note   03/22/2021 Name: NEA GITTENS MRN: [id number] DOB: 01-12-1951   Referred by: Larae Grooms, NP Reason for referral : Chronic Care Management (RNCM: Follow up for Chronic Disease Management and Care Coordination Needs)   An unsuccessful telephone outreach was attempted today. The patient was referred to the case management team for assistance with care management and care coordination.   Follow Up Plan: Telephone follow up appointment with care management team member scheduled for: 03-24-2021 at 0900 am  Alto Denver RN, MSN, CCM Community Care Coordinator [place]  [facility] Mobile: [phone]

## 2021-03-23 ENCOUNTER — Telehealth: Payer: Self-pay | Admitting: *Deleted

## 2021-03-23 DIAGNOSIS — Z20822 Contact with and (suspected) exposure to covid-19: Secondary | ICD-10-CM | POA: Diagnosis not present

## 2021-03-23 NOTE — Telephone Encounter (Signed)
   Telephone encounter was:  Unsuccessful.  03/23/2021 Name: SHALLYN CONSTANCIO MRN: [id number] DOB: Jun 25, 1951  Unsuccessful outbound call made today to assist with:   financial strain.  Outreach Attempt:  2nd Attempt  A HIPAA compliant voice message was left requesting a return call.  Instructed patient to call back at [phone].  April Green Care Guide, Embedded Care Coordination [facility], Care Management Phone: [phone] Email: [email or web address]

## 2021-03-24 ENCOUNTER — Telehealth: Payer: Self-pay

## 2021-03-24 DIAGNOSIS — Z20822 Contact with and (suspected) exposure to covid-19: Secondary | ICD-10-CM | POA: Diagnosis not present

## 2021-03-24 NOTE — Telephone Encounter (Signed)
  Care Management   Follow Up Note   03/24/2021 Name: [name] MRN: [id number] DOB: 29-Jul-1950   Referred by: Larae Grooms, NP Reason for referral : Chronic Care Management (RNCM: Follow up for Chronic Disease Management and Care Coordination Needs )   An unsuccessful telephone outreach was attempted today. The patient was referred to the case management team for assistance with care management and care coordination.   Follow Up Plan: A HIPPA compliant phone message was left for the patient providing contact information and requesting a return call.   Alto Denver RN, MSN, CCM Community Care Coordinator [place]  [facility] Mobile: [phone]

## 2021-03-25 DIAGNOSIS — Z20822 Contact with and (suspected) exposure to covid-19: Secondary | ICD-10-CM | POA: Diagnosis not present

## 2021-04-03 NOTE — Telephone Encounter (Signed)
Pt has been rescheduled. 

## 2021-04-10 ENCOUNTER — Ambulatory Visit (INDEPENDENT_AMBULATORY_CARE_PROVIDER_SITE_OTHER): Payer: Medicare HMO

## 2021-04-10 ENCOUNTER — Other Ambulatory Visit: Payer: Self-pay | Admitting: Nurse Practitioner

## 2021-04-10 VITALS — Ht 65.0 in | Wt 300.0 lb

## 2021-04-10 DIAGNOSIS — Z0000 Encounter for general adult medical examination without abnormal findings: Secondary | ICD-10-CM

## 2021-04-10 MED ORDER — GABAPENTIN 600 MG PO TABS: 600.0000 mg | ORAL_TABLET | Freq: Three times a day (TID) | ORAL | 2 refills | Status: DC | PRN

## 2021-04-10 NOTE — Telephone Encounter (Signed)
Medication: gabapentin (NEURONTIN) 600 MG tablet [[id number]]   Has the patient contacted their pharmacy? YES - Advised to call the doctors office (Agent: If no, request that the patient contact the pharmacy for the refill.) (Agent: If yes, when and what did the pharmacy advise?)  Preferred Pharmacy (with phone number or street name): [facility] [address] [place], [place]. [address] Has the patient been seen for an appointment in the last year OR does the patient have an upcoming appointment? YES 01/30/21  Agent: Please be advised that RX refills may take up to 3 business days. We ask that you follow-up with your pharmacy.

## 2021-04-10 NOTE — Telephone Encounter (Signed)
Requested medications are due for refill today.  yes  Requested medications are on the active medications list. yes  Last refill 05/23/2020  Future visit scheduled.   yes  Notes to clinic.  Rx signed by Cathlean Marseilles

## 2021-04-10 NOTE — Progress Notes (Signed)
I connected with [name] today by telephone and verified that I am speaking with the correct person using two identifiers. Location patient: home Location provider: work Persons participating in the virtual visit: Casandra Dallaire, Elisha Ponder LPN.   I discussed the limitations, risks, security and privacy concerns of performing an evaluation and management service by telephone and the availability of in person appointments. I also discussed with the patient that there may be a patient responsible charge related to this service. The patient expressed understanding and verbally consented to this telephonic visit.    Interactive audio and video telecommunications were attempted between this provider and patient, however failed, due to patient having technical difficulties OR patient did not have access to video capability.  We continued and completed visit with audio only.     Vital signs may be patient reported or missing.  Subjective:   [name] is a 70 y.o. female who presents for Medicare Annual (Subsequent) preventive examination.  Review of Systems     Cardiac Risk Factors include: advanced age (>63men, >23 women);obesity (BMI >30kg/m2);sedentary lifestyle     Objective:    Today's Vitals   04/10/21 0856  Weight: 300 lb (136.1 kg)  Height: 5\' 5"  (1.651 m)   Body mass index is 49.92 kg/m.  Advanced Directives 04/10/2021 04/08/2020 02/19/2020 12/13/2019 12/19/2016 09/01/2015  Does Patient Have a Medical Advance Directive? No No No No No No  Would patient like information on creating a medical advance directive? - - No - Patient declined - - No - patient declined information    Current Medications (verified) Outpatient Encounter Medications as of 04/10/2021  Medication Sig   Acetaminophen (TYLENOL 8 HOUR PO) Take by mouth as needed.   albuterol (VENTOLIN HFA) 108 (90 Base) MCG/ACT inhaler Inhale 1-2 puffs into the lungs every 6 (six) hours as needed for wheezing or  shortness of breath.   gabapentin (NEURONTIN) 600 MG tablet TAKE 1 TABLET (600 MG TOTAL) BY MOUTH 3 (THREE) TIMES DAILY AS NEEDED.   Spacer/Aero-Holding Chambers [facility] DIAMOND) MISC by Does not apply route.   apixaban (ELIQUIS) 5 MG TABS tablet Take by mouth. (Patient not taking: No sig reported)   hydrOXYzine (ATARAX/VISTARIL) 25 MG tablet Take 1 tablet (25 mg total) by mouth 3 (three) times daily as needed for anxiety or itching. (Patient not taking: No sig reported)   metoprolol succinate (TOPROL-XL) 50 MG 24 hr tablet Take by mouth. (Patient not taking: No sig reported)   ondansetron (ZOFRAN) 4 MG tablet Take 1 tablet (4 mg total) by mouth every 8 (eight) hours as needed for nausea or vomiting. (Patient not taking: No sig reported)   spironolactone (ALDACTONE) 25 MG tablet Take by mouth. (Patient not taking: No sig reported)   venlafaxine XR (EFFEXOR-XR) 75 MG 24 hr capsule TAKE 1 CAPSULE BY MOUTH DAILY WITH BREAKFAST. (Patient not taking: No sig reported)   No facility-administered encounter medications on file as of 04/10/2021.    Allergies (verified) Patient has no known allergies.   History: Past Medical History:  Diagnosis Date   History of shingles    Knee pain    Migraines    Past Surgical History:  Procedure Laterality Date   COLONOSCOPY WITH PROPOFOL N/A 02/19/2020   Procedure: COLONOSCOPY WITH PROPOFOL;  Surgeon: 02/21/2020, MD;  Location: [facility] ENDOSCOPY;  Service: Endoscopy;  Laterality: N/A;   REPLACEMENT TOTAL KNEE     left knee   TUBAL LIGATION     Family History  Problem Relation Age of Onset   Heart disease Mother    Hypertension Mother    Cancer Father    Cancer Brother    Social History   Socioeconomic History   Marital status: Widowed    Spouse name: Not on file   Number of children: Not on file   Years of education: Not on file   Highest education level: Not on file  Occupational History   Occupation: retired  Tobacco Use    Smoking status: Never   Smokeless tobacco: Never  Vaping Use   Vaping Use: Never used  Substance and Sexual Activity   Alcohol use: No   Drug use: No   Sexual activity: Not Currently  Other Topics Concern   Not on file  Social History Narrative   Not on file   Social Determinants of Health   Financial Resource Strain: Low Risk    Difficulty of Paying Living Expenses: Not hard at all  Food Insecurity: No Food Insecurity   Worried About Programme researcher, broadcasting/film/video in the Last Year: Never true   Ran Out of Food in the Last Year: Never true  Transportation Needs: No Transportation Needs   Lack of Transportation (Medical): No   Lack of Transportation (Non-Medical): No  Physical Activity: Inactive   Days of Exercise per Week: 0 days   Minutes of Exercise per Session: 0 min  Stress: No Stress Concern Present   Feeling of Stress : Not at all  Social Connections: Socially Isolated   Frequency of Communication with Friends and Family: More than three times a week   Frequency of Social Gatherings with Friends and Family: More than three times a week   Attends Religious Services: Never   Database administrator or Organizations: No   Attends Banker Meetings: Never   Marital Status: Widowed    Tobacco Counseling Counseling given: Not Answered   Clinical Intake:  Pre-visit preparation completed: Yes  Pain : No/denies pain     Nutritional Status: BMI > 30  Obese Nutritional Risks: None Diabetes: No  How often do you need to have someone help you when you read instructions, pamphlets, or other written materials from your doctor or pharmacy?: 1 - Never What is the last grade level you completed in school?: 9th grade  Diabetic? no  Interpreter Needed?: No  Information entered by :: NAllen LPN   Activities of Daily Living In your present state of health, do you have any difficulty performing the following activities: 04/10/2021  Hearing? N  Vision? N  Difficulty  concentrating or making decisions? N  Walking or climbing stairs? N  Dressing or bathing? N  Doing errands, shopping? N  Preparing Food and eating ? N  Using the Toilet? N  In the past six months, have you accidently leaked urine? N  Do you have problems with loss of bowel control? N  Managing your Medications? N  Managing your Finances? N  Housekeeping or managing your Housekeeping? N  Some recent data might be hidden    Patient Care Team: Larae Grooms, NP as PCP - General Arlana Pouch Megan Mans, RN as Registered Nurse (General Practice)  Indicate any recent Medical Services you may have received from other than [facility] providers in the past year (date may be approximate).     Assessment:   This is a routine wellness examination for [place].  Hearing/Vision screen Vision Screening - Comments:: No regular eye exams,  Dietary issues and exercise activities discussed:  Current Exercise Habits: The patient does not participate in regular exercise at present   Goals Addressed             This Visit's Progress    Patient Stated       04/10/2021, no goals       Depression Screen PHQ 2/9 Scores 04/10/2021 01/27/2021 06/22/2020 04/08/2020 12/18/2019  PHQ - 2 Score 0 5 5 0 0  PHQ- 9 Score - 8 8 - -    Fall Risk Fall Risk  04/10/2021 04/08/2020  Falls in the past year? 1 1  Comment lost balance loses balance  Number falls in past yr: 1 1  Injury with Fall? 0 1  Comment - sprained knee  Risk for fall due to : History of fall(s);Medication side effect Medication side effect;History of fall(s);Impaired mobility  Follow up Falls evaluation completed;Education provided;Falls prevention discussed Falls evaluation completed;Education provided;Falls prevention discussed    FALL RISK PREVENTION PERTAINING TO THE HOME:  Any stairs in or around the home? No  If so, are there any without handrails?  N/a Home free of loose throw rugs in walkways, pet beds, electrical cords, etc? Yes   Adequate lighting in your home to reduce risk of falls? Yes   ASSISTIVE DEVICES UTILIZED TO PREVENT FALLS:  Life alert? No  Use of a cane, walker or w/c? No  Grab bars in the bathroom? No  Shower chair or bench in shower? Yes  Elevated toilet seat or a handicapped toilet? Yes   TIMED UP AND GO:  Was the test performed? No .      Cognitive Function:     6CIT Screen 04/10/2021 04/08/2020  What Year? 0 points 0 points  What month? 0 points 0 points  What time? 0 points 0 points  Count back from 20 0 points 2 points  Months in reverse 4 points 4 points  Repeat phrase 6 points 0 points  Total Score 10 6    Immunizations Immunization History  Administered Date(s) Administered   Zoster Recombinat (Shingrix) 04/16/2020    TDAP status: Due, Education has been provided regarding the importance of this vaccine. Advised may receive this vaccine at local pharmacy or Health Dept. Aware to provide a copy of the vaccination record if obtained from local pharmacy or Health Dept. Verbalized acceptance and understanding.  Flu Vaccine status: Declined, Education has been provided regarding the importance of this vaccine but patient still declined. Advised may receive this vaccine at local pharmacy or Health Dept. Aware to provide a copy of the vaccination record if obtained from local pharmacy or Health Dept. Verbalized acceptance and understanding.  Pneumococcal vaccine status: Declined,  Education has been provided regarding the importance of this vaccine but patient still declined. Advised may receive this vaccine at local pharmacy or Health Dept. Aware to provide a copy of the vaccination record if obtained from local pharmacy or Health Dept. Verbalized acceptance and understanding.   Covid-19 vaccine status: Declined, Education has been provided regarding the importance of this vaccine but patient still declined. Advised may receive this vaccine at local pharmacy or Health Dept.or vaccine  clinic. Aware to provide a copy of the vaccination record if obtained from local pharmacy or Health Dept. Verbalized acceptance and understanding.  Qualifies for Shingles Vaccine? Yes   Zostavax completed No   Shingrix Completed?: No.    Education has been provided regarding the importance of this vaccine. Patient has been advised to call insurance company to determine out of pocket  expense if they have not yet received this vaccine. Advised may also receive vaccine at local pharmacy or Health Dept. Verbalized acceptance and understanding.  Screening Tests Health Maintenance  Topic Date Due   COLONOSCOPY (Pts 45-14yrs Insurance coverage will need to be confirmed)  02/18/2021   COVID-19 Vaccine (1) 04/26/2021 (Originally 03/07/1951)   Zoster Vaccines- Shingrix (2 of 2) 04/27/2021 (Originally 06/11/2020)   INFLUENZA VACCINE  10/20/2021 (Originally 02/20/2021)   DEXA SCAN  04/10/2022 (Originally 09/07/2016)   TETANUS/TDAP  04/10/2022 (Originally 09/07/1970)   MAMMOGRAM  02/14/2022   Hepatitis C Screening  Completed   HPV VACCINES  Aged Out    Health Maintenance  Health Maintenance Due  Topic Date Due   COLONOSCOPY (Pts 45-73yrs Insurance coverage will need to be confirmed)  02/18/2021    Colorectal cancer screening: Type of screening: Colonoscopy. Completed 02/19/2020. Repeat every 2 years  Mammogram status: patient to schedule  Bone Density status: decline  Lung Cancer Screening: (Low Dose CT Chest recommended if Age 22-80 years, 30 pack-year currently smoking OR have quit w/in 15years.) does not qualify.   Lung Cancer Screening Referral: no  Additional Screening:  Hepatitis C Screening: does qualify; Completed 01/19/2020  Vision Screening: Recommended annual ophthalmology exams for early detection of glaucoma and other disorders of the eye. Is the patient up to date with their annual eye exam?  No  Who is the provider or what is the name of the office in which the patient attends  annual eye exams? none If pt is not established with a provider, would they like to be referred to a provider to establish care? No .   Dental Screening: Recommended annual dental exams for proper oral hygiene  Community Resource Referral / Chronic Care Management: CRR required this visit?  No   CCM required this visit?  No      Plan:     I have personally reviewed and noted the following in the patient's chart:   Medical and social history Use of alcohol, tobacco or illicit drugs  Current medications and supplements including opioid prescriptions.  Functional ability and status Nutritional status Physical activity Advanced directives List of other physicians Hospitalizations, surgeries, and ER visits in previous 12 months Vitals Screenings to include cognitive, depression, and falls Referrals and appointments  In addition, I have reviewed and discussed with patient certain preventive protocols, quality metrics, and best practice recommendations. A written personalized care plan for preventive services as well as general preventive health recommendations were provided to patient.     Barb Merino, LPN   10/19/760   Nurse Notes:

## 2021-04-10 NOTE — Patient Instructions (Signed)
[name] , Thank you for taking time to come for your Medicare Wellness Visit. I appreciate your ongoing commitment to your health goals. Please review the following plan we discussed and let me know if I can assist you in the future.   Screening recommendations/referrals: Colonoscopy: completed 02/19/2020, due 02/18/2022 Mammogram: patient to schedule Bone Density: decline Recommended yearly ophthalmology/optometry visit for glaucoma screening and checkup Recommended yearly dental visit for hygiene and checkup  Vaccinations: Influenza vaccine: decline Pneumococcal vaccine: decline Tdap vaccine: decline Shingles vaccine:  04/16/2020   Covid-19:decline  Advanced directives: Advance directive discussed with you today.   Conditions/risks identified: none  Next appointment: Follow up in one year for your annual wellness visit    Preventive Care 65 Years and Older, Female Preventive care refers to lifestyle choices and visits with your health care provider that can promote health and wellness. What does preventive care include? A yearly physical exam. This is also called an annual well check. Dental exams once or twice a year. Routine eye exams. Ask your health care provider how often you should have your eyes checked. Personal lifestyle choices, including: Daily care of your teeth and gums. Regular physical activity. Eating a healthy diet. Avoiding tobacco and drug use. Limiting alcohol use. Practicing safe sex. Taking low-dose aspirin every day. Taking vitamin and mineral supplements as recommended by your health care provider. What happens during an annual well check? The services and screenings done by your health care provider during your annual well check will depend on your age, overall health, lifestyle risk factors, and family history of disease. Counseling  Your health care provider may ask you questions about your: Alcohol use. Tobacco use. Drug use. Emotional  well-being. Home and relationship well-being. Sexual activity. Eating habits. History of falls. Memory and ability to understand (cognition). Work and work Astronomer. Reproductive health. Screening  You may have the following tests or measurements: Height, weight, and BMI. Blood pressure. Lipid and cholesterol levels. These may be checked every 5 years, or more frequently if you are over 76 years old. Skin check. Lung cancer screening. You may have this screening every year starting at age 71 if you have a 30-pack-year history of smoking and currently smoke or have quit within the past 15 years. Fecal occult blood test (FOBT) of the stool. You may have this test every year starting at age 54. Flexible sigmoidoscopy or colonoscopy. You may have a sigmoidoscopy every 5 years or a colonoscopy every 10 years starting at age 16. Hepatitis C blood test. Hepatitis B blood test. Sexually transmitted disease (STD) testing. Diabetes screening. This is done by checking your blood sugar (glucose) after you have not eaten for a while (fasting). You may have this done every 1-3 years. Bone density scan. This is done to screen for osteoporosis. You may have this done starting at age 23. Mammogram. This may be done every 1-2 years. Talk to your health care provider about how often you should have regular mammograms. Talk with your health care provider about your test results, treatment options, and if necessary, the need for more tests. Vaccines  Your health care provider may recommend certain vaccines, such as: Influenza vaccine. This is recommended every year. Tetanus, diphtheria, and acellular pertussis (Tdap, Td) vaccine. You may need a Td booster every 10 years. Zoster vaccine. You may need this after age 57. Pneumococcal 13-valent conjugate (PCV13) vaccine. One dose is recommended after age 48. Pneumococcal polysaccharide (PPSV23) vaccine. One dose is recommended after age 65.  Talk to your  health care provider about which screenings and vaccines you need and how often you need them. This information is not intended to replace advice given to you by your health care provider. Make sure you discuss any questions you have with your health care provider. Document Released: 08/05/2015 Document Revised: 03/28/2016 Document Reviewed: 05/10/2015 Elsevier Interactive Patient Education  2017 [employer] Prevention in the Home Falls can cause injuries. They can happen to people of all ages. There are many things you can do to make your home safe and to help prevent falls. What can I do on the outside of my home? Regularly fix the edges of walkways and driveways and fix any cracks. Remove anything that might make you trip as you walk through a door, such as a raised step or threshold. Trim any bushes or trees on the path to your home. Use bright outdoor lighting. Clear any walking paths of anything that might make someone trip, such as rocks or tools. Regularly check to see if handrails are loose or broken. Make sure that both sides of any steps have handrails. Any raised decks and porches should have guardrails on the edges. Have any leaves, snow, or ice cleared regularly. Use sand or salt on walking paths during winter. Clean up any spills in your garage right away. This includes oil or grease spills. What can I do in the bathroom? Use night lights. Install grab bars by the toilet and in the tub and shower. Do not use towel bars as grab bars. Use non-skid mats or decals in the tub or shower. If you need to sit down in the shower, use a plastic, non-slip stool. Keep the floor dry. Clean up any water that spills on the floor as soon as it happens. Remove soap buildup in the tub or shower regularly. Attach bath mats securely with double-sided non-slip rug tape. Do not have throw rugs and other things on the floor that can make you trip. What can I do in the bedroom? Use night  lights. Make sure that you have a light by your bed that is easy to reach. Do not use any sheets or blankets that are too big for your bed. They should not hang down onto the floor. Have a firm chair that has side arms. You can use this for support while you get dressed. Do not have throw rugs and other things on the floor that can make you trip. What can I do in the kitchen? Clean up any spills right away. Avoid walking on wet floors. Keep items that you use a lot in easy-to-reach places. If you need to reach something above you, use a strong step stool that has a grab bar. Keep electrical cords out of the way. Do not use floor polish or wax that makes floors slippery. If you must use wax, use non-skid floor wax. Do not have throw rugs and other things on the floor that can make you trip. What can I do with my stairs? Do not leave any items on the stairs. Make sure that there are handrails on both sides of the stairs and use them. Fix handrails that are broken or loose. Make sure that handrails are as long as the stairways. Check any carpeting to make sure that it is firmly attached to the stairs. Fix any carpet that is loose or worn. Avoid having throw rugs at the top or bottom of the stairs. If you do have throw  rugs, attach them to the floor with carpet tape. Make sure that you have a light switch at the top of the stairs and the bottom of the stairs. If you do not have them, ask someone to add them for you. What else can I do to help prevent falls? Wear shoes that: Do not have high heels. Have rubber bottoms. Are comfortable and fit you well. Are closed at the toe. Do not wear sandals. If you use a stepladder: Make sure that it is fully opened. Do not climb a closed stepladder. Make sure that both sides of the stepladder are locked into place. Ask someone to hold it for you, if possible. Clearly mark and make sure that you can see: Any grab bars or handrails. First and last  steps. Where the edge of each step is. Use tools that help you move around (mobility aids) if they are needed. These include: Canes. Walkers. Scooters. Crutches. Turn on the lights when you go into a dark area. Replace any light bulbs as soon as they burn out. Set up your furniture so you have a clear path. Avoid moving your furniture around. If any of your floors are uneven, fix them. If there are any pets around you, be aware of where they are. Review your medicines with your doctor. Some medicines can make you feel dizzy. This can increase your chance of falling. Ask your doctor what other things that you can do to help prevent falls. This information is not intended to replace advice given to you by your health care provider. Make sure you discuss any questions you have with your health care provider. Document Released: 05/05/2009 Document Revised: 12/15/2015 Document Reviewed: 08/13/2014 Elsevier Interactive Patient Education  2017 [employer].

## 2021-04-22 DIAGNOSIS — Z20822 Contact with and (suspected) exposure to covid-19: Secondary | ICD-10-CM | POA: Diagnosis not present

## 2021-04-23 DIAGNOSIS — Z20822 Contact with and (suspected) exposure to covid-19: Secondary | ICD-10-CM | POA: Diagnosis not present

## 2021-04-24 DIAGNOSIS — Z20822 Contact with and (suspected) exposure to covid-19: Secondary | ICD-10-CM | POA: Diagnosis not present

## 2021-04-27 ENCOUNTER — Telehealth: Payer: Self-pay | Admitting: Nurse Practitioner

## 2021-04-27 NOTE — Telephone Encounter (Signed)
   Telephone encounter was:  Successful.  04/27/2021 Name: [name] MRN: [id number] DOB: [id number]  [name] is a 70 y.o. year old female who is a primary care patient of Larae Grooms, NP . The community resource team was consulted for assistance with  financial strain.  Care guide performed the following interventions: Patient provided with information about care guide support team and interviewed to confirm resource needs Patient denied talking about financial strain with her and said she is not having any issues with financials or food .  Follow Up Plan:  No further follow up planned at this time. The patient has been provided with needed resources.  April Green Care Guide, Embedded Care Coordination [facility], Care Management Phone: [phone] Email: [email or web address]

## 2021-04-28 ENCOUNTER — Ambulatory Visit (INDEPENDENT_AMBULATORY_CARE_PROVIDER_SITE_OTHER): Payer: Medicare HMO

## 2021-04-28 ENCOUNTER — Telehealth: Payer: Medicare HMO | Admitting: General Practice

## 2021-04-28 DIAGNOSIS — I10 Essential (primary) hypertension: Secondary | ICD-10-CM

## 2021-04-28 DIAGNOSIS — L539 Erythematous condition, unspecified: Secondary | ICD-10-CM

## 2021-04-28 DIAGNOSIS — R030 Elevated blood-pressure reading, without diagnosis of hypertension: Secondary | ICD-10-CM

## 2021-04-28 DIAGNOSIS — M1711 Unilateral primary osteoarthritis, right knee: Secondary | ICD-10-CM

## 2021-04-28 DIAGNOSIS — I5030 Unspecified diastolic (congestive) heart failure: Secondary | ICD-10-CM

## 2021-04-28 DIAGNOSIS — I4891 Unspecified atrial fibrillation: Secondary | ICD-10-CM

## 2021-04-28 DIAGNOSIS — F4321 Adjustment disorder with depressed mood: Secondary | ICD-10-CM

## 2021-04-28 DIAGNOSIS — Z9181 History of falling: Secondary | ICD-10-CM

## 2021-04-28 NOTE — Chronic Care Management (AMB) (Signed)
Chronic Care Management   CCM RN Visit Note  04/28/2021 Name: [name] MRN: [id number] DOB: 04/19/1951  Subjective: [name] is a 70 y.o. year old female who is a primary care patient of Jon Billings, NP. The care management team was consulted for assistance with disease management and care coordination needs.    Engaged with patient by telephone for follow up visit in response to provider referral for case management and/or care coordination services.   Consent to Services:  The patient was given information about Chronic Care Management services, agreed to services, and gave verbal consent prior to initiation of services.  Please see initial visit note for detailed documentation.   Patient agreed to services and verbal consent obtained.   Assessment: Review of patient past medical history, allergies, medications, health status, including review of consultants reports, laboratory and other test data, was performed as part of comprehensive evaluation and provision of chronic care management services.   SDOH (Social Determinants of Health) assessments and interventions performed:    CCM Care Plan  No Known Allergies  Outpatient Encounter Medications as of 04/28/2021  Medication Sig   Acetaminophen (TYLENOL 8 HOUR PO) Take by mouth as needed.   gabapentin (NEURONTIN) 600 MG tablet Take 1 tablet (600 mg total) by mouth 3 (three) times daily as needed.   albuterol (VENTOLIN HFA) 108 (90 Base) MCG/ACT inhaler Inhale 1-2 puffs into the lungs every 6 (six) hours as needed for wheezing or shortness of breath.   apixaban (ELIQUIS) 5 MG TABS tablet Take by mouth. (Patient not taking: No sig reported)   hydrOXYzine (ATARAX/VISTARIL) 25 MG tablet Take 1 tablet (25 mg total) by mouth 3 (three) times daily as needed for anxiety or itching. (Patient not taking: No sig reported)   metoprolol succinate (TOPROL-XL) 50 MG 24 hr tablet Take by mouth. (Patient not taking: No sig reported)    ondansetron (ZOFRAN) 4 MG tablet Take 1 tablet (4 mg total) by mouth every 8 (eight) hours as needed for nausea or vomiting. (Patient not taking: No sig reported)   Spacer/Aero-Holding Chambers ([place]) MISC by Does not apply route.   spironolactone (ALDACTONE) 25 MG tablet Take by mouth. (Patient not taking: No sig reported)   venlafaxine XR (EFFEXOR-XR) 75 MG 24 hr capsule TAKE 1 CAPSULE BY MOUTH DAILY WITH BREAKFAST. (Patient not taking: No sig reported)   No facility-administered encounter medications on file as of 04/28/2021.    Patient Active Problem List   Diagnosis Date Noted   Atrial fibrillation ([place]) 08/24/2020   Pneumonia due to COVID-19 virus 08/24/2020   (HFpEF) heart failure with preserved ejection fraction ([place]) 08/24/2020   Viral URI 08/03/2020   Postherpetic neuralgia 03/29/2020   Grief 03/29/2020   Cough 03/29/2020   Encounter for screening colonoscopy    Elevated blood pressure reading 01/19/2020   Osteoarthritis of right knee 12/21/2019   Morbid obesity due to excess calories ([place]) 08/13/2015   Chronic pain of right knee 08/13/2015    Conditions to be addressed/monitored:Atrial Fibrillation, CHF, HTN, and Chronic pain, grief, frequent falls, skin redness to mouth, chin, and ear  Care Plan : RNCM: Management of Chronic Diseases: HF, AFIB, HTN, Chronic pain, grief and frequent falls  Updates made by Vanita Ingles, RN since 04/28/2021 12:00 AM     Problem: RNCM: Management of HF, AFIB, HTN, chronic pain, grief, and frequent falls   Priority: High     Long-Range Goal: RNCM: Effective management of HT, AFIB, HTN, Chronic  pain, grief, and falls prevention   Start Date: 01/27/2021  Expected End Date: 01/27/2022  This Visit's Progress: On track  Recent Progress: On track  Priority: High  Note:   Current Barriers:  Knowledge Deficits related to plan of care for management of Atrial Fibrillation, CHF, HTN, and Chronic pain, grief and falls  Care  Coordination needs related to Level of care concerns, Medication procurement, and Mental Health Concerns  in a patient with Atrial Fibrillation, CHF, HTN, and chronic pain, grief, falls  Chronic Disease Management support and education needs related to Atrial Fibrillation, CHF, HTN, and chronic pain, grief, falls Financial Constraints.  Difficulty obtaining medications  RNCM Clinical Goal(s):  Patient will verbalize understanding of plan for management of Atrial Fibrillation, CHF, HTN, and Chronic pain, grief and falls work with RN Case Manager and Pharmacist to address needs related to Atrial Fibrillation, CHF, HTN, and chronic pain, grief and falls and Financial constraints related to inability to pay for medications , Level of care concerns, and Mental Health Concerns  take all medications exactly as prescribed and will call provider for medication related questions attend all scheduled medical appointments: cardiologist on 03-13-2021, as needed with the pcp demonstrate a decrease in Atrial Fibrillation, CHF, HTN, and Chronic pain, grief and fall exacerbations demonstrate improved adherence to prescribed treatment plan for Atrial Fibrillation, CHF, HTN, and Chronic pain, grief, and falls  demonstrate improved health management independence verbalize basic understanding of Atrial Fibrillation, CHF, HTN, and Chronic pain, grief, and fall disease process and self health management plan demonstrate understanding of rationale for each prescribed medication work with CM team pharmacist to assist with medication cost constraints and reconciliation demonstrate ongoing self health care management ability through collaboration with RN Care manager, provider, and care team.   Interventions: 1:1 collaboration with Jon Billings, regarding development and update of comprehensive plan of care as evidenced by provider attestation and co-signature Inter-disciplinary care team collaboration (see  longitudinal plan of care)   A-fib:  (Status: Goal on track: YES.) Counseled on increased risk of stroke due to Afib and benefits of anticoagulation for stroke prevention; Reviewed importance of adherence to anticoagulant exactly as prescribed, the patient states she went for a cardioversion and they told her she did not need it. She has been off of  her medications. The patient states she will do what the doctor tells her to do but right now she is not taking any medications for her heart conditions.  She further states that she can not afford the medications and would need help with options if the MD wanted her on certain medications. 04-28-2021: The patient states that she does not have to take medications for her heart health. Discussed with her the medications list and she said that she went to have the procedure but did not have to have. The patient denies any new issues with her AFIB. Will collaborate with the pcp for recommendations.  Advised patient to discuss options for medications, plan of care, possibility of wearing a hollister monitor for evaluation of heart rhythm  with provider; Counseled on avoidance of NSAIDs due to increased bleeding risk with anticoagulants; Counseled on importance of regular laboratory monitoring as prescribed; Counseled on seeking medical attention after a head injury or if there is blood in the urine/stool; Afib action plan reviewed;  Falls:  (Status: Goal on track: YES) Provided written and verbal education re: potential causes of falls and Fall prevention strategies Reviewed medications and discussed potential side effects of medications such  as dizziness and frequent urination Advised patient of importance of notifying provider of falls. Education and support given.  Assessed for signs and symptoms of orthostatic hypotension. Denies issues with orthostatic hypotension or dizziness  Assessed for falls since last encounter. Patient states last fall was  01-10-2021. The patient was using her walker but states she lost her balance and that is why she fell. The patient denies injury. 04-28-2021: The patient denies any new falls today. The patient states she is using her cane most of the time. States she wants to be careful and not fall. She feels like she is doing well.  Assessed patients knowledge of fall risk prevention secondary to previously provided education Provided patient information for fall alert systems Advised patient to discuss falls, safety in the home, concerns about safety and when she has new falls  with provider Assessed social determinant of health barriers  Heart Failure Interventions:  (Status: Goal on track: YES) Basic overview and discussion of pathophysiology of Heart Failure reviewed; Provided education on low sodium diet- the patient states she does not use salt in her diet. The patient stated compliance with a heart healthy diet; Reviewed Heart Failure Action Plan in depth and provided written copy; Assessed need for readable accurate scales in home- the patient does not have a scale but states she can get on. Reviewed the importance of daily weights with the patient due to the subtle changes that can alert the patient to exacerbations with heart failure. ; Provided education about placing scale on hard, flat surface- reviewed- patient to get a scale; Advised patient to weigh each morning after emptying bladder; Discussed importance of daily weight and advised patient to weigh and record daily. 04-28-2021: The patient did not have any weights for the [facility]. Ask the patient to start a record of blood pressures and weights to have on hand at the next outreach. The patient states that she has the blood pressure cuff and has been taking but could not give the RNCM any numbers.  Reviewed role of diuretics in prevention of fluid overload and management of heart failure. 04-28-2021: Is not taking any diuretics at this time. Review and  eduction provided.  Discussed the importance of keeping all appointments with provider; Provided patient with education about the role of exercise in the management of heart failure; Advised patient to discuss heart failure plan of care  with provider; Reviewed EF% with the patient and education provided on what the EF% was. Patients last EF% was 60-65% in January of 2022.  Hypertension: (Status: Goal on track: NO) Last practice recorded BP readings:  BP Readings from Last 3 Encounters:  01/25/21 (!) 175/67  01/19/21 (!) 147/67  08/24/20 126/80  Most recent eGFR/CrCl:  Lab Results  Component Value Date   EGFR 81 01/19/2021    No components found for: CRCL  Evaluation of current treatment plan related to hypertension self management and patient's adherence to plan as established by provider; Provided education to patient re: stroke prevention, s/s of heart attack and stroke; Reviewed medications with patient and discussed importance of compliance. 04-28-2021: The patient is not taking any medications except Tylenol and Gabapentin. The patient states that she does not need medications for her heart. Education on the risk of elevated blood pressures and the need to take medications as directed. ; Provided assistance with obtaining home blood pressure monitor via assistance from [facility] and Lavell Islam, will send an inbasket message to Lavell Islam and Janalyn Shy inquiring about the  patient getting a blood pressure cuff to use so she can take her blood pressures at home and record.  04-28-2021: The patient does have the blood pressure cuff that was sent to her. She states she had not taken her blood pressure today but has taken it and it was good. The patient was unable to supply any numbers to the [facility]. Reviewed with the patient to take blood pressures and write down values to have at next outreach. The patient verbalized understanding;  Counseled on the importance of exercise goals with target of 150  minutes per week Discussed plans with patient for ongoing care management follow up and provided patient with direct contact information for care management team; Advised patient, providing education and rationale, to monitor blood pressure daily and record, calling PCP for findings outside established parameters;  Reviewed scheduled/upcoming provider appointments including: no upcoming appointments. Reminded the patient to call for changes  Advised patient to discuss medications options and effective management of elevated blood pressures with provider; Provided education on prescribed diet heart healthy diet. 04-28-2021: The patient states she is eating well and denies any issues with dietary restrictions of Heart Healthy diet ;  Discussed complications of poorly controlled blood pressure such as heart disease, stroke, circulatory complications, vision complications, kidney impairment, sexual dysfunction;   Grief   (Status: Goal on track: YES) Evaluation of current treatment plan related to  grief , Mental Health Concerns  self-management and patient's adherence to plan as established by provider. Discussed plans with patient for ongoing care management follow up and provided patient with direct contact information for care management team Evaluation of current treatment plan related to grief process  and patient's adherence to plan as established by provider; Advised patient to call the office for changes in mood, anxiety, or depression ; Provided education to patient re: griefshare support group locally; Reviewed medications with patient and discussed compliance, the patient is not currenttly taking anything for depression or anxiety; Provided patient with grief support  educational materials related to loss of her husband almost 7 years ago and her son a year ago, the patient has a lot of stressors in her life over grief and loss. 04-28-2021: The patient is doing well. Denies any new issues related  to depression or grief. She had covid and pneumonia and got over that. Feels she is a strong person and denies any new concerns with her mental health and well being ; Social Work referral for assistance with grief, but the patient declined services at this time. The patient knows the LCSW is available to assist as needed for support and education; Discussed plans with patient for ongoing care management follow up and provided patient with direct contact information for care management team; Advised patient to discuss grief and stress  with provider; Screening for signs and symptoms of depression related to chronic disease state;  Assessed social determinant of health barriers;   Pain:  (Status: Goal on track: YES) Pain assessment performed, patient rates chronic back pain at a 7 today on a scale of 0-10. 04-28-2021: Denies pain today. States the gabapentin and Tylenol are effective for controlling her pain Medications reviewed. The patient only takes Tylenol for pain relief. 04-28-2021: Takes Tylenol and Gabapentin for pain relief and denies any concerns with pain today.  Reviewed provider established plan for pain management; Discussed importance of adherence to all scheduled medical appointments; Counseled on the importance of reporting any/all new or changed pain symptoms or management strategies to pain management  provider; Advised patient to report to care team affect of pain on daily activities; Discussed use of relaxation techniques and/or diversional activities to assist with pain reduction (distraction, imagery, relaxation, massage, acupressure, TENS, heat, and cold application; Reviewed with patient prescribed pharmacological and nonpharmacological pain relief strategies; Advised patient to discuss pain relief options  with provider;  Red area to chin, mouth, and ear  (Status: New goal.) Evaluation of current treatment plan related to  skin irritation with redness to chin, mouth, and ear  ,  self-management and patient's adherence to plan as established by provider. Discussed plans with patient for ongoing care management follow up and provided patient with direct contact information for care management team Advised patient to call the office to get an appointment with the pcp for evaluation of skin irritation to chin, mouth, and ear. States it is a "redness" to her chin, mouth and ear that she has had x 1 month. When she applies cream it "burns". Denies any bug bites. Will collaborate with the pcp for evaluation and treatment options; Provided education to patient re: calling the office for worsening condition, questions or concerns; Advised patient to discuss skin redness to chin, mouth, and ear with provider;   Patient Goals/Self-Care Activities: Patient will self administer medications as prescribed Patient will attend all scheduled provider appointments Patient will call pharmacy for medication refills Patient will attend church or other social activities Patient will continue to perform ADL's independently Patient will continue to perform IADL's independently Patient will call provider office for new concerns or questions Patient will work with BSW to address care coordination needs and will continue to work with the clinical team to address health care and disease management related needs.    Follow Up Plan: Telephone follow up appointment with care management team member scheduled for:  06-23-2021 at 0900 am     Plan:Telephone follow up appointment with care management team member scheduled for:  06-23-2021 at 0900 am  Noreene Larsson RN, MSN, [facility] Mobile: [phone]

## 2021-04-28 NOTE — Patient Instructions (Signed)
Visit Information  PATIENT GOALS:  Goals Addressed             This Visit's Progress    RNCM: Keep Low Back Pain Under Control       Timeframe:  Long-Range Goal Priority:  High Start Date:    01-27-2021                         Expected End Date:      01-27-2022                 Follow Up Date 06/23/2021    - call for medicine refill 2 or 3 days before it runs out - develop a personal pain management plan - keep track of prescription refills - plan exercise or activity when pain is best controlled - prioritize tasks for the day - stay active - track times pain is worst and when it is best - track what makes the pain worse and what makes it better - use ice or heat for pain relief - work slower and less intense when having pain    Why is this important?   Day-to-day life can be hard when you have back pain.  Pain medicine is just one piece of the treatment puzzle. There are many things you can do to manage pain and keep your back strong.   Lifestyle changes, like stopping smoking and eating foods with Vitamin D and calcium, keep your bones and muscles healthy. Your back is better when it is supported by strong muscles.  You can try these action steps to help you manage your pain.     Notes: Patient rates her pain at a 7 today on a scale of 0-10. The patient states that she only takes Tylenol for pain. Orthopedic consult placed this week from pcp. 04-28-2021: The patient denies any back pain today. States that she is taking the gabapentin and Tylenol and it is effective. Denies any acute findings. Will continue to monitor.      RNCM: Manage My Emotions       Timeframe:  Long-Range Goal Priority:  High Start Date:      01-27-2021                       Expected End Date:  01-27-2022                     Follow Up Date 06/23/2021    - call and visit an old friend - join a support group - laugh; watch a funny movie or comedian - learn and use visualization or guided imagery - perform a  random act of kindness - practice relaxation or meditation daily - talk about feelings with a friend, family or spiritual advisor - practice positive thinking and self-talk    Why is this important?   When you are stressed, down or upset, your body reacts too.  For example, your blood pressure may get higher; you may have a headache or stomachache.  When your emotions get the best of you, your body's ability to fight off cold and flu gets weak.  These steps will help you manage your emotions.     Notes: The patient lost her husband of 45 years almost 7 years ago (November).  The patient lost a son a year ago. She has a lot of grief and sadness. Offered LCSW but the patient declined. She knows she has  this resources available. Empathetic listening and support given. Discussed the griefshare program and the patient knows this is available. 04-28-2021: The patient feels she is stable right now and does not have any new concerns with her depression or grief. Reflective listening and support given     RNCM: Manage My Medicine       Timeframe:  Short-Term Goal Priority:  High Start Date:     01-27-2021                        Expected End Date:     07-21-2021                  Follow Up Date 06/23/2021    - call for medicine refill 2 or 3 days before it runs out - call if I am sick and can't take my medicine - keep a list of all the medicines I take; vitamins and herbals too - learn to read medicine labels - use a pillbox to sort medicine - use an alarm clock or phone to remind me to take my medicine    Why is this important?   These steps will help you keep on track with your medicines.   Notes: The patient states that she does no take any medications but a few. She has stopped taking the cardiac medications as they told her she did not have AFIB. She is going to new cardiologist local in August. Education on elevated blood pressures and the need to control blood pressures. Will continue to  monitor and ask for pharm D assistance for management and reconciliation of medications. 04-28-2021: The patient states that she is only taking Tylenol and gabapentin. The patient states she does not have to take anything for her heart conditions. Education on talking to cardiologist and concern for blood pressure elevations. The patient states she feel fine. Will work with the pcp and collaborate on a course of action.      RNCM: Prevent Falls and Injury       Follow Up Date 06/23/2021    - add more outdoor lighting - always use handrails on the stairs - always wear low-heeled or flat shoes or slippers with nonskid soles - call the doctor if I am feeling too drowsy - install bathroom grab bars - keep a flashlight by the bed - keep my cell phone with me always - learn how to get back up if I fall - make an emergency alert plan in case I fall - pick up clutter from the floors - use a nonslip pad with throw rugs, or remove them completely - use a cane or walker    Why is this important?   Most falls happen when it is hard for you to walk safely. Your balance may be off because of an illness. You may have pain in your knees, hip or other joints.  You may be overly tired or taking medicines that make you sleepy. You may not be able to see or hear clearly.  Falls can lead to broken bones, bruises or other injuries.  There are things you can do to help prevent falling.     Notes: The patient has a walker and uses a walker. The patients son lives with her. Education on calling her insurance company and asking about life alert system. The patient says she loses her balance. Last fall was 01-10-2021. Denies any injuries. Education on being safe in environment. 04-28-2021: Denies  any new falls. The patient states she is using her cane and being careful when she ambulates. Feels she is being safe.      RNCM: Track and Manage Fluids and Swelling-Heart Failure       Timeframe:  Long-Range Goal Priority:   High Start Date:         01-27-2021                    Expected End Date: 01-27-2022                      Follow Up Date 06/23/2021    - call office if I gain more than 2 pounds in one day or 5 pounds in one week - keep legs up while sitting - track weight in diary - use salt in moderation - watch for swelling in feet, ankles and legs every day - weigh myself daily    Why is this important?   It is important to check your weight daily and watch how much salt and liquids you have.  It will help you to manage your heart failure.    Notes: Patient does not have a scale but states she can get one. Education on the subtle changes that can happen due to heart failure. Education on monitoring weight consistently and managing sx and sx of  heart failure. Explained what EF % was and review of  her %. 04-28-2021: The patient denies any swelling in her feet and legs. States she is doing very well. Unable to provide readings to the [facility]. Review of daily weights.         Patient verbalizes understanding of instructions provided today and agrees to view in MyChart.   Telephone follow up appointment with care management team member scheduled for: 06-23-2021 at 0900 am  Alto Denver RN, MSN, CCM Community Care Coordinator [facility]  [facility] Mobile: [phone]

## 2021-05-02 ENCOUNTER — Ambulatory Visit: Payer: Self-pay

## 2021-05-02 NOTE — Telephone Encounter (Signed)
Pt. Reports she has red skin on her face - forehead,nose, chin. Started 3 weeks ago. Has used any different soaps or lotions. No new medications. Appointment made for tomorrow.    Reason for Disposition  Mild widespread rash  Answer Assessment - Initial Assessment Questions 1. APPEARANCE of RASH: "Describe the rash." (e.g., spots, blisters, raised areas, skin peeling, scaly)     Red skin 2. SIZE: "How big are the spots?" (e.g., tip of pen, eraser, coin; inches, centimeters)     N/a 3. LOCATION: "Where is the rash located?"     Forehead, chin , nose and ears 4. COLOR: "What color is the rash?" (Note: It is difficult to assess rash color in people with darker-colored skin. When this situation occurs, simply ask the caller to describe what they see.)     Red 5. ONSET: "When did the rash begin?"     3 weeks ago 6. FEVER: "Do you have a fever?" If Yes, ask: "What is your temperature, how was it measured, and when did it start?"     No 7. ITCHING: "Does the rash itch?" If Yes, ask: "How bad is the itch?" (Scale 1-10; or mild, moderate, severe)     No 8. CAUSE: "What do you think is causing the rash?"     Unsure 9. MEDICINE FACTORS: "Have you started any new medicines within the last 2 weeks?" (e.g., antibiotics)      No 10. OTHER SYMPTOMS: "Do you have any other symptoms?" (e.g., dizziness, headache, sore throat, joint pain)       No 11. PREGNANCY: "Is there any chance you are pregnant?" "When was your last menstrual period?"       No  Protocols used: Rash or Redness - [facility]

## 2021-05-02 NOTE — Progress Notes (Signed)
BP 136/81   Pulse 87   Ht 5\' 5"  (1.651 m)   Wt 300 lb (136.1 kg)   BMI 49.92 kg/m    Subjective:    Patient ID: , female    DOB: 1951-05-03, 70 y.o.   MRN: [id number]  HPI: [name] is a 70 y.o. female  Chief Complaint  Patient presents with   Rash    On face present for 3 weeks to a month   RASH Duration:  weeks  Location: face  Itching: no Burning: yes Redness: yes Oozing: no Scaling: yes Blisters: no Painful: yes Fevers: no Change in detergents/soaps/personal care products: no Recent illness: no Recent travel:no History of same: no Context: stable Alleviating factors:  nothing Treatments attempted:nothing Shortness of breath: no  Throat/tongue swelling: no Myalgias/arthralgias: no  Relevant past medical, surgical, family and social history reviewed and updated as indicated. Interim medical history since our last visit reviewed. Allergies and medications reviewed and updated.  Review of Systems  Skin:  Positive for rash.   Per HPI unless specifically indicated above     Objective:    BP 136/81   Pulse 87   Ht 5\' 5"  (1.651 m)   Wt 300 lb (136.1 kg)   BMI 49.92 kg/m   Wt Readings from Last 3 Encounters:  05/03/21 300 lb (136.1 kg)  04/10/21 300 lb (136.1 kg)  01/19/21 (!) 307 lb 6.4 oz (139.4 kg)    Physical Exam Vitals and nursing note reviewed.  Constitutional:      General: She is not in acute distress.    Appearance: Normal appearance. She is normal weight. She is not ill-appearing, toxic-appearing or diaphoretic.  HENT:     Head: Normocephalic.     Right Ear: External ear normal.     Left Ear: External ear normal.     Nose: Nose normal.     Mouth/Throat:     Mouth: Mucous membranes are moist.     Pharynx: Oropharynx is clear.  Eyes:     General:        Right eye: No discharge.        Left eye: No discharge.     Extraocular Movements: Extraocular movements intact.     Conjunctiva/sclera: Conjunctivae normal.      Pupils: Pupils are equal, round, and reactive to light.  Cardiovascular:     Rate and Rhythm: Normal rate and regular rhythm.     Heart sounds: No murmur heard. Pulmonary:     Effort: Pulmonary effort is normal. No respiratory distress.     Breath sounds: Normal breath sounds. No wheezing or rales.  Musculoskeletal:     Cervical back: Normal range of motion and neck supple.  Skin:    General: Skin is warm and dry.     Capillary Refill: Capillary refill takes less than 2 seconds.     Findings: Rash present.       Neurological:     General: No focal deficit present.     Mental Status: She is alert and oriented to person, place, and time. Mental status is at baseline.  Psychiatric:        Mood and Affect: Mood normal.        Behavior: Behavior normal.        Thought Content: Thought content normal.        Judgment: Judgment normal.    Results for orders placed or performed in visit on 02/08/21  Urine Culture  Specimen: Urine   UR  Result Value Ref Range   Urine Culture, Routine Final report    Organism ID, Bacteria Comment   CBC w/Diff  Result Value Ref Range   WBC 3.9 3.4 - 10.8 x10E3/uL   RBC 4.08 3.77 - 5.28 x10E6/uL   Hemoglobin 12.9 11.1 - 15.9 g/dL   Hematocrit 42.6 83.4 - 46.6 %   MCV 93 79 - 97 fL   MCH 31.6 26.6 - 33.0 pg   MCHC 33.9 31.5 - 35.7 g/dL   RDW 19.6 22.2 - 97.9 %   Platelets 170 150 - 450 x10E3/uL   Neutrophils 60 Not Estab. %   Lymphs 24 Not Estab. %   Monocytes 10 Not Estab. %   Eos 5 Not Estab. %   Basos 1 Not Estab. %   Neutrophils Absolute 2.3 1.4 - 7.0 x10E3/uL   Lymphocytes Absolute 0.9 0.7 - 3.1 x10E3/uL   Monocytes Absolute 0.4 0.1 - 0.9 x10E3/uL   EOS (ABSOLUTE) 0.2 0.0 - 0.4 x10E3/uL   Basophils Absolute 0.0 0.0 - 0.2 x10E3/uL   Immature Granulocytes 0 Not Estab. %   Immature Grans (Abs) 0.0 0.0 - 0.1 x10E3/uL      Assessment & Plan:   Problem List Items Addressed This Visit   None Visit Diagnoses     Perioral  dermatitis    -  Primary   Will treat with oral antibiotics due to wide spread nature of rash. Will give topical flagyl in addition. Side effects and benefits discussed. FU it not improve        Follow up plan: Return if symptoms worsen or fail to improve.

## 2021-05-03 ENCOUNTER — Encounter: Payer: Self-pay | Admitting: Nurse Practitioner

## 2021-05-03 ENCOUNTER — Other Ambulatory Visit: Payer: Self-pay | Admitting: Nurse Practitioner

## 2021-05-03 ENCOUNTER — Telehealth: Payer: Self-pay | Admitting: Nurse Practitioner

## 2021-05-03 ENCOUNTER — Ambulatory Visit (INDEPENDENT_AMBULATORY_CARE_PROVIDER_SITE_OTHER): Payer: Medicare HMO | Admitting: Nurse Practitioner

## 2021-05-03 ENCOUNTER — Other Ambulatory Visit: Payer: Self-pay

## 2021-05-03 VITALS — BP 136/81 | HR 87 | Ht 65.0 in | Wt 300.0 lb

## 2021-05-03 DIAGNOSIS — L710 Perioral dermatitis: Secondary | ICD-10-CM

## 2021-05-03 MED ORDER — AMOXICILLIN 500 MG PO CAPS: 500.0000 mg | ORAL_CAPSULE | Freq: Two times a day (BID) | ORAL | 0 refills | Status: AC

## 2021-05-03 MED ORDER — METRONIDAZOLE 1 % EX GEL: Freq: Every day | CUTANEOUS | 0 refills | Status: DC

## 2021-05-03 NOTE — Telephone Encounter (Signed)
Copied from CRM [id number]. Topic: General - Other >> May 03, 2021  3:07 PM Gaetana Michaelis A wrote: Reason for CRM: The patient has been in touch with their pharmacy and told that a prior authorization is needed for their metroNIDAZOLE (METROGEL) 1 % gel [[id number]] prescription   The patient would like to be contacted when the prior authorization is submitted so they can get their medications  Please contact further if needed

## 2021-05-04 ENCOUNTER — Other Ambulatory Visit: Payer: Self-pay | Admitting: Nurse Practitioner

## 2021-05-04 NOTE — Telephone Encounter (Signed)
Please call the pharmacy and inform them of the response from Cover my Meds.

## 2021-05-04 NOTE — Telephone Encounter (Signed)
Spoke with [facility] pharmacy and notify pharmacist that patient's prior authorization was initiated via CoverMyMeds for Rx Metronidazole 1% gel and was approved. Pharmacist verbalized that the prescription went through that time. Patient was notified and verbalized understanding.

## 2021-05-04 NOTE — Telephone Encounter (Signed)
Requested medication (s) are due for refill today: Yes  Requested medication (s) are on the active medication list: Yes  Last refill:  05/03/2021  Future visit scheduled: No  Notes to clinic:  pharmacy requesting alternative medication.     Requested Prescriptions  Pending Prescriptions Disp Refills   ROSADAN 0.75 % cream [Pharmacy Med Name: ROSADAN 0.75% CREAM]  0     Off-Protocol Failed - 05/03/2021 11:12 AM      Failed - Medication not assigned to a protocol, review manually.      Passed - Valid encounter within last 12 months    Recent Outpatient Visits           Yesterday Perioral dermatitis   [facility] Larae Grooms, NP   3 months ago Heart failure with preserved ejection fraction, unspecified HF chronicity [facility])   [facility] Larae Grooms, NP   3 months ago Fall, initial encounter   [facility] Larae Grooms, NP   8 months ago Atrial fibrillation, unspecified type [facility])   [facility] Caro Laroche, DO   9 months ago Viral URI   [facility] Caro Laroche, DO       Future Appointments             In 11 months [facility], [facility]

## 2021-05-04 NOTE — Telephone Encounter (Signed)
Prior Authorization was initiated via CoverMyMeds for Rx Metronidazole 1% Gel. Once submitted the was a message from CoverMyMeds "The patient currently has access to the requested medication and a Prior Authorization is not needed for the patient/medication." Please advise?

## 2021-05-05 NOTE — Telephone Encounter (Signed)
Pharmacy requesting alternative med

## 2021-05-22 DIAGNOSIS — M1711 Unilateral primary osteoarthritis, right knee: Secondary | ICD-10-CM | POA: Diagnosis not present

## 2021-05-22 DIAGNOSIS — R69 Illness, unspecified: Secondary | ICD-10-CM | POA: Diagnosis not present

## 2021-05-22 DIAGNOSIS — F4321 Adjustment disorder with depressed mood: Secondary | ICD-10-CM | POA: Diagnosis not present

## 2021-05-22 DIAGNOSIS — I5030 Unspecified diastolic (congestive) heart failure: Secondary | ICD-10-CM

## 2021-05-22 DIAGNOSIS — I4891 Unspecified atrial fibrillation: Secondary | ICD-10-CM

## 2021-05-22 DIAGNOSIS — I10 Essential (primary) hypertension: Secondary | ICD-10-CM | POA: Diagnosis not present

## 2021-05-23 DIAGNOSIS — Z20822 Contact with and (suspected) exposure to covid-19: Secondary | ICD-10-CM | POA: Diagnosis not present

## 2021-05-24 DIAGNOSIS — Z20822 Contact with and (suspected) exposure to covid-19: Secondary | ICD-10-CM | POA: Diagnosis not present

## 2021-05-25 ENCOUNTER — Ambulatory Visit: Payer: Self-pay

## 2021-05-25 ENCOUNTER — Ambulatory Visit (INDEPENDENT_AMBULATORY_CARE_PROVIDER_SITE_OTHER): Payer: Medicare HMO | Admitting: Nurse Practitioner

## 2021-05-25 ENCOUNTER — Encounter: Payer: Self-pay | Admitting: Nurse Practitioner

## 2021-05-25 ENCOUNTER — Other Ambulatory Visit: Payer: Self-pay

## 2021-05-25 VITALS — BP 127/73 | HR 123 | Temp 97.4°F | Ht 62.0 in | Wt 306.0 lb

## 2021-05-25 DIAGNOSIS — R030 Elevated blood-pressure reading, without diagnosis of hypertension: Secondary | ICD-10-CM | POA: Diagnosis not present

## 2021-05-25 DIAGNOSIS — I4891 Unspecified atrial fibrillation: Secondary | ICD-10-CM | POA: Diagnosis not present

## 2021-05-25 DIAGNOSIS — Z8249 Family history of ischemic heart disease and other diseases of the circulatory system: Secondary | ICD-10-CM | POA: Diagnosis not present

## 2021-05-25 DIAGNOSIS — G8929 Other chronic pain: Secondary | ICD-10-CM | POA: Diagnosis not present

## 2021-05-25 DIAGNOSIS — I499 Cardiac arrhythmia, unspecified: Secondary | ICD-10-CM | POA: Diagnosis not present

## 2021-05-25 DIAGNOSIS — Z20822 Contact with and (suspected) exposure to covid-19: Secondary | ICD-10-CM | POA: Diagnosis not present

## 2021-05-25 DIAGNOSIS — Z809 Family history of malignant neoplasm, unspecified: Secondary | ICD-10-CM | POA: Diagnosis not present

## 2021-05-25 DIAGNOSIS — M1990 Unspecified osteoarthritis, unspecified site: Secondary | ICD-10-CM | POA: Diagnosis not present

## 2021-05-25 MED ORDER — APIXABAN 5 MG PO TABS: 5.0000 mg | ORAL_TABLET | Freq: Two times a day (BID) | ORAL | 1 refills | Status: DC

## 2021-05-25 MED ORDER — METOPROLOL SUCCINATE ER 50 MG PO TB24: 50.0000 mg | ORAL_TABLET | Freq: Every day | ORAL | 1 refills | Status: DC

## 2021-05-25 NOTE — Progress Notes (Signed)
Results discussed with patient during visit. Referral placed to Cardiology.

## 2021-05-25 NOTE — Telephone Encounter (Signed)
[employer] NP, Tobey Bride called in for pt during annual home visit. Reports that pt is in A-fib but asymptomatic. Pulse irregular ranging from 120-135. BP was norm 120s/80s. HR taken apical and radial. He states pt has had A-fib before and went in for cardioversion but right before procedure was going to be performed, pt had self cardioverted self. Pt is unsure when she had this episode. Carmell Austria states that pt is completely asymptomatic. Asked if he felt if 911 call was necessary and he stated no because pt wasn't experiencing symptoms. He states he prefers for her to be seen in the office today or tomorrow if possible. Appt was made for today at 1600 with Clydie Braun, NP. Care advice given. Made sure that pt has someone close by that will be with her in case she did experience symptoms and to call 911 at that point or go to nearest ED. Pt and him verbalized understanding.   Reason for Disposition  [1] Skipped or extra beat(s) AND [2] occurs 4 or more times per minute  Answer Assessment - Initial Assessment Questions 1. DESCRIPTION: "Please describe your heart rate or heartbeat that you are having" (e.g., fast/slow, regular/irregular, skipped or extra beats, "palpitations")     Irregular 120-135 2. ONSET: "When did it start?" (Minutes, hours or days)      unsure 3. DURATION: "How long does it last" (e.g., seconds, minutes, hours)     35 mins 4. PATTERN "Does it come and go, or has it been constant since it started?"  "Does it get worse with exertion?"   "Are you feeling it now?"     constant 5. TAP: "Using your hand, can you tap out what you are feeling on a chair or table in front of you, so that I can hear?" (Note: not all patients can do this)       Irregular heartrate 6. HEART RATE: "Can you tell me your heart rate?" "How many beats in 15 seconds?"  (Note: not all patients can do this)       135 7. RECURRENT SYMPTOM: "Have you ever had this before?" If Yes, ask: "When was the last time?" and "What  happened that time?"      Yes unsure when that was 8. CAUSE: "What do you think is causing the palpitations?"     Hx of Afib 9. CARDIAC HISTORY: "Do you have any history of heart disease?" (e.g., heart attack, angina, bypass surgery, angioplasty, arrhythmia)      arrhythmia 10. OTHER SYMPTOMS: "Do you have any other symptoms?" (e.g., dizziness, chest pain, sweating, difficulty breathing)       No symptoms 11. PREGNANCY: "Is there any chance you are pregnant?" "When was your last menstrual period?"       NO  Protocols used: Heart Rate and Heartbeat Questions-[facility]

## 2021-05-25 NOTE — Telephone Encounter (Signed)
Please see notes below.  Appointment scheduled for today.

## 2021-05-25 NOTE — Progress Notes (Signed)
BP 127/73   Pulse (!) 123   Temp (!) 97.4 F (36.3 C) (Oral)   Ht 5\' 2"  (1.575 m)   Wt (!) 306 lb (138.8 kg)   SpO2 97%   BMI 55.97 kg/m    Subjective:    Patient ID: [name], female    DOB: 03-28-1951, 70 y.o.   MRN: [id number]  HPI: [name] is a 70 y.o. female  Chief Complaint  Patient presents with   Irregular Heart Beat    Pt states that the home nurse that came out this morning stated that her heart was in A Fib and had an irregular heartbeat   AFIB Patient presents to clinic today with complaints of AFIB.  Patient had a nurse come out to her home today and the nurse told her that her HR is 135 and that she was in AFIB. Patient denies any SOB, palpitations, or chest pain.   Relevant past medical, surgical, family and social history reviewed and updated as indicated. Interim medical history since our last visit reviewed. Allergies and medications reviewed and updated.  Review of Systems  Respiratory:  Negative for shortness of breath.   Cardiovascular:  Negative for chest pain and palpitations.   Per HPI unless specifically indicated above     Objective:    BP 127/73   Pulse (!) 123   Temp (!) 97.4 F (36.3 C) (Oral)   Ht 5\' 2"  (1.575 m)   Wt (!) 306 lb (138.8 kg)   SpO2 97%   BMI 55.97 kg/m   Wt Readings from Last 3 Encounters:  05/25/21 (!) 306 lb (138.8 kg)  05/03/21 300 lb (136.1 kg)  04/10/21 300 lb (136.1 kg)    Physical Exam Vitals and nursing note reviewed.  Constitutional:      General: She is not in acute distress.    Appearance: Normal appearance. She is obese. She is not ill-appearing, toxic-appearing or diaphoretic.  HENT:     Head: Normocephalic.     Right Ear: External ear normal.     Left Ear: External ear normal.     Nose: Nose normal.     Mouth/Throat:     Mouth: Mucous membranes are moist.     Pharynx: Oropharynx is clear.  Eyes:     General:        Right eye: No discharge.        Left eye: No discharge.      Extraocular Movements: Extraocular movements intact.     Conjunctiva/sclera: Conjunctivae normal.     Pupils: Pupils are equal, round, and reactive to light.  Cardiovascular:     Rate and Rhythm: Tachycardia present. Rhythm irregular.     Heart sounds: No murmur heard. Pulmonary:     Effort: Pulmonary effort is normal. No respiratory distress.     Breath sounds: Normal breath sounds. No wheezing or rales.  Musculoskeletal:     Cervical back: Normal range of motion and neck supple.  Skin:    General: Skin is warm and dry.     Capillary Refill: Capillary refill takes less than 2 seconds.  Neurological:     General: No focal deficit present.     Mental Status: She is alert and oriented to person, place, and time. Mental status is at baseline.  Psychiatric:        Mood and Affect: Mood normal.        Behavior: Behavior normal.        Thought  Content: Thought content normal.        Judgment: Judgment normal.    Results for orders placed or performed in visit on 02/08/21  Urine Culture   Specimen: Urine   UR  Result Value Ref Range   Urine Culture, Routine Final report    Organism ID, Bacteria Comment   CBC w/Diff  Result Value Ref Range   WBC 3.9 3.4 - 10.8 x10E3/uL   RBC 4.08 3.77 - 5.28 x10E6/uL   Hemoglobin 12.9 11.1 - 15.9 g/dL   Hematocrit 23.7 62.8 - 46.6 %   MCV 93 79 - 97 fL   MCH 31.6 26.6 - 33.0 pg   MCHC 33.9 31.5 - 35.7 g/dL   RDW 31.5 17.6 - 16.0 %   Platelets 170 150 - 450 x10E3/uL   Neutrophils 60 Not Estab. %   Lymphs 24 Not Estab. %   Monocytes 10 Not Estab. %   Eos 5 Not Estab. %   Basos 1 Not Estab. %   Neutrophils Absolute 2.3 1.4 - 7.0 x10E3/uL   Lymphocytes Absolute 0.9 0.7 - 3.1 x10E3/uL   Monocytes Absolute 0.4 0.1 - 0.9 x10E3/uL   EOS (ABSOLUTE) 0.2 0.0 - 0.4 x10E3/uL   Basophils Absolute 0.0 0.0 - 0.2 x10E3/uL   Immature Granulocytes 0 Not Estab. %   Immature Grans (Abs) 0.0 0.0 - 0.1 x10E3/uL      Assessment & Plan:   Problem List Items  Addressed This Visit       Cardiovascular and Mediastinum   Atrial fibrillation (HCC) - Primary    Chronic. Patient's HR is elevated to the 120-130s. Asymptomatic.  Not currently taking any medications.  EKG not definitive for AFB however it is abnormal with possible Flutter. Will resume Metoprolol 50mg  daily and start Eliquis.  Referral placed for Cardiology for further evaluation and workup.      Relevant Medications   apixaban (ELIQUIS) 5 MG TABS tablet   metoprolol succinate (TOPROL-XL) 50 MG 24 hr tablet   Other Relevant Orders   EKG 12-Lead (Completed)   Ambulatory referral to Cardiology     Follow up plan: Return in about 2 months (around 07/25/2021) for HTN, HLD, DM2 FU.   A total of 30 minutes were spent on this encounter today.  When total time is documented, this includes both the face-to-face and non-face-to-face time personally spent before, during and after the visit on the date of the encounter reviewing EKG, discussing medications and the importance of following up with Cardiology.

## 2021-05-25 NOTE — Assessment & Plan Note (Signed)
Chronic. Patient's HR is elevated to the 120-130s. Asymptomatic.  Not currently taking any medications.  EKG not definitive for AFB however it is abnormal with possible Flutter. Will resume Metoprolol 50mg  daily and start Eliquis.  Referral placed for Cardiology for further evaluation and workup.

## 2021-05-27 ENCOUNTER — Emergency Department: Payer: Medicare HMO

## 2021-05-27 ENCOUNTER — Emergency Department
Admission: EM | Admit: 2021-05-27 | Discharge: 2021-05-27 | Disposition: A | Discharge status: Home / Self Care | Payer: Medicare HMO | Attending: Emergency Medicine | Admitting: Emergency Medicine

## 2021-05-27 ENCOUNTER — Other Ambulatory Visit: Payer: Self-pay

## 2021-05-27 DIAGNOSIS — R079 Chest pain, unspecified: Secondary | ICD-10-CM | POA: Diagnosis not present

## 2021-05-27 DIAGNOSIS — I517 Cardiomegaly: Secondary | ICD-10-CM | POA: Diagnosis not present

## 2021-05-27 DIAGNOSIS — Z8616 Personal history of COVID-19: Secondary | ICD-10-CM | POA: Insufficient documentation

## 2021-05-27 DIAGNOSIS — Z7901 Long term (current) use of anticoagulants: Secondary | ICD-10-CM | POA: Insufficient documentation

## 2021-05-27 DIAGNOSIS — I110 Hypertensive heart disease with heart failure: Secondary | ICD-10-CM | POA: Insufficient documentation

## 2021-05-27 DIAGNOSIS — R072 Precordial pain: Secondary | ICD-10-CM | POA: Diagnosis present

## 2021-05-27 DIAGNOSIS — I509 Heart failure, unspecified: Secondary | ICD-10-CM | POA: Diagnosis not present

## 2021-05-27 DIAGNOSIS — R0789 Other chest pain: Secondary | ICD-10-CM | POA: Diagnosis not present

## 2021-05-27 DIAGNOSIS — I4891 Unspecified atrial fibrillation: Secondary | ICD-10-CM | POA: Insufficient documentation

## 2021-05-27 DIAGNOSIS — Z96652 Presence of left artificial knee joint: Secondary | ICD-10-CM | POA: Diagnosis not present

## 2021-05-27 DIAGNOSIS — Z79899 Other long term (current) drug therapy: Secondary | ICD-10-CM | POA: Insufficient documentation

## 2021-05-27 DIAGNOSIS — R110 Nausea: Secondary | ICD-10-CM | POA: Insufficient documentation

## 2021-05-27 DIAGNOSIS — K449 Diaphragmatic hernia without obstruction or gangrene: Secondary | ICD-10-CM | POA: Diagnosis not present

## 2021-05-27 LAB — BASIC METABOLIC PANEL
Anion gap: 6 (ref 5–15)
BUN: 17 mg/dL (ref 8–23)
CO2: 28 mmol/L (ref 22–32)
Calcium: 9 mg/dL (ref 8.9–10.3)
Chloride: 103 mmol/L (ref 98–111)
Creatinine, Ser: 0.95 mg/dL (ref 0.44–1.00)
GFR, Estimated: >60 mL/min (ref >60)
Glucose, Bld: 123 mg/dL — ABNORMAL HIGH (ref 70–99)
Potassium: 4 mmol/L (ref 3.5–5.1)
Sodium: 137 mmol/L (ref 135–145)

## 2021-05-27 LAB — CBC
HCT: 41.3 % (ref 36.0–46.0)
Hemoglobin: 13.4 g/dL (ref 12.0–15.0)
MCH: 31.5 pg (ref 26.0–34.0)
MCHC: 32.4 g/dL (ref 30.0–36.0)
MCV: 97.2 fL (ref 80.0–100.0)
Platelets: 186 10*3/uL (ref 150–400)
RBC: 4.25 MIL/uL (ref 3.87–5.11)
RDW: 13 % (ref 11.5–15.5)
WBC: 6.9 10*3/uL (ref 4.0–10.5)
nRBC: 0 % (ref 0.0–0.2)

## 2021-05-27 LAB — TROPONIN I (HIGH SENSITIVITY)
Troponin I (High Sensitivity): 6 ng/L (ref <18)
Troponin I (High Sensitivity): 5 ng/L (ref <18)

## 2021-05-27 MED ORDER — IOHEXOL 350 MG/ML SOLN
80.0000 mL | Freq: Once | INTRAVENOUS | Status: AC | PRN
  Administered 2021-05-27: 80 mL via INTRAVENOUS
  Filled 2021-05-27: qty 80

## 2021-05-27 NOTE — ED Notes (Signed)
Rainbow sent to the lab.  

## 2021-05-27 NOTE — ED Notes (Signed)
Patient stable and discharged with all personal belongings and AVS. AVS and discharge instructions reviewed with patient and opportunity for questions provided.   

## 2021-05-27 NOTE — ED Provider Notes (Signed)
Emergency Medicine Provider Triage Evaluation Note  [name] , a 70 y.o. female  was evaluated in triage.  Pt complains of midsternal and left upper chest pain that started at approximately 2 AM today.  Patient denies radiation.  She complains of increased pain with deep inspiration.  She denies any cough, congestion, fever, chills, nausea or vomiting.  No known exposure to COVID.  Patient is non-smoker.  Review of Systems  Positive: Chest pain.  History of atrial fib. Negative: No nausea vomiting or diarrhea.  No fever, chills.  Physical Exam  BP (!) 145/91 (BP Location: Left Wrist)   Pulse (!) 144 Comment: pt states hx of afib  Temp 97.9 F (36.6 C) (Oral)   Resp 20   Ht 5\' 2"  (1.575 m)   Wt 136.1 kg   SpO2 97%   BMI 54.87 kg/m  Gen:   Awake, no distress, able to answer questions but is constantly moaning. Resp:  Normal effort.  No rales or rhonchi noted.  No cough during exam. MSK:   Moves extremities without difficulty no edema noted lower extremities. Other:    Medical Decision Making  Medically screening exam initiated at 7:35 AM.  Appropriate orders placed.  was informed that the remainder of the evaluation will be completed by another provider, this initial triage assessment does not replace that evaluation, and the importance of remaining in the ED until their evaluation is complete.     [name], PA-C 05/27/21 0744    13/05/22, MD 05/27/21 [phone]

## 2021-05-27 NOTE — ED Notes (Signed)
Patient appears uncomfortable. C/o chest pain.

## 2021-05-27 NOTE — ED Triage Notes (Signed)
Pt arrived to ed from visiting daughter on another unit in hospital. Pt reports throbbing CP starting around 0200 this morning. Pt states not radiating anywhere but increases with deep inhalation, hx of afib. EKG rate 103. Pt reports taking she normally takes eliquis and metoprolol but has not taken it this morning. States "I haven't had time".

## 2021-05-27 NOTE — ED Notes (Signed)
See triage note  presents with some c/p discomfort this am  states she had pounding in chest  hx of afib

## 2021-05-27 NOTE — ED Provider Notes (Signed)
[facility] Emergency Department Provider Note   ____________________________________________   Event Date/Time   First MD Initiated Contact with Patient 05/27/21 1133     (approximate)  I have reviewed the triage vital signs and the nursing notes.   HISTORY  Chief Complaint Chest Pain    HPI [name] is a 70 y.o. female who presents for substernal chest pain  LOCATION: Chest DURATION: Began this morning TIMING: Stable since onset SEVERITY: States 10/10 QUALITY: Sharp/burning pain CONTEXT: Patient states that she when she woke this morning she discharge scribes sharp/burning pain in the anterior substernal chest that has remained stable since onset.  Patient states she does have a history of A. fib and has not taken her Eliquis or metoprolol this morning. MODIFYING FACTORS: States worse when taking a deep breath and denies any relieving factors ASSOCIATED SYMPTOMS: Nausea   Per medical record review, patient has history of CHF, atrial fibrillation          Past Medical History:  Diagnosis Date   History of shingles    Knee pain    Migraines     Patient Active Problem List   Diagnosis Date Noted   Atrial fibrillation (HCC) 08/24/2020   Pneumonia due to COVID-19 virus 08/24/2020   (HFpEF) heart failure with preserved ejection fraction (HCC) 08/24/2020   Viral URI 08/03/2020   Postherpetic neuralgia 03/29/2020   Grief 03/29/2020   Cough 03/29/2020   Encounter for screening colonoscopy    Elevated blood pressure reading 01/19/2020   Osteoarthritis of right knee 12/21/2019   Morbid obesity due to excess calories (HCC) 08/13/2015   Chronic pain of right knee 08/13/2015    Past Surgical History:  Procedure Laterality Date   COLONOSCOPY WITH PROPOFOL N/A 02/19/2020   Procedure: COLONOSCOPY WITH PROPOFOL;  Surgeon: Pasty Spillers, MD;  Location: [facility] ENDOSCOPY;  Service: Endoscopy;  Laterality: N/A;   REPLACEMENT TOTAL KNEE      left knee   TUBAL LIGATION      Prior to Admission medications   Medication Sig Start Date End Date Taking? Authorizing Provider  Acetaminophen (TYLENOL 8 HOUR PO) Take by mouth as needed.    [provider]  albuterol (VENTOLIN HFA) 108 (90 Base) MCG/ACT inhaler Inhale 1-2 puffs into the lungs every 6 (six) hours as needed for wheezing or shortness of breath. 01/25/21 01/25/22  Larae Grooms, NP  apixaban (ELIQUIS) 5 MG TABS tablet Take 1 tablet (5 mg total) by mouth 2 (two) times daily. 05/25/21   Larae Grooms, NP  gabapentin (NEURONTIN) 600 MG tablet Take 1 tablet (600 mg total) by mouth 3 (three) times daily as needed. 04/10/21   Larae Grooms, NP  hydrOXYzine (ATARAX/VISTARIL) 25 MG tablet Take 1 tablet (25 mg total) by mouth 3 (three) times daily as needed for anxiety or itching. Patient not taking: Reported on 05/25/2021 08/03/20   Caro Laroche, DO  metoprolol succinate (TOPROL-XL) 50 MG 24 hr tablet Take 1 tablet (50 mg total) by mouth daily. 05/25/21   Larae Grooms, NP  metroNIDAZOLE (METROGEL) 1 % gel Apply topically daily. Patient not taking: Reported on 05/25/2021 05/03/21   Larae Grooms, NP  ondansetron (ZOFRAN) 4 MG tablet Take 1 tablet (4 mg total) by mouth every 8 (eight) hours as needed for nausea or vomiting. Patient not taking: Reported on 05/25/2021 08/10/20   Caro Laroche, DO  Spacer/Aero-Holding Chambers [facility] DIAMOND) MISC by Does not apply route. 08/17/20   [provider]  spironolactone (ALDACTONE) 25 MG tablet Take by mouth. Patient not taking: Reported on 05/25/2021 08/17/20   [provider]  venlafaxine XR (EFFEXOR-XR) 75 MG 24 hr capsule TAKE 1 CAPSULE BY MOUTH DAILY WITH BREAKFAST. Patient not taking: Reported on 05/25/2021 08/30/20   Caro Laroche, DO    Allergies Patient has no known allergies.  Family History  Problem Relation Age of Onset   Heart disease Mother    Hypertension Mother    Cancer  Father    Cancer Brother     Social History Social History   Tobacco Use   Smoking status: Never   Smokeless tobacco: Never  Vaping Use   Vaping Use: Never used  Substance Use Topics   Alcohol use: No   Drug use: No    Review of Systems Constitutional: No fever/chills Eyes: No visual changes. ENT: No sore throat. Cardiovascular: Endorses chest pain. Respiratory: Endorses shortness of breath. Gastrointestinal: No abdominal pain.  Endorses nausea, no vomiting.  No diarrhea. Genitourinary: Negative for dysuria. Musculoskeletal: Negative for acute arthralgias Skin: Negative for rash. Neurological: Negative for headaches, weakness/numbness/paresthesias in any extremity Psychiatric: Negative for suicidal ideation/homicidal ideation   ____________________________________________   PHYSICAL EXAM:  VITAL SIGNS: ED Triage Vitals  Enc Vitals Group     BP 05/27/21 0648 (!) 145/91     Pulse Rate 05/27/21 0648 (!) 144     Resp 05/27/21 0648 20     Temp 05/27/21 0648 97.9 F (36.6 C)     Temp Source 05/27/21 0648 Oral     SpO2 05/27/21 0648 97 %     Weight 05/27/21 0650 300 lb (136.1 kg)     Height 05/27/21 0650 5\' 2"  (1.575 m)     Head Circumference --      Peak Flow --      Pain Score 05/27/21 0730 10     Pain Loc --      Pain Edu? --      Excl. in GC? --    Constitutional: Alert and oriented. Well appearing and in no acute distress. Eyes: Conjunctivae are normal. PERRL. Head: Atraumatic. Nose: No congestion/rhinnorhea. Mouth/Throat: Mucous membranes are moist. Neck: No stridor Cardiovascular: Grossly normal heart sounds.  Good peripheral circulation. Respiratory: Normal respiratory effort.  No retractions. Gastrointestinal: Soft and nontender. No distention. Musculoskeletal: No obvious deformities Neurologic:  Normal speech and language. No gross focal neurologic deficits are appreciated. Skin:  Skin is warm and dry. No rash noted. Psychiatric: Mood and affect  are normal. Speech and behavior are normal.  ____________________________________________   LABS (all labs ordered are listed, but only abnormal results are displayed)  Labs Reviewed  BASIC METABOLIC PANEL - Abnormal; Notable for the following components:      Result Value   Glucose, Bld 123 (*)    All other components within normal limits  CBC  TROPONIN I (HIGH SENSITIVITY)  TROPONIN I (HIGH SENSITIVITY)   ____________________________________________  EKG  ED ECG REPORT I, 13/05/22, the attending physician, personally viewed and interpreted this ECG.  Date: 05/27/2021 EKG Time: 0644 Rate: 103 Rhythm: Atrial fibrillation QRS Axis: normal Intervals: normal ST/T Wave abnormalities: normal Narrative Interpretation: Atrial fibrillation.  No evidence of acute ischemia  ____________________________________________  RADIOLOGY  ED MD interpretation: 2 view chest x-ray shows mild cardiomegaly and right lung scarring without any evidence of active disease  Official radiology report(s): DG Chest 2 View  Result Date: 05/27/2021 CLINICAL DATA:  Acute onset chest pain this morning. EXAM: CHEST -  2 VIEW COMPARISON:  None. FINDINGS: Mild cardiomegaly noted. Linear opacity in right mid lung is consistent with mild scarring. No evidence of pulmonary infiltrate or edema. No evidence of pleural effusion. IMPRESSION: Mild cardiomegaly and right lung scarring. No active lung disease. Electronically Signed   By: Danae Orleans M.D.   On: 05/27/2021 08:50   CT Angio Chest PE W/Cm &/Or Wo Cm  Result Date: 05/27/2021 CLINICAL DATA:  70 year old female with a history of possible pulmonary embolism EXAM: CT ANGIOGRAPHY CHEST WITH CONTRAST TECHNIQUE: Multidetector CT imaging of the chest was performed using the standard protocol during bolus administration of intravenous contrast. Multiplanar CT image reconstructions and MIPs were obtained to evaluate the vascular anatomy. CONTRAST:  64mL  OMNIPAQUE IOHEXOL 350 MG/ML SOLN COMPARISON:  None. FINDINGS: Cardiovascular: Heart: Cardiomegaly. Trace pericardial fluid/thickening. Calcifications of left main, left anterior descending, circumflex coronary arteries. Aorta: Unremarkable course, caliber, contour of the thoracic aorta. No atherosclerotic changes of the thoracic aorta/arch. Branch vessels are patent. Pulmonary arteries: No central, lobar, segmental, or proximal subsegmental filling defects. Mediastinum/Nodes: Small lymph nodes of the mediastinum. Unremarkable appearance of the thoracic esophagus. Enlarged thyroid, poorly characterized by CT. Lungs/Pleura: Geographic ground-glass of the lungs, without confluent airspace disease. No pleural effusion. No pneumothorax. Upper Abdomen: Hiatal hernia. No acute finding of the upper abdomen. Musculoskeletal: Degenerative changes of the spine. No bony canal narrowing. No acute displaced fracture. Review of the MIP images confirms the above findings. IMPRESSION: Negative for pulmonary embolism, with no acute finding to account for chest pain. Aortic atherosclerosis, coronary artery disease, cardiomegaly. Aortic Atherosclerosis (ICD10-I70.0). Geographic ground-glass opacity of the lungs, most commonly seen with small airway disease. Electronically Signed   By: Gilmer Mor D.O.   On: 05/27/2021 14:47    ____________________________________________   PROCEDURES  Procedure(s) performed (including Critical Care):  .1-3 Lead EKG Interpretation Performed by: Merwyn Katos, MD Authorized by: Merwyn Katos, MD     Interpretation: normal     ECG rate:  92   ECG rate assessment: normal     Rhythm: sinus rhythm     Ectopy: none     Conduction: normal     ____________________________________________   INITIAL IMPRESSION / ASSESSMENT AND PLAN / ED COURSE  As part of my medical decision making, I reviewed the following data within the electronic medical record, if available:  Nursing notes  reviewed and incorporated, Labs reviewed, EKG interpreted, Old chart reviewed, Radiograph reviewed and Notes from prior ED visits reviewed and incorporated        Workup: ECG, CXR, CBC, BMP, Troponin Findings: ECG: No overt evidence of STEMI. No evidence of Brugadas sign, delta wave, epsilon wave, significantly prolonged QTc, or malignant arrhythmia HS Troponin: Negative x1 Other Labs unremarkable for emergent problems. CXR: Without PTX, PNA, or widened mediastinum Last Stress Test: Never Last Heart Catheterization: Never HEART Score: 3  Given History, Exam, and Workup I have low suspicion for ACS, Pneumothorax, Pneumonia, Pulmonary Embolus, Tamponade, Aortic Dissection or other emergent problem as a cause for this presentation.   Reassesment: Prior to discharge patients pain was controlled and they were well appearing.  Disposition:  Discharge. Strict return precautions discussed with patient with full understanding. Advised patient to follow up promptly with primary care provider      ____________________________________________   FINAL CLINICAL IMPRESSION(S) / ED DIAGNOSES  Final diagnoses:  Atypical chest pain     ED Discharge Orders     None        Note:  This document was prepared using Dragon voice recognition software and may include unintentional dictation errors.    Merwyn Katos, MD 05/27/21 [phone]

## 2021-05-30 ENCOUNTER — Telehealth: Payer: Self-pay | Admitting: Nurse Practitioner

## 2021-05-30 NOTE — Telephone Encounter (Signed)
Copied from CRM [id number]. Topic: General - Other >> May 30, 2021 10:21 AM Jaquita Rector A wrote: Reason for CRM: Patient called in asking to speak to [facility] needing to discuss something pertaining to her appointment with the heart Dr can be reached at Ph# [phone]

## 2021-06-13 NOTE — Progress Notes (Signed)
Electrophysiology Office Note:    Date:  06/14/2021   ID:  [name], DOB April 26, 1951, MRN [id number]  PCP:  Larae Grooms, NP  [facility] Cardiologist:  None  [facility] Electrophysiologist:  Lanier Prude, MD   Referring MD: Larae Grooms, NP   Chief Complaint: Atrial fibrillation  History of Present Illness:    [name] is a 70 y.o. female who presents for an evaluation of atrial fibrillation at the request of [name], NP. Their medical history includes abnormal ECG and chronic diastolic heart failure.  The patient was seen by [name] on May 25, 2021.  According to the note a nurse came out to her house and noted elevated heart rate in the 130s.  She was told by the nurse that she was in atrial fibrillation.  She was seen in the emergency department on November 5 with substernal chest pain She is with her family today in clinic.  She confirms that she cannot tell when she is in or out of rhythm.  She has intermittent shortness of breath and dizziness but they do not seem to correlate to when she is out of rhythm.  For example today she does not feel lightheaded or short of breath but she is clearly in A. fib with a rapid ventricular rate.    Past Medical History:  Diagnosis Date   History of shingles    Knee pain    Migraines     Past Surgical History:  Procedure Laterality Date   COLONOSCOPY WITH PROPOFOL N/A 02/19/2020   Procedure: COLONOSCOPY WITH PROPOFOL;  Surgeon: Pasty Spillers, MD;  Location: [facility] ENDOSCOPY;  Service: Endoscopy;  Laterality: N/A;   REPLACEMENT TOTAL KNEE     left knee   TUBAL LIGATION      Current Medications: Current Meds  Medication Sig   Acetaminophen (TYLENOL 8 HOUR PO) Take by mouth as needed.   albuterol (VENTOLIN HFA) 108 (90 Base) MCG/ACT inhaler Inhale 1-2 puffs into the lungs every 6 (six) hours as needed for wheezing or shortness of breath.   apixaban (ELIQUIS) 5 MG TABS tablet  Take 1 tablet (5 mg total) by mouth 2 (two) times daily.   gabapentin (NEURONTIN) 600 MG tablet Take 1 tablet (600 mg total) by mouth 3 (three) times daily as needed.   hydrOXYzine (ATARAX/VISTARIL) 25 MG tablet Take 1 tablet (25 mg total) by mouth 3 (three) times daily as needed for anxiety or itching.   metroNIDAZOLE (METROGEL) 1 % gel Apply topically daily.   ondansetron (ZOFRAN) 4 MG tablet Take 1 tablet (4 mg total) by mouth every 8 (eight) hours as needed for nausea or vomiting.   Spacer/Aero-Holding Chambers [facility] DIAMOND) MISC by Does not apply route.   spironolactone (ALDACTONE) 25 MG tablet Take by mouth.   venlafaxine XR (EFFEXOR-XR) 75 MG 24 hr capsule TAKE 1 CAPSULE BY MOUTH DAILY WITH BREAKFAST.   [DISCONTINUED] metoprolol succinate (TOPROL-XL) 50 MG 24 hr tablet Take 1 tablet (50 mg total) by mouth daily.     Allergies:   Patient has no known allergies.   Social History   Socioeconomic History   Marital status: Widowed    Spouse name: Not on file   Number of children: Not on file   Years of education: Not on file   Highest education level: Not on file  Occupational History   Occupation: retired  Tobacco Use   Smoking status: Never   Smokeless tobacco: Never  Vaping Use  Vaping Use: Never used  Substance and Sexual Activity   Alcohol use: No   Drug use: No   Sexual activity: Not Currently  Other Topics Concern   Not on file  Social History Narrative   Not on file   Social Determinants of Health   Financial Resource Strain: Low Risk    Difficulty of Paying Living Expenses: Not hard at all  Food Insecurity: No Food Insecurity   Worried About Programme researcher, broadcasting/film/video in the Last Year: Never true   Ran Out of Food in the Last Year: Never true  Transportation Needs: No Transportation Needs   Lack of Transportation (Medical): No   Lack of Transportation (Non-Medical): No  Physical Activity: Inactive   Days of Exercise per Week: 0 days   Minutes of Exercise  per Session: 0 min  Stress: No Stress Concern Present   Feeling of Stress : Not at all  Social Connections: Socially Isolated   Frequency of Communication with Friends and Family: More than three times a week   Frequency of Social Gatherings with Friends and Family: More than three times a week   Attends Religious Services: Never   Database administrator or Organizations: No   Attends Banker Meetings: Never   Marital Status: Widowed     Family History: The patient's family history includes Cancer in her brother and father; Heart disease in her mother; Hypertension in her mother.  ROS:   Please see the history of present illness.    All other systems reviewed and are negative.  EKGs/Labs/Other Studies Reviewed:    The following studies were reviewed today:  May 25, 2021 EKG shows atrial fibrillation May 27, 2021 EKG shows atrial fibrillation with a ventricular rate of 103 bpm  August 15, 2020 echo at [facility]  ventricular function normal, 60% Severely dilated left atrium Mildly dilated right atrium Mild AI Borderline pulmonary hypertension with PASP of 35 mmHg Patient's echo performed in atrial fibrillation   EKG:  The ekg ordered today demonstrates atrial fibrillation with a ventricular rate of 160 bpm.   Recent Labs: 01/19/2021: ALT 4 05/27/2021: BUN 17; Creatinine, Ser 0.95; Hemoglobin 13.4; Platelets 186; Potassium 4.0; Sodium 137  Recent Lipid Panel    Component Value Date/Time   CHOL 191 01/19/2020 0843   TRIG 126 01/19/2020 0843   HDL 49 01/19/2020 0843   LDLCALC 119 (H) 01/19/2020 0843    Physical Exam:    VS:  BP 132/72   Pulse (!) 116   Ht 5\' 2"  (1.575 m)   Wt (!) 308 lb 12.8 oz (140.1 kg)   SpO2 97%   BMI 56.48 kg/m     Wt Readings from Last 3 Encounters:  06/14/21 (!) 308 lb 12.8 oz (140.1 kg)  05/27/21 300 lb (136.1 kg)  05/25/21 (!) 306 lb (138.8 kg)     GEN: Obese, NAD HEENT: Normal NECK: No JVD; No carotid  bruits LYMPHATICS: No lymphadenopathy CARDIAC: irregularly irregular, no murmurs RESPIRATORY:  Clear to auscultation without rales, wheezing or rhonchi  ABDOMEN: Soft, non-tender, non-distended MUSCULOSKELETAL:  No edema; No deformity  SKIN: Warm and dry NEUROLOGIC:  Alert and oriented x 3 PSYCHIATRIC:  Normal affect       ASSESSMENT:    1. Atrial fibrillation, unspecified type (HCC)   2. Heart failure with preserved ejection fraction, unspecified HF chronicity (HCC)    PLAN:    In order of problems listed above:  #Persistent atrial fibrillation In A. fib today  with poorly controlled ventricular rates.  I think we should increase the metoprolol succinate to 50 mg by mouth twice daily.  At this time, given her severe left atrial enlargement, preserved left ventricular function and asymptomatic nature of the AF, rate control is indicated. She is not a candidate for ablation given her obesity. Continue eliquis for stroke ppx.  #HFpEF/Shortness of Breath I suspect her diastolic dysfunction is contributing to her dyspnea. Recommend good BP control. I do not think she is volume overloaded on exam. I suspect her weight is also contributing to her dyspnea.  Follow up with EP as needed.  Medication Adjustments/Labs and Tests Ordered: Current medicines are reviewed at length with the patient today.  Concerns regarding medicines are outlined above.  No orders of the defined types were placed in this encounter.  No orders of the defined types were placed in this encounter.    Signed, Rossie Muskrat. Lalla Brothers, MD, [facility], [facility] 06/14/2021 9:57 AM    Electrophysiology [facility]

## 2021-06-14 ENCOUNTER — Encounter: Payer: Self-pay | Admitting: Cardiology

## 2021-06-14 ENCOUNTER — Ambulatory Visit: Payer: Medicare HMO | Admitting: Cardiology

## 2021-06-14 ENCOUNTER — Other Ambulatory Visit: Payer: Self-pay

## 2021-06-14 VITALS — BP 132/72 | HR 116 | Ht 62.0 in | Wt 308.8 lb

## 2021-06-14 DIAGNOSIS — R0602 Shortness of breath: Secondary | ICD-10-CM

## 2021-06-14 DIAGNOSIS — I4819 Other persistent atrial fibrillation: Secondary | ICD-10-CM | POA: Diagnosis not present

## 2021-06-14 DIAGNOSIS — I5030 Unspecified diastolic (congestive) heart failure: Secondary | ICD-10-CM

## 2021-06-14 DIAGNOSIS — I4891 Unspecified atrial fibrillation: Secondary | ICD-10-CM

## 2021-06-14 MED ORDER — METOPROLOL SUCCINATE ER 50 MG PO TB24: 50.0000 mg | ORAL_TABLET | Freq: Two times a day (BID) | ORAL | 3 refills | Status: DC

## 2021-06-14 NOTE — Patient Instructions (Addendum)
Medication Instructions:  Your physician has recommended you make the following change in your medication:    INCREASE your metoprolol succinate 50 mg-  Take one tablet by mouth TWICE a day  Lab Work: None ordered. If you have labs (blood work) drawn today and your tests are completely normal, you will receive your results only by: MyChart Message (if you have MyChart) OR A paper copy in the mail If you have any lab test that is abnormal or we need to change your treatment, we will call you to review the results.  Testing/Procedures: None ordered.  Follow-Up: At [facility], you and your health needs are our priority.  As part of our continuing mission to provide you with exceptional heart care, we have created designated Provider Care Teams.  These Care Teams include your primary Cardiologist (physician) and Advanced Practice Providers (APPs -  Physician Assistants and Nurse Practitioners) who all work together to provide you with the care you need, when you need it.  Your next appointment:   Your physician wants you to follow-up in: as needed

## 2021-06-22 ENCOUNTER — Ambulatory Visit (INDEPENDENT_AMBULATORY_CARE_PROVIDER_SITE_OTHER): Payer: Medicare HMO

## 2021-06-22 ENCOUNTER — Telehealth: Payer: Self-pay

## 2021-06-22 ENCOUNTER — Telehealth: Payer: Medicare HMO

## 2021-06-22 DIAGNOSIS — I10 Essential (primary) hypertension: Secondary | ICD-10-CM

## 2021-06-22 DIAGNOSIS — M545 Low back pain, unspecified: Secondary | ICD-10-CM

## 2021-06-22 DIAGNOSIS — I5030 Unspecified diastolic (congestive) heart failure: Secondary | ICD-10-CM

## 2021-06-22 DIAGNOSIS — Z9181 History of falling: Secondary | ICD-10-CM

## 2021-06-22 DIAGNOSIS — F4321 Adjustment disorder with depressed mood: Secondary | ICD-10-CM

## 2021-06-22 DIAGNOSIS — Z20822 Contact with and (suspected) exposure to covid-19: Secondary | ICD-10-CM | POA: Diagnosis not present

## 2021-06-22 DIAGNOSIS — I4891 Unspecified atrial fibrillation: Secondary | ICD-10-CM

## 2021-06-22 NOTE — Patient Instructions (Signed)
Visit Information  Thank you for taking time to visit with me today. Please don't hesitate to contact me if I can be of assistance to you before our next scheduled telephone appointment.  Following are the goals we discussed today:  RNCM Clinical Goal(s):  Patient will verbalize understanding of plan for management of Atrial Fibrillation, CHF, HTN, and Chronic pain, grief and falls work with RN Case Manager and Pharmacist to address needs related to Atrial Fibrillation, CHF, HTN, and chronic pain, grief and falls and Financial constraints related to inability to pay for medications , Level of care concerns, and Mental Health Concerns  take all medications exactly as prescribed and will call provider for medication related questions attend all scheduled medical appointments: saw cardiologist on 05-27-2021, next appointment with pcp on 07-26-2021 demonstrate a decrease in Atrial Fibrillation, CHF, HTN, and Chronic pain, grief and fall exacerbations demonstrate improved adherence to prescribed treatment plan for Atrial Fibrillation, CHF, HTN, and Chronic pain, grief, and falls  demonstrate improved health management independence verbalize basic understanding of Atrial Fibrillation, CHF, HTN, and Chronic pain, grief, and fall disease process and self health management plan demonstrate understanding of rationale for each prescribed medication work with CM team pharmacist to assist with medication cost constraints and reconciliation demonstrate ongoing self health care management ability through collaboration with RN Care manager, provider, and care team.    Interventions: 1:1 collaboration with Jon Billings, regarding development and update of comprehensive plan of care as evidenced by provider attestation and co-signature Inter-disciplinary care team collaboration (see longitudinal plan of care)     A-fib:  (Status: Goal on track: YES.) Counseled on increased risk of stroke due to Afib and  benefits of anticoagulation for stroke prevention; Reviewed importance of adherence to anticoagulant exactly as prescribed, the patient states she went for a cardioversion and they told her she did not need it. She has been off of  her medications. The patient states she will do what the doctor tells her to do but right now she is not taking any medications for her heart conditions.  She further states that she can not afford the medications and would need help with options if the MD wanted her on certain medications. 04-28-2021: The patient states that she does not have to take medications for her heart health. Discussed with her the medications list and she said that she went to have the procedure but did not have to have. The patient denies any new issues with her AFIB. Will collaborate with the pcp for recommendations. 06-22-2021: The patient states she is now taking her medications and she is checking her blood pressures and heart rate at home. She states she seems to be doing well with management of her medications.  Advised patient to discuss options for medications, plan of care, possibility of wearing a hollister monitor for evaluation of heart rhythm  with provider; Counseled on avoidance of NSAIDs due to increased bleeding risk with anticoagulants; Counseled on importance of regular laboratory monitoring as prescribed; Counseled on seeking medical attention after a head injury or if there is blood in the urine/stool; Afib action plan reviewed. 06-22-2021: Was in the ER in November for irregular heart rate and atypical chest pain. The patient was released and followed up with cardiology. The patient states that she is feeling much better and denies any further chest pain;   Falls:  (Status: Goal on track: YES) Provided written and verbal education re: potential causes of falls and Fall prevention strategies Reviewed  medications and discussed potential side effects of medications such as dizziness  and frequent urination Advised patient of importance of notifying provider of falls. Education and support given. 06-22-2021: Reviewed safety with the patient. The patient denies any new concerns with safety at this time.  Assessed for signs and symptoms of orthostatic hypotension. Denies issues with orthostatic hypotension or dizziness  Assessed for falls since last encounter. Patient states last fall was 01-10-2021. The patient was using her walker but states she lost her balance and that is why she fell. The patient denies injury. 06-22-2021: The patient denies any new falls today. The patient states she is using her cane most of the time. States she wants to be careful and not fall. She feels like she is doing well.  Assessed patients knowledge of fall risk prevention secondary to previously provided education Provided patient information for fall alert systems Advised patient to discuss falls, safety in the home, concerns about safety and when she has new falls  with provider Assessed social determinant of health barriers   Heart Failure Interventions:  (Status: Goal on track: YES) Basic overview and discussion of pathophysiology of Heart Failure reviewed; Provided education on low sodium diet- the patient states she does not use salt in her diet. The patient stated compliance with a heart healthy diet. 06-22-2021: Review with the patient and the patient is mindful of dietary restrictions.  Reviewed Heart Failure Action Plan in depth and provided written copy; Assessed need for readable accurate scales in home- the patient does not have a scale but states she can get on. Reviewed the importance of daily weights with the patient due to the subtle changes that can alert the patient to exacerbations with heart failure. ; Provided education about placing scale on hard, flat surface- reviewed- patient to get a scale; Advised patient to weigh each morning after emptying bladder; Discussed importance of  daily weight and advised patient to weigh and record daily. 04-28-2021: The patient did not have any weights for the [facility]. Ask the patient to start a record of blood pressures and weights to have on hand at the next outreach. The patient states that she has the blood pressure cuff and has been taking but could not give the RNCM any numbers. 06-22-2021: The patient is taking her blood pressure and heart rate some at home. States systolic readings are 595-638'V and she is feeling better since seeing the cardiologist. Denies any chest pain today. Education and support given.  Reviewed role of diuretics in prevention of fluid overload and management of heart failure. 06-22-2021: Is not taking any diuretics at this time. Review and eduction provided.  Discussed the importance of keeping all appointments with provider, review of upcoming appointments  Provided patient with education about the role of exercise in the management of heart failure; Advised patient to discuss heart failure plan of care  with provider; Reviewed EF% with the patient and education provided on what the EF% was. Patients last EF% was 60-65% in January of 2022.   Hypertension: (Status: Goal on Track (progressing): YES.) Last practice recorded BP readings:     BP Readings from Last 3 Encounters:  06/14/21 132/72  05/27/21 130/88  05/25/21 127/73  Most recent eGFR/CrCl:       Lab Results  Component Value Date    EGFR 81 01/19/2021    No components found for: CRCL   Evaluation of current treatment plan related to hypertension self management and patient's adherence to plan as established by  provider; Provided education to patient re: stroke prevention, s/s of heart attack and stroke; Reviewed medications with patient and discussed importance of compliance. 04-28-2021: The patient is not taking any medications except Tylenol and Gabapentin. The patient states that she does not need medications for her heart. Education on the risk of  elevated blood pressures and the need to take medications as directed. 06-22-2021: The patient now endorses taking medications as directed, blood pressures have stabilized.  Provided assistance with obtaining home blood pressure monitor via assistance from [facility] and Lavell Islam, will send an inbasket message to Lavell Islam and Janalyn Shy inquiring about the patient getting a blood pressure cuff to use so she can take her blood pressures at home and record.  04-28-2021: The patient does have the blood pressure cuff that was sent to her. She states she had not taken her blood pressure today but has taken it and it was good. The patient was unable to supply any numbers to the [facility]. Reviewed with the patient to take blood pressures and write down values to have at next outreach. The patient verbalized understanding. 06-22-2021: The patient is taking blood pressures periodically. The patient denies any issues with HTN and is now compliant with medications for heart health.   Counseled on the importance of exercise goals with target of 150 minutes per week Discussed plans with patient for ongoing care management follow up and provided patient with direct contact information for care management team; Advised patient, providing education and rationale, to monitor blood pressure daily and record, calling PCP for findings outside established parameters;  Reviewed scheduled/upcoming provider appointments including: no upcoming appointments. Reminded the patient to call for changes  Advised patient to discuss medications options and effective management of elevated blood pressures with provider; Provided education on prescribed diet heart healthy diet. 06-22-2021: The patient states she is eating well and denies any issues with dietary restrictions of Heart Healthy diet ;  Discussed complications of poorly controlled blood pressure such as heart disease, stroke, circulatory complications, vision complications, kidney  impairment, sexual dysfunction;    Grief   (Status: Goal on track: YES) Evaluation of current treatment plan related to  grief , Mental Health Concerns  self-management and patient's adherence to plan as established by provider. Discussed plans with patient for ongoing care management follow up and provided patient with direct contact information for care management team Evaluation of current treatment plan related to grief process  and patient's adherence to plan as established by provider. 06-22-2021: The patient reflected on losing her son last year and her husband 7 years ago. The patient states she is managing well right now. No acute distress noted; Advised patient to call the office for changes in mood, anxiety, or depression ; Provided education to patient re: griefshare support group locally; Reviewed medications with patient and discussed compliance, the patient is not currenttly taking anything for depression or anxiety; Provided patient with grief support  educational materials related to loss of her husband almost 7 years ago and her son a year ago, the patient has a lot of stressors in her life over grief and loss. 04-28-2021: The patient is doing well. Denies any new issues related to depression or grief. She had covid and pneumonia and got over that. Feels she is a strong person and denies any new concerns with her mental health and well being. 06-22-2021: Continues to do well. No new concerns with her grief process at this time. Has good family support ; Social  Work referral for assistance with grief, but the patient declined services at this time. The patient knows the LCSW is available to assist as needed for support and education; Discussed plans with patient for ongoing care management follow up and provided patient with direct contact information for care management team; Advised patient to discuss grief and stress  with provider; Screening for signs and symptoms of depression  related to chronic disease state;  Assessed social determinant of health barriers;    Pain:  (Status: Goal on track: YES) Pain assessment performed, patient rates chronic back pain at a 7 today on a scale of 0-10. 04-28-2021: Denies pain today. States the gabapentin and Tylenol are effective for controlling her pain. 06-22-2021: States that her pain level is at a 7 today in her back. Is taking medications as directed. Is using heat applications to help with pain management.  Medications reviewed. The patient only takes Tylenol for pain relief. 06-22-2021: Takes Tylenol and Gabapentin for pain relief and denies any concerns with pain today.  Reviewed provider established plan for pain management; Discussed importance of adherence to all scheduled medical appointments; Counseled on the importance of reporting any/all new or changed pain symptoms or management strategies to pain management provider; Advised patient to report to care team affect of pain on daily activities; Discussed use of relaxation techniques and/or diversional activities to assist with pain reduction (distraction, imagery, relaxation, massage, acupressure, TENS, heat, and cold application; Reviewed with patient prescribed pharmacological and nonpharmacological pain relief strategies; Advised patient to discuss pain relief options  with provider;   Red area to chin, mouth, and ear  (Status: Condition stable. Not addressed this visit.) Evaluation of current treatment plan related to  skin irritation with redness to chin, mouth, and ear ,  self-management and patient's adherence to plan as established by provider. Discussed plans with patient for ongoing care management follow up and provided patient with direct contact information for care management team Advised patient to call the office to get an appointment with the pcp for evaluation of skin irritation to chin, mouth, and ear. States it is a "redness" to her chin, mouth and ear  that she has had x 1 month. When she applies cream it "burns". Denies any bug bites. Will collaborate with the pcp for evaluation and treatment options; Provided education to patient re: calling the office for worsening condition, questions or concerns; Advised patient to discuss skin redness to chin, mouth, and ear with provider;    Patient Goals/Self-Care Activities: Patient will self administer medications as prescribed Patient will attend all scheduled provider appointments Patient will call pharmacy for medication refills Patient will attend church or other social activities Patient will continue to perform ADL's independently Patient will continue to perform IADL's independently Patient will call provider office for new concerns or questions Patient will work with BSW to address care coordination needs and will continue to work with the clinical team to address health care and disease management related needs.        Our next appointment is by telephone on 08-23-2021 at 1015 am  Please call the care guide team at [phone] if you need to cancel or reschedule your appointment.   If you are experiencing a Mental Health or [place] or need someone to talk to, please call the Suicide and Crisis Lifeline: 988 call the [place] National Suicide Prevention Lifeline: (302)537-0902 or TTY: 873 858 1162 TTY (670)647-4869) to talk to a trained counselor call 1-800-273-TALK (toll free, 24 hour hotline)  Patient verbalizes understanding of instructions provided today and agrees to view in [place].  Noreene Larsson RN, MSN, [facility] Mobile: [phone]

## 2021-06-22 NOTE — Chronic Care Management (AMB) (Signed)
Chronic Care Management   CCM RN Visit Note  06/22/2021 Name: [name] MRN: [id number] DOB: Dec 18, 1950  Subjective: [name] is a 70 y.o. year old female who is a primary care patient of Jon Billings, NP. The care management team was consulted for assistance with disease management and care coordination needs.    Engaged with patient by telephone for follow up visit in response to provider referral for case management and/or care coordination services.   Consent to Services:  The patient was given information about Chronic Care Management services, agreed to services, and gave verbal consent prior to initiation of services.  Please see initial visit note for detailed documentation.   Patient agreed to services and verbal consent obtained.   Assessment: Review of patient past medical history, allergies, medications, health status, including review of consultants reports, laboratory and other test data, was performed as part of comprehensive evaluation and provision of chronic care management services.   SDOH (Social Determinants of Health) assessments and interventions performed:    CCM Care Plan  No Known Allergies  Outpatient Encounter Medications as of 06/22/2021  Medication Sig   metoprolol succinate (TOPROL-XL) 50 MG 24 hr tablet Take 1 tablet (50 mg total) by mouth in the morning and at bedtime. Take with or immediately following a meal.   Acetaminophen (TYLENOL 8 HOUR PO) Take by mouth as needed.   albuterol (VENTOLIN HFA) 108 (90 Base) MCG/ACT inhaler Inhale 1-2 puffs into the lungs every 6 (six) hours as needed for wheezing or shortness of breath.   apixaban (ELIQUIS) 5 MG TABS tablet Take 1 tablet (5 mg total) by mouth 2 (two) times daily.   gabapentin (NEURONTIN) 600 MG tablet Take 1 tablet (600 mg total) by mouth 3 (three) times daily as needed.   hydrOXYzine (ATARAX/VISTARIL) 25 MG tablet Take 1 tablet (25 mg total) by mouth 3 (three) times daily as needed  for anxiety or itching.   metroNIDAZOLE (METROGEL) 1 % gel Apply topically daily.   ondansetron (ZOFRAN) 4 MG tablet Take 1 tablet (4 mg total) by mouth every 8 (eight) hours as needed for nausea or vomiting.   Spacer/Aero-Holding Chambers ([place]) MISC by Does not apply route.   spironolactone (ALDACTONE) 25 MG tablet Take by mouth.   venlafaxine XR (EFFEXOR-XR) 75 MG 24 hr capsule TAKE 1 CAPSULE BY MOUTH DAILY WITH BREAKFAST.   No facility-administered encounter medications on file as of 06/22/2021.    Patient Active Problem List   Diagnosis Date Noted   Atrial fibrillation ([place]) 08/24/2020   Pneumonia due to COVID-19 virus 08/24/2020   (HFpEF) heart failure with preserved ejection fraction ([place]) 08/24/2020   Viral URI 08/03/2020   Postherpetic neuralgia 03/29/2020   Grief 03/29/2020   Cough 03/29/2020   Encounter for screening colonoscopy    Elevated blood pressure reading 01/19/2020   Osteoarthritis of right knee 12/21/2019   Morbid obesity due to excess calories ([place]) 08/13/2015   Chronic pain of right knee 08/13/2015    Conditions to be addressed/monitored:Atrial Fibrillation, CHF, HTN, and chronic pain, falls prevention and safety and grief   Care Plan : RNCM: Management of Chronic Diseases: HF, AFIB, HTN, Chronic pain, grief and frequent falls  Updates made by Vanita Ingles, RN since 06/22/2021 12:00 AM     Problem: RNCM: Management of HF, AFIB, HTN, chronic pain, grief, and frequent falls   Priority: High     Long-Range Goal: RNCM: Effective management of HF, AFIB, HTN, Chronic pain, grief,  and falls prevention   Start Date: 01/27/2021  Expected End Date: 01/27/2022  Recent Progress: On track  Priority: High  Note:   Current Barriers:  Knowledge Deficits related to plan of care for management of Atrial Fibrillation, CHF, HTN, and Chronic pain, grief and falls  Care Coordination needs related to Level of care concerns, Medication procurement, and Mental  Health Concerns  in a patient with Atrial Fibrillation, CHF, HTN, and chronic pain, grief, falls  Chronic Disease Management support and education needs related to Atrial Fibrillation, CHF, HTN, and chronic pain, grief, falls Financial Constraints.  Difficulty obtaining medications  RNCM Clinical Goal(s):  Patient will verbalize understanding of plan for management of Atrial Fibrillation, CHF, HTN, and Chronic pain, grief and falls work with RN Case Manager and Pharmacist to address needs related to Atrial Fibrillation, CHF, HTN, and chronic pain, grief and falls and Financial constraints related to inability to pay for medications , Level of care concerns, and Mental Health Concerns  take all medications exactly as prescribed and will call provider for medication related questions attend all scheduled medical appointments: saw cardiologist on 05-27-2021, next appointment with pcp on 07-26-2021 demonstrate a decrease in Atrial Fibrillation, CHF, HTN, and Chronic pain, grief and fall exacerbations demonstrate improved adherence to prescribed treatment plan for Atrial Fibrillation, CHF, HTN, and Chronic pain, grief, and falls  demonstrate improved health management independence verbalize basic understanding of Atrial Fibrillation, CHF, HTN, and Chronic pain, grief, and fall disease process and self health management plan demonstrate understanding of rationale for each prescribed medication work with CM team pharmacist to assist with medication cost constraints and reconciliation demonstrate ongoing self health care management ability through collaboration with RN Care manager, provider, and care team.   Interventions: 1:1 collaboration with Jon Billings, regarding development and update of comprehensive plan of care as evidenced by provider attestation and co-signature Inter-disciplinary care team collaboration (see longitudinal plan of care)   A-fib:  (Status: Goal on track: YES.) Counseled  on increased risk of stroke due to Afib and benefits of anticoagulation for stroke prevention; Reviewed importance of adherence to anticoagulant exactly as prescribed, the patient states she went for a cardioversion and they told her she did not need it. She has been off of  her medications. The patient states she will do what the doctor tells her to do but right now she is not taking any medications for her heart conditions.  She further states that she can not afford the medications and would need help with options if the MD wanted her on certain medications. 04-28-2021: The patient states that she does not have to take medications for her heart health. Discussed with her the medications list and she said that she went to have the procedure but did not have to have. The patient denies any new issues with her AFIB. Will collaborate with the pcp for recommendations. 06-22-2021: The patient states she is now taking her medications and she is checking her blood pressures and heart rate at home. She states she seems to be doing well with management of her medications.  Advised patient to discuss options for medications, plan of care, possibility of wearing a hollister monitor for evaluation of heart rhythm  with provider; Counseled on avoidance of NSAIDs due to increased bleeding risk with anticoagulants; Counseled on importance of regular laboratory monitoring as prescribed; Counseled on seeking medical attention after a head injury or if there is blood in the urine/stool; Afib action plan reviewed. 06-22-2021: Was in  the ER in November for irregular heart rate and atypical chest pain. The patient was released and followed up with cardiology. The patient states that she is feeling much better and denies any further chest pain;  Falls:  (Status: Goal on track: YES) Provided written and verbal education re: potential causes of falls and Fall prevention strategies Reviewed medications and discussed potential  side effects of medications such as dizziness and frequent urination Advised patient of importance of notifying provider of falls. Education and support given. 06-22-2021: Reviewed safety with the patient. The patient denies any new concerns with safety at this time.  Assessed for signs and symptoms of orthostatic hypotension. Denies issues with orthostatic hypotension or dizziness  Assessed for falls since last encounter. Patient states last fall was 01-10-2021. The patient was using her walker but states she lost her balance and that is why she fell. The patient denies injury. 06-22-2021: The patient denies any new falls today. The patient states she is using her cane most of the time. States she wants to be careful and not fall. She feels like she is doing well.  Assessed patients knowledge of fall risk prevention secondary to previously provided education Provided patient information for fall alert systems Advised patient to discuss falls, safety in the home, concerns about safety and when she has new falls  with provider Assessed social determinant of health barriers  Heart Failure Interventions:  (Status: Goal on track: YES) Basic overview and discussion of pathophysiology of Heart Failure reviewed; Provided education on low sodium diet- the patient states she does not use salt in her diet. The patient stated compliance with a heart healthy diet. 06-22-2021: Review with the patient and the patient is mindful of dietary restrictions.  Reviewed Heart Failure Action Plan in depth and provided written copy; Assessed need for readable accurate scales in home- the patient does not have a scale but states she can get on. Reviewed the importance of daily weights with the patient due to the subtle changes that can alert the patient to exacerbations with heart failure. ; Provided education about placing scale on hard, flat surface- reviewed- patient to get a scale; Advised patient to weigh each morning  after emptying bladder; Discussed importance of daily weight and advised patient to weigh and record daily. 04-28-2021: The patient did not have any weights for the [facility]. Ask the patient to start a record of blood pressures and weights to have on hand at the next outreach. The patient states that she has the blood pressure cuff and has been taking but could not give the RNCM any numbers. 06-22-2021: The patient is taking her blood pressure and heart rate some at home. States systolic readings are 630-160'F and she is feeling better since seeing the cardiologist. Denies any chest pain today. Education and support given.  Reviewed role of diuretics in prevention of fluid overload and management of heart failure. 06-22-2021: Is not taking any diuretics at this time. Review and eduction provided.  Discussed the importance of keeping all appointments with provider, review of upcoming appointments  Provided patient with education about the role of exercise in the management of heart failure; Advised patient to discuss heart failure plan of care  with provider; Reviewed EF% with the patient and education provided on what the EF% was. Patients last EF% was 60-65% in January of 2022.  Hypertension: (Status: Goal on Track (progressing): YES.) Last practice recorded BP readings:  BP Readings from Last 3 Encounters:  06/14/21 132/72  05/27/21  130/88  05/25/21 127/73  Most recent eGFR/CrCl:  Lab Results  Component Value Date   EGFR 81 01/19/2021    No components found for: CRCL  Evaluation of current treatment plan related to hypertension self management and patient's adherence to plan as established by provider; Provided education to patient re: stroke prevention, s/s of heart attack and stroke; Reviewed medications with patient and discussed importance of compliance. 04-28-2021: The patient is not taking any medications except Tylenol and Gabapentin. The patient states that she does not need medications for  her heart. Education on the risk of elevated blood pressures and the need to take medications as directed. 06-22-2021: The patient now endorses taking medications as directed, blood pressures have stabilized.  Provided assistance with obtaining home blood pressure monitor via assistance from [facility] and Lavell Islam, will send an inbasket message to Lavell Islam and Janalyn Shy inquiring about the patient getting a blood pressure cuff to use so she can take her blood pressures at home and record.  04-28-2021: The patient does have the blood pressure cuff that was sent to her. She states she had not taken her blood pressure today but has taken it and it was good. The patient was unable to supply any numbers to the [facility]. Reviewed with the patient to take blood pressures and write down values to have at next outreach. The patient verbalized understanding. 06-22-2021: The patient is taking blood pressures periodically. The patient denies any issues with HTN and is now compliant with medications for heart health.   Counseled on the importance of exercise goals with target of 150 minutes per week Discussed plans with patient for ongoing care management follow up and provided patient with direct contact information for care management team; Advised patient, providing education and rationale, to monitor blood pressure daily and record, calling PCP for findings outside established parameters;  Reviewed scheduled/upcoming provider appointments including: no upcoming appointments. Reminded the patient to call for changes  Advised patient to discuss medications options and effective management of elevated blood pressures with provider; Provided education on prescribed diet heart healthy diet. 06-22-2021: The patient states she is eating well and denies any issues with dietary restrictions of Heart Healthy diet ;  Discussed complications of poorly controlled blood pressure such as heart disease, stroke, circulatory  complications, vision complications, kidney impairment, sexual dysfunction;   Grief   (Status: Goal on track: YES) Evaluation of current treatment plan related to  grief , Mental Health Concerns  self-management and patient's adherence to plan as established by provider. Discussed plans with patient for ongoing care management follow up and provided patient with direct contact information for care management team Evaluation of current treatment plan related to grief process  and patient's adherence to plan as established by provider. 06-22-2021: The patient reflected on losing her son last year and her husband 7 years ago. The patient states she is managing well right now. No acute distress noted; Advised patient to call the office for changes in mood, anxiety, or depression ; Provided education to patient re: griefshare support group locally; Reviewed medications with patient and discussed compliance, the patient is not currenttly taking anything for depression or anxiety; Provided patient with grief support  educational materials related to loss of her husband almost 7 years ago and her son a year ago, the patient has a lot of stressors in her life over grief and loss. 04-28-2021: The patient is doing well. Denies any new issues related to depression or grief. She had  covid and pneumonia and got over that. Feels she is a strong person and denies any new concerns with her mental health and well being. 06-22-2021: Continues to do well. No new concerns with her grief process at this time. Has good family support ; Social Work referral for assistance with grief, but the patient declined services at this time. The patient knows the LCSW is available to assist as needed for support and education; Discussed plans with patient for ongoing care management follow up and provided patient with direct contact information for care management team; Advised patient to discuss grief and stress  with provider; Screening  for signs and symptoms of depression related to chronic disease state;  Assessed social determinant of health barriers;   Pain:  (Status: Goal on track: YES) Pain assessment performed, patient rates chronic back pain at a 7 today on a scale of 0-10. 04-28-2021: Denies pain today. States the gabapentin and Tylenol are effective for controlling her pain. 06-22-2021: States that her pain level is at a 7 today in her back. Is taking medications as directed. Is using heat applications to help with pain management.  Medications reviewed. The patient only takes Tylenol for pain relief. 06-22-2021: Takes Tylenol and Gabapentin for pain relief and denies any concerns with pain today.  Reviewed provider established plan for pain management; Discussed importance of adherence to all scheduled medical appointments; Counseled on the importance of reporting any/all new or changed pain symptoms or management strategies to pain management provider; Advised patient to report to care team affect of pain on daily activities; Discussed use of relaxation techniques and/or diversional activities to assist with pain reduction (distraction, imagery, relaxation, massage, acupressure, TENS, heat, and cold application; Reviewed with patient prescribed pharmacological and nonpharmacological pain relief strategies; Advised patient to discuss pain relief options  with provider;  Red area to chin, mouth, and ear  (Status: Condition stable. Not addressed this visit.) Evaluation of current treatment plan related to  skin irritation with redness to chin, mouth, and ear ,  self-management and patient's adherence to plan as established by provider. Discussed plans with patient for ongoing care management follow up and provided patient with direct contact information for care management team Advised patient to call the office to get an appointment with the pcp for evaluation of skin irritation to chin, mouth, and ear. States it is a  "redness" to her chin, mouth and ear that she has had x 1 month. When she applies cream it "burns". Denies any bug bites. Will collaborate with the pcp for evaluation and treatment options; Provided education to patient re: calling the office for worsening condition, questions or concerns; Advised patient to discuss skin redness to chin, mouth, and ear with provider;   Patient Goals/Self-Care Activities: Patient will self administer medications as prescribed Patient will attend all scheduled provider appointments Patient will call pharmacy for medication refills Patient will attend church or other social activities Patient will continue to perform ADL's independently Patient will continue to perform IADL's independently Patient will call provider office for new concerns or questions Patient will work with BSW to address care coordination needs and will continue to work with the clinical team to address health care and disease management related needs.         Plan:Telephone follow up appointment with care management team member scheduled for:  08-23-2021 at 1015 am   Noreene Larsson RN, MSN, [facility] Mobile: [phone]

## 2021-06-22 NOTE — Telephone Encounter (Signed)
  Care Management   Follow Up Note   06/22/2021 Name: [name] MRN: [id number] DOB: February 18, 1951   Referred by: Larae Grooms, NP Reason for referral : Chronic Care Management (RNCM: Follow up for Chronic Disease Management and Care Coordination Needs )   Another call made to the patient and the call was completed. See new encounter for detailed information.   Follow Up Plan: Telephone follow up appointment with care management team member scheduled for: 08-23-2021 at 1015 am  Alto Denver RN, MSN, CCM Community Care Coordinator [place]  [facility] Mobile: [phone]

## 2021-06-23 ENCOUNTER — Telehealth: Payer: Medicare HMO

## 2021-06-23 DIAGNOSIS — Z20822 Contact with and (suspected) exposure to covid-19: Secondary | ICD-10-CM | POA: Diagnosis not present

## 2021-06-24 DIAGNOSIS — Z20822 Contact with and (suspected) exposure to covid-19: Secondary | ICD-10-CM | POA: Diagnosis not present

## 2021-07-22 DIAGNOSIS — I5030 Unspecified diastolic (congestive) heart failure: Secondary | ICD-10-CM | POA: Diagnosis not present

## 2021-07-22 DIAGNOSIS — I4891 Unspecified atrial fibrillation: Secondary | ICD-10-CM

## 2021-07-22 DIAGNOSIS — I10 Essential (primary) hypertension: Secondary | ICD-10-CM

## 2021-07-22 DIAGNOSIS — F4321 Adjustment disorder with depressed mood: Secondary | ICD-10-CM

## 2021-07-22 DIAGNOSIS — R69 Illness, unspecified: Secondary | ICD-10-CM | POA: Diagnosis not present

## 2021-07-24 DIAGNOSIS — Z20822 Contact with and (suspected) exposure to covid-19: Secondary | ICD-10-CM | POA: Diagnosis not present

## 2021-07-25 DIAGNOSIS — Z20822 Contact with and (suspected) exposure to covid-19: Secondary | ICD-10-CM | POA: Diagnosis not present

## 2021-07-26 ENCOUNTER — Ambulatory Visit (INDEPENDENT_AMBULATORY_CARE_PROVIDER_SITE_OTHER): Payer: Medicare HMO | Admitting: Nurse Practitioner

## 2021-07-26 ENCOUNTER — Encounter: Payer: Self-pay | Admitting: Nurse Practitioner

## 2021-07-26 ENCOUNTER — Other Ambulatory Visit: Payer: Self-pay

## 2021-07-26 VITALS — BP 131/84 | HR 66 | Temp 97.9°F | Wt 306.0 lb

## 2021-07-26 DIAGNOSIS — I4819 Other persistent atrial fibrillation: Secondary | ICD-10-CM | POA: Diagnosis not present

## 2021-07-26 DIAGNOSIS — I5030 Unspecified diastolic (congestive) heart failure: Secondary | ICD-10-CM

## 2021-07-26 DIAGNOSIS — Z20822 Contact with and (suspected) exposure to covid-19: Secondary | ICD-10-CM | POA: Diagnosis not present

## 2021-07-26 MED ORDER — GABAPENTIN 600 MG PO TABS: 600.0000 mg | ORAL_TABLET | Freq: Three times a day (TID) | ORAL | 1 refills | Status: DC | PRN

## 2021-07-26 MED ORDER — METOPROLOL SUCCINATE ER 50 MG PO TB24: 50.0000 mg | ORAL_TABLET | Freq: Two times a day (BID) | ORAL | 1 refills | Status: DC

## 2021-07-26 MED ORDER — APIXABAN 5 MG PO TABS: 5.0000 mg | ORAL_TABLET | Freq: Two times a day (BID) | ORAL | 1 refills | Status: DC

## 2021-07-26 NOTE — Progress Notes (Signed)
BP 131/84    Pulse 66    Temp 97.9 F (36.6 C) (Oral)    Wt (!) 306 lb (138.8 kg)    SpO2 97%    BMI 55.97 kg/m    Subjective:    Patient ID: [name], female    DOB: 1951/03/13, 71 y.o.   MRN: [id number]  HPI: [name] is a 71 y.o. female  Chief Complaint  Patient presents with   Diabetes   Hyperlipidemia   Hypertension   AFIB Patient presents to clinic today to follow up on her HR. States she hasn't noticed any fluttering, palpitations or tachycardia.  Saw Cardiology back in November who increased her Metoprolol to BID.   Denies HA, CP, SOB, dizziness, palpitations, visual changes, and lower extremity swelling.   Relevant past medical, surgical, family and social history reviewed and updated as indicated. Interim medical history since our last visit reviewed. Allergies and medications reviewed and updated.  Review of Systems  Respiratory:  Negative for shortness of breath.   Cardiovascular:  Negative for chest pain and palpitations.   Per HPI unless specifically indicated above     Objective:    BP 131/84    Pulse 66    Temp 97.9 F (36.6 C) (Oral)    Wt (!) 306 lb (138.8 kg)    SpO2 97%    BMI 55.97 kg/m   Wt Readings from Last 3 Encounters:  07/26/21 (!) 306 lb (138.8 kg)  06/14/21 (!) 308 lb 12.8 oz (140.1 kg)  05/27/21 300 lb (136.1 kg)    Physical Exam Vitals and nursing note reviewed.  Constitutional:      General: She is not in acute distress.    Appearance: Normal appearance. She is obese. She is not ill-appearing, toxic-appearing or diaphoretic.  HENT:     Head: Normocephalic.     Right Ear: External ear normal.     Left Ear: External ear normal.     Nose: Nose normal.     Mouth/Throat:     Mouth: Mucous membranes are moist.     Pharynx: Oropharynx is clear.  Eyes:     General:        Right eye: No discharge.        Left eye: No discharge.     Extraocular Movements: Extraocular movements intact.     Conjunctiva/sclera:  Conjunctivae normal.     Pupils: Pupils are equal, round, and reactive to light.  Cardiovascular:     Rate and Rhythm: Tachycardia present. Rhythm irregular.     Heart sounds: No murmur heard. Pulmonary:     Effort: Pulmonary effort is normal. No respiratory distress.     Breath sounds: Normal breath sounds. No wheezing or rales.  Musculoskeletal:     Cervical back: Normal range of motion and neck supple.  Skin:    General: Skin is warm and dry.     Capillary Refill: Capillary refill takes less than 2 seconds.  Neurological:     General: No focal deficit present.     Mental Status: She is alert and oriented to person, place, and time. Mental status is at baseline.  Psychiatric:        Mood and Affect: Mood normal.        Behavior: Behavior normal.        Thought Content: Thought content normal.        Judgment: Judgment normal.    Results for orders placed or performed during the hospital  encounter of 84/66/59  Basic metabolic panel  Result Value Ref Range   Sodium 137 135 - 145 mmol/L   Potassium 4.0 3.5 - 5.1 mmol/L   Chloride 103 98 - 111 mmol/L   CO2 28 22 - 32 mmol/L   Glucose, Bld 123 (H) 70 - 99 mg/dL   BUN 17 8 - 23 mg/dL   Creatinine, Ser 0.95 0.44 - 1.00 mg/dL   Calcium 9.0 8.9 - 10.3 mg/dL   GFR, Estimated >60 >60 mL/min   Anion gap 6 5 - 15  CBC  Result Value Ref Range   WBC 6.9 4.0 - 10.5 K/uL   RBC 4.25 3.87 - 5.11 MIL/uL   Hemoglobin 13.4 12.0 - 15.0 g/dL   HCT 41.3 36.0 - 46.0 %   MCV 97.2 80.0 - 100.0 fL   MCH 31.5 26.0 - 34.0 pg   MCHC 32.4 30.0 - 36.0 g/dL   RDW 13.0 11.5 - 15.5 %   Platelets 186 150 - 400 K/uL   nRBC 0.0 0.0 - 0.2 %  Troponin I (High Sensitivity)  Result Value Ref Range   Troponin I (High Sensitivity) 6 <18 ng/L  Troponin I (High Sensitivity)  Result Value Ref Range   Troponin I (High Sensitivity) 5 <18 ng/L      Assessment & Plan:   Problem List Items Addressed This Visit       Cardiovascular and Mediastinum   Atrial  fibrillation ([place]) - Primary    Cardiology noted poor ventricular rate control. Increased Metoprolol to 57m BID. She is not a candidate for ablation. Does have significant atrial enlargement. Will continue to monitor at future visits. Follow up in 3 months.      Relevant Medications   apixaban (ELIQUIS) 5 MG TABS tablet   metoprolol succinate (TOPROL-XL) 50 MG 24 hr tablet   Other Relevant Orders   Comp Met (CMET)   Lipid Profile   (HFpEF) heart failure with preserved ejection fraction ([facility]    Has seen Cardiology. Metoprolol was increased to BID. Will follow up in 6 months.      Relevant Medications   apixaban (ELIQUIS) 5 MG TABS tablet   metoprolol succinate (TOPROL-XL) 50 MG 24 hr tablet   Other Relevant Orders   Comp Met (CMET)   Lipid Profile     Other   [place] to [place]    Recommend a healthy lifestyle through diet and exercise.      Relevant Orders   Comp Met (CMET)   Lipid Profile     Follow up plan: Return in about 3 months (around 10/24/2021) for HTN, HLD, DM2 FU.

## 2021-07-26 NOTE — Assessment & Plan Note (Signed)
Has seen Cardiology. Metoprolol was increased to BID. Will follow up in 6 months.

## 2021-07-26 NOTE — Assessment & Plan Note (Signed)
Cardiology noted poor ventricular rate control. Increased Metoprolol to 50mg  BID. She is not a candidate for ablation. Does have significant atrial enlargement. Will continue to monitor at future visits. Follow up in 3 months.

## 2021-07-26 NOTE — Assessment & Plan Note (Signed)
Recommend a healthy lifestyle through diet and exercise.  °

## 2021-07-27 LAB — COMPREHENSIVE METABOLIC PANEL
ALT: 7 IU/L (ref 0–32)
AST: 11 IU/L (ref 0–40)
Albumin/Globulin Ratio: 2 (ref 1.2–2.2)
Albumin: 4.3 g/dL (ref 3.8–4.8)
Alkaline Phosphatase: 65 IU/L (ref 44–121)
BUN/Creatinine Ratio: 14 (ref 12–28)
BUN: 12 mg/dL (ref 8–27)
Bilirubin Total: 0.6 mg/dL (ref 0.0–1.2)
CO2: 23 mmol/L (ref 20–29)
Calcium: 9.1 mg/dL (ref 8.7–10.3)
Chloride: 103 mmol/L (ref 96–106)
Creatinine, Ser: 0.86 mg/dL (ref 0.57–1.00)
Globulin, Total: 2.2 g/dL (ref 1.5–4.5)
Glucose: 95 mg/dL (ref 70–99)
Potassium: 4.2 mmol/L (ref 3.5–5.2)
Sodium: 139 mmol/L (ref 134–144)
Total Protein: 6.5 g/dL (ref 6.0–8.5)
eGFR: 73 mL/min/{1.73_m2} (ref >59)

## 2021-07-27 LAB — LIPID PANEL
Chol/HDL Ratio: 2.8 ratio (ref 0.0–4.4)
Cholesterol, Total: 144 mg/dL (ref 100–199)
HDL: 51 mg/dL (ref >39)
LDL Chol Calc (NIH): 79 mg/dL (ref 0–99)
Triglycerides: 73 mg/dL (ref 0–149)
VLDL Cholesterol Cal: 14 mg/dL (ref 5–40)

## 2021-07-27 NOTE — Progress Notes (Signed)
Hi Niyah. Your lab work looks.  No concerns at this time. Continue with your current medication regimen.

## 2021-08-11 ENCOUNTER — Ambulatory Visit
Admission: RE | Admit: 2021-08-11 | Discharge: 2021-08-11 | Disposition: A | Discharge status: Home / Self Care | Payer: Medicare HMO | Source: Ambulatory Visit | Attending: Nurse Practitioner | Admitting: Nurse Practitioner

## 2021-08-11 ENCOUNTER — Encounter: Payer: Self-pay | Admitting: Nurse Practitioner

## 2021-08-11 ENCOUNTER — Telehealth (INDEPENDENT_AMBULATORY_CARE_PROVIDER_SITE_OTHER): Payer: Medicare HMO | Admitting: Nurse Practitioner

## 2021-08-11 ENCOUNTER — Ambulatory Visit: Payer: Self-pay | Admitting: *Deleted

## 2021-08-11 DIAGNOSIS — R079 Chest pain, unspecified: Secondary | ICD-10-CM | POA: Diagnosis not present

## 2021-08-11 DIAGNOSIS — U071 COVID-19: Secondary | ICD-10-CM | POA: Diagnosis not present

## 2021-08-11 DIAGNOSIS — I517 Cardiomegaly: Secondary | ICD-10-CM | POA: Diagnosis not present

## 2021-08-11 DIAGNOSIS — R0602 Shortness of breath: Secondary | ICD-10-CM | POA: Diagnosis not present

## 2021-08-11 MED ORDER — PREDNISONE 10 MG PO TABS
10.0000 mg | ORAL_TABLET | Freq: Every day | ORAL | 0 refills | Status: DC
Start: 2021-08-11 — End: 2021-08-25

## 2021-08-11 MED ORDER — MOLNUPIRAVIR EUA 200MG CAPSULE
4.0000 | ORAL_CAPSULE | Freq: Two times a day (BID) | ORAL | 0 refills | Status: AC
Start: 2021-08-11 — End: 2021-08-16

## 2021-08-11 NOTE — Telephone Encounter (Signed)
Lmom asking pt to call back to schedule an appt. °

## 2021-08-11 NOTE — Telephone Encounter (Signed)
Summary: positive covid   Daughter called in and stated that patient is positive for covid and would like to know if something can be called in nausea and cough. Please advise. Please call patient first but if you cant get a hold of her please call daughter. Only lives 1 min away.         Chief Complaint: requesting medication for nausea and cough  Symptoms: positive covid since 08/09/21. Cough non productive , shortness of breath with exertion and after cough. Hx pneumonia after last time getting covid  Frequency: sx started 08/08/21 Tuesday  Pertinent Negatives: Patient denies fever, or any other sx other than listed  Disposition: [] ED /[x] Urgent Care (no appt availability in office) / [] Appointment(In office/virtual)/ [x]  [facility]/ [] Home Care/ [] Refused Recommended Disposition /[]  Mobile Bus/ []  Follow-up with PCP Additional Notes:  Please advise today . Unsure of disposition.        Reason for Disposition  MILD difficulty breathing (e.g., minimal/no SOB at rest, SOB with walking, pulse <100)    Be seen within 24 hours  Answer Assessment - Initial Assessment Questions 1. COVID-19 DIAGNOSIS: "Who made your COVID-19 diagnosis?" "Was it confirmed by a positive lab test or self-test?" If not diagnosed by a doctor (or NP/PA), ask "Are there lots of cases (community spread) where you live?" Note: See public health department website, if unsure.     At home test Wednesday  2. COVID-19 EXPOSURE: "Was there any known exposure to COVID before the symptoms began?" CDC Definition of close contact: within 6 feet (2 meters) for a total of 15 minutes or more over a 24-hour period.      Na  3. ONSET: "When did the COVID-19 symptoms start?"      Tuesday 08/08/21 4. WORST SYMPTOM: "What is your worst symptom?" (e.g., cough, fever, shortness of breath, muscle aches)     Shortness of breath after exertion and coughing  5. COUGH: "Do you have a cough?" If Yes, ask: "How bad is  the cough?"       Yes non productive  6. FEVER: "Do you have a fever?" If Yes, ask: "What is your temperature, how was it measured, and when did it start?"     no 7. RESPIRATORY STATUS: "Describe your breathing?" (e.g., shortness of breath, wheezing, unable to speak)      SOB with exertion and cough . Hx pneumonia  8. BETTER-SAME-WORSE: "Are you getting better, staying the same or getting worse compared to yesterday?"  If getting worse, ask, "In what way?"     na 9. HIGH RISK DISEASE: "Do you have any chronic medical problems?" (e.g., asthma, heart or lung disease, weak immune system, obesity, etc.)     A fib  10. VACCINE: "Have you had the COVID-19 vaccine?" If Yes, ask: "Which one, how many shots, when did you get it?"       No  11. BOOSTER: "Have you received your COVID-19 booster?" If Yes, ask: "Which one and when did you get it?"      na 12. PREGNANCY: "Is there any chance you are pregnant?" "When was your last menstrual period?"       na 13. OTHER SYMPTOMS: "Do you have any other symptoms?"  (e.g., chills, fatigue, headache, loss of smell or taste, muscle pain, sore throat)       Cough  14. O2 SATURATION MONITOR:  "Do you use an oxygen saturation monitor (pulse oximeter) at home?" If Yes, ask "What is  your reading (oxygen level) today?" "What is your usual oxygen saturation reading?" (e.g., 95%)       na  Protocols used: Coronavirus (COVID-19) Diagnosed or Suspected-A-AH

## 2021-08-11 NOTE — Progress Notes (Signed)
There were no vitals taken for this visit.   Subjective:    Patient ID: [name], female    DOB: [birth year], 71 y.o.   MRN: [id number]  HPI: [name] is a 71 y.o. female  Chief Complaint  Patient presents with   Covid Positive    Pt states she started having symptoms of a cough, nausea, headache, and fatigue since 08/08/21. States she tested positive on 08/09/21.    COVID POSITIVE Patient states her symptoms started on 08/08/21 and tested positive on 08/09/21.  Patient is having cough, nausea, headache, fatigue, shortness of breath, wheezing.  She did not get the COVID vaccine.  Denies fever, chest pain, or chest tightness.  Relevant past medical, surgical, family and social history reviewed and updated as indicated. Interim medical history since our last visit reviewed. Allergies and medications reviewed and updated.  Review of Systems  Constitutional:  Positive for fatigue. Negative for fever.  HENT:  Positive for congestion.   Respiratory:  Positive for cough, shortness of breath and wheezing. Negative for chest tightness.   Cardiovascular:  Negative for chest pain.   Per HPI unless specifically indicated above     Objective:    There were no vitals taken for this visit.  Wt Readings from Last 3 Encounters:  07/26/21 (!) 306 lb (138.8 kg)  06/14/21 (!) 308 lb 12.8 oz (140.1 kg)  05/27/21 300 lb (136.1 kg)    Physical Exam Vitals and nursing note reviewed.  Constitutional:      General: She is not in acute distress.    Appearance: She is not ill-appearing.  HENT:     Head: Normocephalic.     Right Ear: Hearing normal.     Left Ear: Hearing normal.     Nose: Nose normal.  Pulmonary:     Effort: Pulmonary effort is normal. No respiratory distress.  Neurological:     Mental Status: She is alert.  Psychiatric:        Mood and Affect: Mood normal.        Behavior: Behavior normal.        Thought Content: Thought content normal.        Judgment:  Judgment normal.    Results for orders placed or performed in visit on 07/26/21  Comp Met (CMET)  Result Value Ref Range   Glucose 95 70 - 99 mg/dL   BUN 12 8 - 27 mg/dL   Creatinine, Ser 0.86 0.57 - 1.00 mg/dL   eGFR 73 >59 mL/min/1.73   BUN/Creatinine Ratio 14 12 - 28   Sodium 139 134 - 144 mmol/L   Potassium 4.2 3.5 - 5.2 mmol/L   Chloride 103 96 - 106 mmol/L   CO2 23 20 - 29 mmol/L   Calcium 9.1 8.7 - 10.3 mg/dL   Total Protein 6.5 6.0 - 8.5 g/dL   Albumin 4.3 3.8 - 4.8 g/dL   Globulin, Total 2.2 1.5 - 4.5 g/dL   Albumin/Globulin Ratio 2.0 1.2 - 2.2   Bilirubin Total 0.6 0.0 - 1.2 mg/dL   Alkaline Phosphatase 65 44 - 121 IU/L   AST 11 0 - 40 IU/L   ALT 7 0 - 32 IU/L  Lipid Profile  Result Value Ref Range   Cholesterol, Total 144 100 - 199 mg/dL   Triglycerides 73 0 - 149 mg/dL   HDL 51 >39 mg/dL   VLDL Cholesterol Cal 14 5 - 40 mg/dL   LDL Chol Calc (NIH) 79 0 -  99 mg/dL   Chol/HDL Ratio 2.8 0.0 - 4.4 ratio      Assessment & Plan:   Problem List Items Addressed This Visit   None Visit Diagnoses     COVID-19    -  Primary   Discussed molnupiravir with patient. Hesitant to take the medication. Prednisone sent for SOB and wheezing. Discussed s/s to monitor for. Chest xray ordered.   Relevant Medications   predniSONE (DELTASONE) 10 MG tablet   molnupiravir EUA (LAGEVRIO) 200 mg CAPS capsule   Other Relevant Orders   DG Chest 2 View        Follow up plan: Return if symptoms worsen or fail to improve.  This visit was completed via MyChart due to the restrictions of the COVID-19 pandemic. All issues as above were discussed and addressed. Physical exam was done as above through visual confirmation on MyChart. If it was felt that the patient should be evaluated in the office, they were directed there. The patient verbally consented to this visit. Location of the patient: Car- Parking lot of office Location of the provider: Office Those involved with this call:   Provider: Jon Billings, NP CMA: Yvonna Alanis, [place] Desk/Registration: Myrlene Broker This encounter was conducted via video.  I spent 15 dedicated to the care of this patient on the date of this encounter to include previsit review of 20, face to face time with the patient, and post visit ordering of testing.

## 2021-08-11 NOTE — Telephone Encounter (Signed)
Appointment scheduled.

## 2021-08-14 ENCOUNTER — Telehealth: Payer: Self-pay | Admitting: *Deleted

## 2021-08-14 NOTE — Telephone Encounter (Signed)
Pt called and stated she was returning a call from the office. Discussed note from Larae Grooms, NP from earlier today regarding CXR. Pt states she is feeling better even though she is still coughing. She is not running a fever, and not coughing up anything. She is still taking prednisone as well as Covid medication. She will finish the medications as ordered.

## 2021-08-14 NOTE — Progress Notes (Signed)
Please let patient know that her chest xray showed possible congestive heart failure. There is also an area that could be related to swelling or pneumonia.  Please see how she is feeling.  Please find out if she took the molnupirvair and encourage her to complete the course of prednisone.

## 2021-08-17 ENCOUNTER — Other Ambulatory Visit: Payer: Self-pay | Admitting: Nurse Practitioner

## 2021-08-17 NOTE — Telephone Encounter (Signed)
Has newer rx at mail order. Requested Prescriptions  Pending Prescriptions Disp Refills   ELIQUIS 5 MG TABS tablet [Pharmacy Med Name: ELIQUIS 5 MG TABLET] 60 tablet 1    Sig: TAKE 1 TABLET BY MOUTH TWICE A DAY     Hematology:  Anticoagulants Passed - 08/17/2021  1:29 AM      Passed - HGB in normal range and within 360 days    Hemoglobin  Date Value Ref Range Status  05/27/2021 13.4 12.0 - 15.0 g/dL Final  02/08/2021 12.9 11.1 - 15.9 g/dL Final         Passed - PLT in normal range and within 360 days    Platelets  Date Value Ref Range Status  05/27/2021 186 150 - 400 K/uL Final  02/08/2021 170 150 - 450 x10E3/uL Final         Passed - HCT in normal range and within 360 days    HCT  Date Value Ref Range Status  05/27/2021 41.3 36.0 - 46.0 % Final   Hematocrit  Date Value Ref Range Status  02/08/2021 38.1 34.0 - 46.6 % Final         Passed - Cr in normal range and within 360 days    Creatinine, Ser  Date Value Ref Range Status  07/26/2021 0.86 0.57 - 1.00 mg/dL Final         Passed - Valid encounter within last 12 months    Recent Outpatient Visits          6 days ago [place], Santiago Glad, NP   3 weeks ago Persistent atrial fibrillation ([place])   [facility] Jon Billings, NP   2 months ago Atrial fibrillation, unspecified type [facility])   [facility] Jon Billings, NP   3 months ago Perioral dermatitis   [place], Santiago Glad, NP   6 months ago Heart failure with preserved ejection fraction, unspecified HF chronicity ([place])   [facility] Jon Billings, NP      Future Appointments            In 2 months Jon Billings, NP [facility] [place]   In 7 months  [employer], [place]

## 2021-08-23 ENCOUNTER — Telehealth: Payer: Medicare HMO

## 2021-08-23 ENCOUNTER — Telehealth: Payer: Self-pay

## 2021-08-23 ENCOUNTER — Ambulatory Visit (INDEPENDENT_AMBULATORY_CARE_PROVIDER_SITE_OTHER): Payer: Medicare HMO

## 2021-08-23 DIAGNOSIS — I4819 Other persistent atrial fibrillation: Secondary | ICD-10-CM

## 2021-08-23 DIAGNOSIS — M1711 Unilateral primary osteoarthritis, right knee: Secondary | ICD-10-CM

## 2021-08-23 DIAGNOSIS — M545 Low back pain, unspecified: Secondary | ICD-10-CM

## 2021-08-23 DIAGNOSIS — I10 Essential (primary) hypertension: Secondary | ICD-10-CM

## 2021-08-23 DIAGNOSIS — F4321 Adjustment disorder with depressed mood: Secondary | ICD-10-CM

## 2021-08-23 DIAGNOSIS — U071 COVID-19: Secondary | ICD-10-CM

## 2021-08-23 DIAGNOSIS — I5030 Unspecified diastolic (congestive) heart failure: Secondary | ICD-10-CM

## 2021-08-23 DIAGNOSIS — Z9181 History of falling: Secondary | ICD-10-CM

## 2021-08-23 NOTE — Telephone Encounter (Signed)
°  Care Management   Follow Up Note   08/23/2021 Name: [name] MRN: [id number] DOB: October 27, 1950   Referred by: Jon Billings, NP Reason for referral : Chronic Care Management (RNCM: Follow up for Chronic Disease Management and Care Coordination Needs )   Call made back to the patient and the RNCM was able to complete outreach appointment. See new encounter for details.   Follow Up Plan: Telephone follow up appointment with care management team member scheduled for: 10-31-2021 at 345 pm  Noreene Larsson RN, MSN, [facility] Mobile: [phone]

## 2021-08-23 NOTE — Chronic Care Management (AMB) (Signed)
Chronic Care Management   CCM RN Visit Note  08/23/2021 Name: [name] MRN: [id number] DOB: 12/31/1950  Subjective: [name] is a 71 y.o. year old female who is a primary care patient of Jon Billings, NP. The care management team was consulted for assistance with disease management and care coordination needs.    Engaged with patient by telephone for follow up visit in response to provider referral for case management and/or care coordination services.   Consent to Services:  The patient was given information about Chronic Care Management services, agreed to services, and gave verbal consent prior to initiation of services.  Please see initial visit note for detailed documentation.   Patient agreed to services and verbal consent obtained.   Assessment: Review of patient past medical history, allergies, medications, health status, including review of consultants reports, laboratory and other test data, was performed as part of comprehensive evaluation and provision of chronic care management services.   SDOH (Social Determinants of Health) assessments and interventions performed:    CCM Care Plan  No Known Allergies  Outpatient Encounter Medications as of 08/23/2021  Medication Sig   Acetaminophen (TYLENOL 8 HOUR PO) Take by mouth as needed.   albuterol (VENTOLIN HFA) 108 (90 Base) MCG/ACT inhaler Inhale 1-2 puffs into the lungs every 6 (six) hours as needed for wheezing or shortness of breath.   apixaban (ELIQUIS) 5 MG TABS tablet Take 1 tablet (5 mg total) by mouth 2 (two) times daily.   gabapentin (NEURONTIN) 600 MG tablet Take 1 tablet (600 mg total) by mouth 3 (three) times daily as needed.   metoprolol succinate (TOPROL-XL) 50 MG 24 hr tablet Take 1 tablet (50 mg total) by mouth in the morning and at bedtime. Take with or immediately following a meal.   ondansetron (ZOFRAN) 4 MG tablet Take 1 tablet (4 mg total) by mouth every 8 (eight) hours as needed for nausea  or vomiting.   predniSONE (DELTASONE) 10 MG tablet Take 1 tablet (10 mg total) by mouth daily with breakfast.   Spacer/Aero-Holding Chambers [facility] DIAMOND) MISC by Does not apply route.   No facility-administered encounter medications on file as of 08/23/2021.    Patient Active Problem List   Diagnosis Date Noted   Atrial fibrillation ([place]) 08/24/2020   Pneumonia due to COVID-19 virus 08/24/2020   (HFpEF) heart failure with preserved ejection fraction ([place]) 08/24/2020   Viral URI 08/03/2020   Postherpetic neuralgia 03/29/2020   Grief 03/29/2020   Cough 03/29/2020   Encounter for screening colonoscopy    Elevated blood pressure reading 01/19/2020   Osteoarthritis of right knee 12/21/2019   Morbid obesity due to excess calories ([place]) 08/13/2015   Chronic pain of right knee 08/13/2015    Conditions to be addressed/monitored:Atrial Fibrillation, CHF, HTN, and Chronic pain, grief, Recent COVID + dx, and frequent falls  Care Plan : RNCM: Management of Chronic Diseases: HF, AFIB, HTN, Chronic pain, grief and frequent falls  Updates made by Vanita Ingles, RN since 08/23/2021 12:00 AM     Problem: RNCM: Management of HF, AFIB, HTN, chronic pain, grief, and frequent falls   Priority: High     Long-Range Goal: RNCM: Effective management of HF, AFIB, HTN, Chronic pain, grief, and falls prevention   Start Date: 01/27/2021  Expected End Date: 01/27/2022  Recent Progress: On track  Priority: High  Note:   Current Barriers:  Knowledge Deficits related to plan of care for management of Atrial Fibrillation, CHF, HTN, and Chronic  pain, grief and falls  Care Coordination needs related to Level of care concerns, Medication procurement, and Mental Health Concerns  in a patient with Atrial Fibrillation, CHF, HTN, and chronic pain, grief, falls  Chronic Disease Management support and education needs related to Atrial Fibrillation, CHF, HTN, and chronic pain, grief, falls Financial Constraints.   Difficulty obtaining medications  RNCM Clinical Goal(s):  Patient will verbalize understanding of plan for management of Atrial Fibrillation, CHF, HTN, and Chronic pain, grief and falls work with RN Case Manager and Pharmacist to address needs related to Atrial Fibrillation, CHF, HTN, and chronic pain, grief and falls and Financial constraints related to inability to pay for medications , Level of care concerns, and Mental Health Concerns  take all medications exactly as prescribed and will call provider for medication related questions attend all scheduled medical appointments: 10-25-2021 at 220 pm demonstrate a decrease in Atrial Fibrillation, CHF, HTN, and Chronic pain, grief and fall exacerbations demonstrate improved adherence to prescribed treatment plan for Atrial Fibrillation, CHF, HTN, and Chronic pain, grief, and falls  demonstrate improved health management independence verbalize basic understanding of Atrial Fibrillation, CHF, HTN, and Chronic pain, grief, and fall disease process and self health management plan demonstrate understanding of rationale for each prescribed medication work with CM team pharmacist to assist with medication cost constraints and reconciliation demonstrate ongoing self health care management ability through collaboration with RN Care manager, provider, and care team.   Interventions: 1:1 collaboration with Jon Billings, regarding development and update of comprehensive plan of care as evidenced by provider attestation and co-signature Inter-disciplinary care team collaboration (see longitudinal plan of care)   A-fib:  (Status: Goal on track: YES.) Counseled on increased risk of stroke due to Afib and benefits of anticoagulation for stroke prevention; Reviewed importance of adherence to anticoagulant exactly as prescribed, the patient states she went for a cardioversion and they told her she did not need it. She has been off of  her medications. The  patient states she will do what the doctor tells her to do but right now she is not taking any medications for her heart conditions.  She further states that she can not afford the medications and would need help with options if the MD wanted her on certain medications. 04-28-2021: The patient states that she does not have to take medications for her heart health. Discussed with her the medications list and she said that she went to have the procedure but did not have to have. The patient denies any new issues with her AFIB. Will collaborate with the pcp for recommendations. 08-23-2021: The patient states she is now taking her medications and she is checking her blood pressures and heart rate at home. She states she seems to be doing well with management of her medications.  Advised patient to discuss options for medications, plan of care, possibility of wearing a hollister monitor for evaluation of heart rhythm  with provider; Counseled on avoidance of NSAIDs due to increased bleeding risk with anticoagulants; Counseled on importance of regular laboratory monitoring as prescribed; Counseled on seeking medical attention after a head injury or if there is blood in the urine/stool; Afib action plan reviewed. 06-22-2021: Was in the ER in November for irregular heart rate and atypical chest pain. The patient was released and followed up with cardiology. The patient states that she is feeling much better and denies any further chest pain. 08-23-2021: The patient denies any issues with her AFIB or heart healthy. Currently getting  over COVID + diagnosis. The patient denies any acute distress today.   Falls:  (Status: Goal on track: YES) Provided written and verbal education re: potential causes of falls and Fall prevention strategies Reviewed medications and discussed potential side effects of medications such as dizziness and frequent urination Advised patient of importance of notifying provider of falls. Education  and support given. 08-23-2021: Reviewed safety with the patient. The patient denies any new concerns with safety at this time. States she is using her cane when ambulating and she has not had any new falls. Will continue to monitor for changes. Assessed for signs and symptoms of orthostatic hypotension. Denies issues with orthostatic hypotension or dizziness  Assessed for falls since last encounter. Patient states last fall was 01-10-2021. The patient was using her walker but states she lost her balance and that is why she fell. The patient denies injury. 08-23-2021: The patient denies any new falls today. The patient states she is using her cane most of the time. States she wants to be careful and not fall. She feels like she is doing well.  Assessed patients knowledge of fall risk prevention secondary to previously provided education Provided patient information for fall alert systems Advised patient to discuss falls, safety in the home, concerns about safety and when she has new falls  with provider Assessed social determinant of health barriers  Heart Failure Interventions:  (Status: Goal on track: YES) Basic overview and discussion of pathophysiology of Heart Failure reviewed; Provided education on low sodium diet- the patient states she does not use salt in her diet. The patient stated compliance with a heart healthy diet.08-23-2021: Review with the patient and the patient is mindful of dietary restrictions.  Reviewed Heart Failure Action Plan in depth and provided written copy; Assessed need for readable accurate scales in home- the patient does not have a scale but states she can get on. Reviewed the importance of daily weights with the patient due to the subtle changes that can alert the patient to exacerbations with heart failure. ; Provided education about placing scale on hard, flat surface- reviewed- patient to get a scale; Advised patient to weigh each morning after emptying bladder; Discussed  importance of daily weight and advised patient to weigh and record daily. 04-28-2021: The patient did not have any weights for the [facility]. Ask the patient to start a record of blood pressures and weights to have on hand at the next outreach. The patient states that she has the blood pressure cuff and has been taking but could not give the RNCM any numbers. 06-22-2021: The patient is taking her blood pressure and heart rate some at home. States systolic readings are 665-993'T and she is feeling better since seeing the cardiologist. Denies any chest pain today. Education and support given.  Reviewed role of diuretics in prevention of fluid overload and management of heart failure. 08-23-2021: Is not taking any diuretics at this time. Review and eduction provided.  Discussed the importance of keeping all appointments with provider, review of upcoming appointments. 08-23-2021: Has seen cardiologist. Saw pcp recently. Will see pcp again 10-25-2021  Provided patient with education about the role of exercise in the management of heart failure; Advised patient to discuss heart failure plan of care  with provider. 08-23-2021: The patient denies any swelling or edema at this time; Reviewed EF% with the patient and education provided on what the EF% was. Patients last EF% was 60-65% in January of 2022.  Hypertension: (Status: Goal on Track (  progressing): YES.) Last practice recorded BP readings:  BP Readings from Last 3 Encounters:  07/26/21 131/84  06/14/21 132/72  05/27/21 130/88  Most recent eGFR/CrCl:  Lab Results  Component Value Date   EGFR 73 07/26/2021    No components found for: CRCL  Evaluation of current treatment plan related to hypertension self management and patient's adherence to plan as established by provider. 08-23-2021: The patient has stable blood pressures. Doing well with management of blood pressures and HTN at this time. Currently getting over COVID + diagnosis and other than that feels she is  doing very well. Education and support given.  Provided education to patient re: stroke prevention, s/s of heart attack and stroke; Reviewed medications with patient and discussed importance of compliance. 04-28-2021: The patient is not taking any medications except Tylenol and Gabapentin. The patient states that she does not need medications for her heart. Education on the risk of elevated blood pressures and the need to take medications as directed. 08-23-2021: The patient now endorses taking medications as directed, blood pressures have stabilized.  Provided assistance with obtaining home blood pressure monitor via assistance from [facility] and Lavell Islam, will send an inbasket message to Lavell Islam and Janalyn Shy inquiring about the patient getting a blood pressure cuff to use so she can take her blood pressures at home and record.  04-28-2021: The patient does have the blood pressure cuff that was sent to her. She states she had not taken her blood pressure today but has taken it and it was good. The patient was unable to supply any numbers to the [facility]. Reviewed with the patient to take blood pressures and write down values to have at next outreach. The patient verbalized understanding. 08-23-2021: The patient is taking blood pressures periodically. The patient denies any issues with HTN and is now compliant with medications for heart health.   Counseled on the importance of exercise goals with target of 150 minutes per week Discussed plans with patient for ongoing care management follow up and provided patient with direct contact information for care management team; Advised patient, providing education and rationale, to monitor blood pressure daily and record, calling PCP for findings outside established parameters;  Reviewed scheduled/upcoming provider appointments including: no upcoming appointments. Next appointment with the pcp is 10-25-2021. Reminded the patient to call for changes  Advised patient to  discuss medications options and effective management of elevated blood pressures with provider; Provided education on prescribed diet heart healthy diet. 08-23-2021: The patient states she is eating well and denies any issues with dietary restrictions of Heart Healthy diet ;  Discussed complications of poorly controlled blood pressure such as heart disease, stroke, circulatory complications, vision complications, kidney impairment, sexual dysfunction;   Grief   (Status: Goal on track: YES) Evaluation of current treatment plan related to  grief , Mental Health Concerns  self-management and patient's adherence to plan as established by provider.08-23-2021: The patient states she is doing well. She has been sick with COVID but is feeling better each day. Denies any new concerns with her mental health and well being. Is staying in and not going out unless she absolutely has to do so.  Discussed plans with patient for ongoing care management follow up and provided patient with direct contact information for care management team Evaluation of current treatment plan related to grief process  and patient's adherence to plan as established by provider. 06-22-2021: The patient reflected on losing her son last year and her husband 23  years ago. The patient states she is managing well right now. No acute distress noted. 08-23-2021: Ongoing support and education given; Advised patient to call the office for changes in mood, anxiety, or depression ; Provided education to patient re: griefshare support group locally; Reviewed medications with patient and discussed compliance, the patient is not currenttly taking anything for depression or anxiety; Provided patient with grief support  educational materials related to loss of her husband almost 7 years ago and her son a year ago, the patient has a lot of stressors in her life over grief and loss. 04-28-2021: The patient is doing well. Denies any new issues related to depression  or grief. She had covid and pneumonia and got over that. Feels she is a strong person and denies any new concerns with her mental health and well being. 08-23-2021: Continues to do well. No new concerns with her grief process at this time. Has good family support ; Social Work referral for assistance with grief, but the patient declined services at this time. The patient knows the LCSW is available to assist as needed for support and education; Discussed plans with patient for ongoing care management follow up and provided patient with direct contact information for care management team; Advised patient to discuss grief and stress  with provider; Screening for signs and symptoms of depression related to chronic disease state;  Assessed social determinant of health barriers;   Pain:  (Status: Goal on track: YES) Pain assessment performed, patient rates chronic back pain at a 7 today on a scale of 0-10. 04-28-2021: Denies pain today. States the gabapentin and Tylenol are effective for controlling her pain. 08-23-2021: States that her pain level is at a 6 today in her back. Is taking medications as directed. Is using heat applications to help with pain management.  Medications reviewed. The patient only takes Tylenol for pain relief.08-23-2021: Takes Tylenol and Gabapentin for pain relief and denies any concerns with pain today.  Reviewed provider established plan for pain management; Discussed importance of adherence to all scheduled medical appointments; Counseled on the importance of reporting any/all new or changed pain symptoms or management strategies to pain management provider; Advised patient to report to care team affect of pain on daily activities; Discussed use of relaxation techniques and/or diversional activities to assist with pain reduction (distraction, imagery, relaxation, massage, acupressure, TENS, heat, and cold application; Reviewed with patient prescribed pharmacological and  nonpharmacological pain relief strategies; Advised patient to discuss pain relief options  with provider;  Red area to chin, mouth, and ear  (Status: Condition stable. Not addressed this visit.) Evaluation of current treatment plan related to  skin irritation with redness to chin, mouth, and ear ,  self-management and patient's adherence to plan as established by provider. Discussed plans with patient for ongoing care management follow up and provided patient with direct contact information for care management team Advised patient to call the office to get an appointment with the pcp for evaluation of skin irritation to chin, mouth, and ear. States it is a "redness" to her chin, mouth and ear that she has had x 1 month. When she applies cream it "burns". Denies any bug bites. Will collaborate with the pcp for evaluation and treatment options; Provided education to patient re: calling the office for worsening condition, questions or concerns; Advised patient to discuss skin redness to chin, mouth, and ear with provider;   Patient Goals/Self-Care Activities: Patient will self administer medications as prescribed Patient will attend all  scheduled provider appointments Patient will call pharmacy for medication refills Patient will attend church or other social activities Patient will continue to perform ADL's independently Patient will continue to perform IADL's independently Patient will call provider office for new concerns or questions Patient will work with BSW to address care coordination needs and will continue to work with the clinical team to address health care and disease management related needs.         Plan:Telephone follow up appointment with care management team member scheduled for:  10-31-2021 at 73 pm  Noreene Larsson RN, MSN, [facility] Mobile: [phone]

## 2021-08-23 NOTE — Patient Instructions (Signed)
Visit Information  Thank you for taking time to visit with me today. Please don't hesitate to contact me if I can be of assistance to you before our next scheduled telephone appointment.  Following are the goals we discussed today:  RNCM Clinical Goal(s):  Patient will verbalize understanding of plan for management of Atrial Fibrillation, CHF, HTN, and Chronic pain, grief and falls work with RN Case Manager and Pharmacist to address needs related to Atrial Fibrillation, CHF, HTN, and chronic pain, grief and falls and Financial constraints related to inability to pay for medications , Level of care concerns, and Mental Health Concerns  take all medications exactly as prescribed and will call provider for medication related questions attend all scheduled medical appointments: 10-25-2021 at 220 pm demonstrate a decrease in Atrial Fibrillation, CHF, HTN, and Chronic pain, grief and fall exacerbations demonstrate improved adherence to prescribed treatment plan for Atrial Fibrillation, CHF, HTN, and Chronic pain, grief, and falls  demonstrate improved health management independence verbalize basic understanding of Atrial Fibrillation, CHF, HTN, and Chronic pain, grief, and fall disease process and self health management plan demonstrate understanding of rationale for each prescribed medication work with CM team pharmacist to assist with medication cost constraints and reconciliation demonstrate ongoing self health care management ability through collaboration with RN Care manager, provider, and care team.    Interventions: 1:1 collaboration with Jon Billings, regarding development and update of comprehensive plan of care as evidenced by provider attestation and co-signature Inter-disciplinary care team collaboration (see longitudinal plan of care)     A-fib:  (Status: Goal on track: YES.) Counseled on increased risk of stroke due to Afib and benefits of anticoagulation for stroke  prevention; Reviewed importance of adherence to anticoagulant exactly as prescribed, the patient states she went for a cardioversion and they told her she did not need it. She has been off of  her medications. The patient states she will do what the doctor tells her to do but right now she is not taking any medications for her heart conditions.  She further states that she can not afford the medications and would need help with options if the MD wanted her on certain medications. 04-28-2021: The patient states that she does not have to take medications for her heart health. Discussed with her the medications list and she said that she went to have the procedure but did not have to have. The patient denies any new issues with her AFIB. Will collaborate with the pcp for recommendations. 08-23-2021: The patient states she is now taking her medications and she is checking her blood pressures and heart rate at home. She states she seems to be doing well with management of her medications.  Advised patient to discuss options for medications, plan of care, possibility of wearing a hollister monitor for evaluation of heart rhythm  with provider; Counseled on avoidance of NSAIDs due to increased bleeding risk with anticoagulants; Counseled on importance of regular laboratory monitoring as prescribed; Counseled on seeking medical attention after a head injury or if there is blood in the urine/stool; Afib action plan reviewed. 06-22-2021: Was in the ER in November for irregular heart rate and atypical chest pain. The patient was released and followed up with cardiology. The patient states that she is feeling much better and denies any further chest pain. 08-23-2021: The patient denies any issues with her AFIB or heart healthy. Currently getting over COVID + diagnosis. The patient denies any acute distress today.    Falls:  (  Status: Goal on track: YES) Provided written and verbal education re: potential causes of falls and  Fall prevention strategies Reviewed medications and discussed potential side effects of medications such as dizziness and frequent urination Advised patient of importance of notifying provider of falls. Education and support given. 08-23-2021: Reviewed safety with the patient. The patient denies any new concerns with safety at this time. States she is using her cane when ambulating and she has not had any new falls. Will continue to monitor for changes. Assessed for signs and symptoms of orthostatic hypotension. Denies issues with orthostatic hypotension or dizziness  Assessed for falls since last encounter. Patient states last fall was 01-10-2021. The patient was using her walker but states she lost her balance and that is why she fell. The patient denies injury. 08-23-2021: The patient denies any new falls today. The patient states she is using her cane most of the time. States she wants to be careful and not fall. She feels like she is doing well.  Assessed patients knowledge of fall risk prevention secondary to previously provided education Provided patient information for fall alert systems Advised patient to discuss falls, safety in the home, concerns about safety and when she has new falls  with provider Assessed social determinant of health barriers   Heart Failure Interventions:  (Status: Goal on track: YES) Basic overview and discussion of pathophysiology of Heart Failure reviewed; Provided education on low sodium diet- the patient states she does not use salt in her diet. The patient stated compliance with a heart healthy diet.08-23-2021: Review with the patient and the patient is mindful of dietary restrictions.  Reviewed Heart Failure Action Plan in depth and provided written copy; Assessed need for readable accurate scales in home- the patient does not have a scale but states she can get on. Reviewed the importance of daily weights with the patient due to the subtle changes that can alert the  patient to exacerbations with heart failure. ; Provided education about placing scale on hard, flat surface- reviewed- patient to get a scale; Advised patient to weigh each morning after emptying bladder; Discussed importance of daily weight and advised patient to weigh and record daily. 04-28-2021: The patient did not have any weights for the [facility]. Ask the patient to start a record of blood pressures and weights to have on hand at the next outreach. The patient states that she has the blood pressure cuff and has been taking but could not give the RNCM any numbers. 06-22-2021: The patient is taking her blood pressure and heart rate some at home. States systolic readings are 419-622'W and she is feeling better since seeing the cardiologist. Denies any chest pain today. Education and support given.  Reviewed role of diuretics in prevention of fluid overload and management of heart failure. 08-23-2021: Is not taking any diuretics at this time. Review and eduction provided.  Discussed the importance of keeping all appointments with provider, review of upcoming appointments. 08-23-2021: Has seen cardiologist. Saw pcp recently. Will see pcp again 10-25-2021  Provided patient with education about the role of exercise in the management of heart failure; Advised patient to discuss heart failure plan of care  with provider. 08-23-2021: The patient denies any swelling or edema at this time; Reviewed EF% with the patient and education provided on what the EF% was. Patients last EF% was 60-65% in January of 2022.   Hypertension: (Status: Goal on Track (progressing): YES.) Last practice recorded BP readings:     BP Readings  from Last 3 Encounters:  07/26/21 131/84  06/14/21 132/72  05/27/21 130/88  Most recent eGFR/CrCl:       Lab Results  Component Value Date    EGFR 73 07/26/2021    No components found for: CRCL   Evaluation of current treatment plan related to hypertension self management and patient's adherence  to plan as established by provider. 08-23-2021: The patient has stable blood pressures. Doing well with management of blood pressures and HTN at this time. Currently getting over COVID + diagnosis and other than that feels she is doing very well. Education and support given.  Provided education to patient re: stroke prevention, s/s of heart attack and stroke; Reviewed medications with patient and discussed importance of compliance. 04-28-2021: The patient is not taking any medications except Tylenol and Gabapentin. The patient states that she does not need medications for her heart. Education on the risk of elevated blood pressures and the need to take medications as directed. 08-23-2021: The patient now endorses taking medications as directed, blood pressures have stabilized.  Provided assistance with obtaining home blood pressure monitor via assistance from [facility] and Lavell Islam, will send an inbasket message to Lavell Islam and Janalyn Shy inquiring about the patient getting a blood pressure cuff to use so she can take her blood pressures at home and record.  04-28-2021: The patient does have the blood pressure cuff that was sent to her. She states she had not taken her blood pressure today but has taken it and it was good. The patient was unable to supply any numbers to the [facility]. Reviewed with the patient to take blood pressures and write down values to have at next outreach. The patient verbalized understanding. 08-23-2021: The patient is taking blood pressures periodically. The patient denies any issues with HTN and is now compliant with medications for heart health.   Counseled on the importance of exercise goals with target of 150 minutes per week Discussed plans with patient for ongoing care management follow up and provided patient with direct contact information for care management team; Advised patient, providing education and rationale, to monitor blood pressure daily and record, calling PCP for findings  outside established parameters;  Reviewed scheduled/upcoming provider appointments including: no upcoming appointments. Next appointment with the pcp is 10-25-2021. Reminded the patient to call for changes  Advised patient to discuss medications options and effective management of elevated blood pressures with provider; Provided education on prescribed diet heart healthy diet. 08-23-2021: The patient states she is eating well and denies any issues with dietary restrictions of Heart Healthy diet ;  Discussed complications of poorly controlled blood pressure such as heart disease, stroke, circulatory complications, vision complications, kidney impairment, sexual dysfunction;    Grief   (Status: Goal on track: YES) Evaluation of current treatment plan related to  grief , Mental Health Concerns  self-management and patient's adherence to plan as established by provider.08-23-2021: The patient states she is doing well. She has been sick with COVID but is feeling better each day. Denies any new concerns with her mental health and well being. Is staying in and not going out unless she absolutely has to do so.  Discussed plans with patient for ongoing care management follow up and provided patient with direct contact information for care management team Evaluation of current treatment plan related to grief process  and patient's adherence to plan as established by provider. 06-22-2021: The patient reflected on losing her son last year and her husband 7 years ago.  The patient states she is managing well right now. No acute distress noted. 08-23-2021: Ongoing support and education given; Advised patient to call the office for changes in mood, anxiety, or depression ; Provided education to patient re: griefshare support group locally; Reviewed medications with patient and discussed compliance, the patient is not currenttly taking anything for depression or anxiety; Provided patient with grief support  educational  materials related to loss of her husband almost 7 years ago and her son a year ago, the patient has a lot of stressors in her life over grief and loss. 04-28-2021: The patient is doing well. Denies any new issues related to depression or grief. She had covid and pneumonia and got over that. Feels she is a strong person and denies any new concerns with her mental health and well being. 08-23-2021: Continues to do well. No new concerns with her grief process at this time. Has good family support ; Social Work referral for assistance with grief, but the patient declined services at this time. The patient knows the LCSW is available to assist as needed for support and education; Discussed plans with patient for ongoing care management follow up and provided patient with direct contact information for care management team; Advised patient to discuss grief and stress  with provider; Screening for signs and symptoms of depression related to chronic disease state;  Assessed social determinant of health barriers;    Pain:  (Status: Goal on track: YES) Pain assessment performed, patient rates chronic back pain at a 7 today on a scale of 0-10. 04-28-2021: Denies pain today. States the gabapentin and Tylenol are effective for controlling her pain. 08-23-2021: States that her pain level is at a 6 today in her back. Is taking medications as directed. Is using heat applications to help with pain management.  Medications reviewed. The patient only takes Tylenol for pain relief.08-23-2021: Takes Tylenol and Gabapentin for pain relief and denies any concerns with pain today.  Reviewed provider established plan for pain management; Discussed importance of adherence to all scheduled medical appointments; Counseled on the importance of reporting any/all new or changed pain symptoms or management strategies to pain management provider; Advised patient to report to care team affect of pain on daily activities; Discussed use of  relaxation techniques and/or diversional activities to assist with pain reduction (distraction, imagery, relaxation, massage, acupressure, TENS, heat, and cold application; Reviewed with patient prescribed pharmacological and nonpharmacological pain relief strategies; Advised patient to discuss pain relief options  with provider;   Red area to chin, mouth, and ear  (Status: Condition stable. Not addressed this visit.) Evaluation of current treatment plan related to  skin irritation with redness to chin, mouth, and ear ,  self-management and patient's adherence to plan as established by provider. Discussed plans with patient for ongoing care management follow up and provided patient with direct contact information for care management team Advised patient to call the office to get an appointment with the pcp for evaluation of skin irritation to chin, mouth, and ear. States it is a "redness" to her chin, mouth and ear that she has had x 1 month. When she applies cream it "burns". Denies any bug bites. Will collaborate with the pcp for evaluation and treatment options; Provided education to patient re: calling the office for worsening condition, questions or concerns; Advised patient to discuss skin redness to chin, mouth, and ear with provider;    Patient Goals/Self-Care Activities: Patient will self administer medications as prescribed Patient will attend  all scheduled provider appointments Patient will call pharmacy for medication refills Patient will attend church or other social activities Patient will continue to perform ADL's independently Patient will continue to perform IADL's independently Patient will call provider office for new concerns or questions Patient will work with BSW to address care coordination needs and will continue to work with the clinical team to address health care and disease management related needs.        Our next appointment is by telephone on 10-31-2021 at 345  pm  Please call the care guide team at [phone] if you need to cancel or reschedule your appointment.   If you are experiencing a Mental Health or [place] or need someone to talk to, please call the Suicide and Crisis Lifeline: 988 call the [place] National Suicide Prevention Lifeline: (646)620-5381 or TTY: (916) 833-4152 TTY (479)047-8575) to talk to a trained counselor call 1-800-273-TALK (toll free, 24 hour hotline)   Patient verbalizes understanding of instructions and care plan provided today and agrees to view in [place]. Active MyChart status confirmed with patient.    Noreene Larsson RN, MSN, [facility] Mobile: [phone]

## 2021-08-25 ENCOUNTER — Ambulatory Visit (INDEPENDENT_AMBULATORY_CARE_PROVIDER_SITE_OTHER): Payer: Medicare HMO | Admitting: Nurse Practitioner

## 2021-08-25 ENCOUNTER — Encounter: Payer: Self-pay | Admitting: Nurse Practitioner

## 2021-08-25 ENCOUNTER — Other Ambulatory Visit: Payer: Self-pay

## 2021-08-25 ENCOUNTER — Ambulatory Visit: Payer: Self-pay | Admitting: *Deleted

## 2021-08-25 ENCOUNTER — Telehealth: Payer: Self-pay | Admitting: Nurse Practitioner

## 2021-08-25 VITALS — BP 138/84 | HR 120 | Temp 97.7°F | Wt 310.2 lb

## 2021-08-25 DIAGNOSIS — R0602 Shortness of breath: Secondary | ICD-10-CM

## 2021-08-25 DIAGNOSIS — I4819 Other persistent atrial fibrillation: Secondary | ICD-10-CM | POA: Diagnosis not present

## 2021-08-25 DIAGNOSIS — R000 Tachycardia, unspecified: Secondary | ICD-10-CM | POA: Diagnosis not present

## 2021-08-25 DIAGNOSIS — I5030 Unspecified diastolic (congestive) heart failure: Secondary | ICD-10-CM | POA: Diagnosis not present

## 2021-08-25 DIAGNOSIS — U071 COVID-19: Secondary | ICD-10-CM | POA: Diagnosis not present

## 2021-08-25 DIAGNOSIS — J1282 Pneumonia due to coronavirus disease 2019: Secondary | ICD-10-CM

## 2021-08-25 MED ORDER — PREDNISONE 10 MG PO TABS: 10.0000 mg | ORAL_TABLET | Freq: Every day | ORAL | 0 refills | Status: DC

## 2021-08-25 MED ORDER — AMOXICILLIN-POT CLAVULANATE 875-125 MG PO TABS: 1.0000 | ORAL_TABLET | Freq: Two times a day (BID) | ORAL | 0 refills | Status: DC

## 2021-08-25 MED ORDER — METOPROLOL SUCCINATE ER 100 MG PO TB24: 100.0000 mg | ORAL_TABLET | Freq: Two times a day (BID) | ORAL | 1 refills | Status: DC

## 2021-08-25 NOTE — Telephone Encounter (Signed)
Patient called, left VM to return the call to the office to speak to a nurse. Will need to know why the patient is requesting a refill on Prednisone.

## 2021-08-25 NOTE — Assessment & Plan Note (Signed)
Will treat with Augmentin and Prednisone taper. Will repeat chest xray next week.  Discussed signs and symptoms to monitor for and when to see higher level of care. Follow up in 1 week.

## 2021-08-25 NOTE — Assessment & Plan Note (Addendum)
Hx of Afib.  HR in the 120s in office.  EKG abnormal.  Will increase Metoprolol to 100mg  BID.  Follow up in 1 week.  Call Cardiology to be seen sometime next week.

## 2021-08-25 NOTE — Telephone Encounter (Signed)
Copied from CRM [id number]. Topic: Quick Communication - Rx Refill/Question >> Aug 25, 2021 12:08 PM Jaquita Rector A wrote: Medication: predniSONE (DELTASONE) 10 MG tablet   Has the patient contacted their pharmacy? No. No refill  (Agent: If no, request that the patient contact the pharmacy for the refill. If patient does not wish to contact the pharmacy document the reason why and proceed with request.) (Agent: If yes, when and what did the pharmacy advise?)  Preferred Pharmacy (with phone number or street name): [id number] - [place], [place] - [address]  Phone:  [phone] Fax:  [phone]    Has the patient been seen for an appointment in the last year OR does the patient have an upcoming appointment? Yes.    Agent: Please be advised that RX refills may take up to 3 business days. We ask that you follow-up with your pharmacy.

## 2021-08-25 NOTE — Assessment & Plan Note (Addendum)
Observed on chest xray. Breath sounds clear in office. Will repeat check xray. Will treat with prednisone taper.  HR 120s in office today.  Discussed signs and symptoms patient and daughter should monitor for and when to seek higher level of care.  Follow up in 1 week for reevaluation.

## 2021-08-25 NOTE — Telephone Encounter (Signed)
Patient called and she says she has a cold and was just seen in the office earlier, prednisone prescribed.

## 2021-08-25 NOTE — Telephone Encounter (Signed)
°  Chief Complaint: post COVID cough Symptoms: cough, congestion Frequency: since 1/17 Pertinent Negatives: Patient denies fever Disposition: [] ED /[] Urgent Care (no appt availability in office) / [x] Appointment(In office/virtual)/ []  [place] [facility]/ [] Home Care/ [] Refused Recommended Disposition /[]  Mobile Bus/ []  Follow-up with PCP Additional Notes:

## 2021-08-25 NOTE — Patient Instructions (Signed)
Prednisone taper for 12 days- take 6 for 1 days, 5 for 2 days, 4 for 2 days, 3 dor 2 days, 2 for 2 days, and 1 for two days. Augumentin twice a day for 10 days Increase Metoprolol to 100mg  BID  Chest xray on Monday at [facility]  Cardiology [phone] [address], [place], [place]

## 2021-08-25 NOTE — Progress Notes (Signed)
EKG abnormal. Increased Metoprolol to 100mg  BID. Recommend following up with Cardiology.

## 2021-08-25 NOTE — Progress Notes (Signed)
BP 138/84    Pulse (!) 120    Temp 97.7 F (36.5 C) (Oral)    Wt (!) 310 lb 3.2 oz (140.7 kg)    SpO2 98%    BMI 56.74 kg/m    Subjective:    Patient ID: [name], female    DOB: [birth year], 71 y.o.   MRN: [id number]  HPI: [name] is a 71 y.o. female  Chief Complaint  Patient presents with   Cough    Pt states she is still coughing and having issues breathing since being diagnosed with COVID on 08/09/21   Patient's daughter states patient is short of breath, losing balance, coughing, fatigued and not feeling well.  Patient states she is taking her Eliquis and Metoprolol.  She had COVID on 1/20 and took [place] but is still having symptoms.  Patient's check xray showed some CHF and possible pneumonia.    Relevant past medical, surgical, family and social history reviewed and updated as indicated. Interim medical history since our last visit reviewed. Allergies and medications reviewed and updated.  Review of Systems  Constitutional:  Positive for fatigue.  Respiratory:  Positive for cough and shortness of breath.   Neurological:        Off balance   Per HPI unless specifically indicated above     Objective:    BP 138/84    Pulse (!) 120    Temp 97.7 F (36.5 C) (Oral)    Wt (!) 310 lb 3.2 oz (140.7 kg)    SpO2 98%    BMI 56.74 kg/m   Wt Readings from Last 3 Encounters:  08/25/21 (!) 310 lb 3.2 oz (140.7 kg)  07/26/21 (!) 306 lb (138.8 kg)  06/14/21 (!) 308 lb 12.8 oz (140.1 kg)    Physical Exam Vitals and nursing note reviewed.  Constitutional:      General: She is not in acute distress.    Appearance: Normal appearance. She is obese. She is not ill-appearing, toxic-appearing or diaphoretic.  HENT:     Head: Normocephalic.     Right Ear: Tympanic membrane and external ear normal.     Left Ear: Tympanic membrane and external ear normal.     Nose: Congestion present.     Mouth/Throat:     Mouth: Mucous membranes are moist.     Pharynx: Oropharynx  is clear. Posterior oropharyngeal erythema present.  Eyes:     General:        Right eye: No discharge.        Left eye: No discharge.     Extraocular Movements: Extraocular movements intact.     Conjunctiva/sclera: Conjunctivae normal.     Pupils: Pupils are equal, round, and reactive to light.  Cardiovascular:     Rate and Rhythm: Normal rate and regular rhythm.     Heart sounds: No murmur heard. Pulmonary:     Effort: Pulmonary effort is normal. No respiratory distress.     Breath sounds: Normal breath sounds. No wheezing or rales.  Musculoskeletal:     Cervical back: Normal range of motion and neck supple.     Right lower leg: No edema.     Left lower leg: No edema.  Skin:    General: Skin is warm and dry.     Capillary Refill: Capillary refill takes less than 2 seconds.  Neurological:     General: No focal deficit present.     Mental Status: She is alert and oriented to person,  place, and time. Mental status is at baseline.  Psychiatric:        Mood and Affect: Mood normal.        Behavior: Behavior normal.        Thought Content: Thought content normal.        Judgment: Judgment normal.    Results for orders placed or performed in visit on 07/26/21  Comp Met (CMET)  Result Value Ref Range   Glucose 95 70 - 99 mg/dL   BUN 12 8 - 27 mg/dL   Creatinine, Ser 0.86 0.57 - 1.00 mg/dL   eGFR 73 >59 mL/min/1.73   BUN/Creatinine Ratio 14 12 - 28   Sodium 139 134 - 144 mmol/L   Potassium 4.2 3.5 - 5.2 mmol/L   Chloride 103 96 - 106 mmol/L   CO2 23 20 - 29 mmol/L   Calcium 9.1 8.7 - 10.3 mg/dL   Total Protein 6.5 6.0 - 8.5 g/dL   Albumin 4.3 3.8 - 4.8 g/dL   Globulin, Total 2.2 1.5 - 4.5 g/dL   Albumin/Globulin Ratio 2.0 1.2 - 2.2   Bilirubin Total 0.6 0.0 - 1.2 mg/dL   Alkaline Phosphatase 65 44 - 121 IU/L   AST 11 0 - 40 IU/L   ALT 7 0 - 32 IU/L  Lipid Profile  Result Value Ref Range   Cholesterol, Total 144 100 - 199 mg/dL   Triglycerides 73 0 - 149 mg/dL   HDL 51  >39 mg/dL   VLDL Cholesterol Cal 14 5 - 40 mg/dL   LDL Chol Calc (NIH) 79 0 - 99 mg/dL   Chol/HDL Ratio 2.8 0.0 - 4.4 ratio      Assessment & Plan:   Problem List Items Addressed This Visit       Cardiovascular and Mediastinum   Atrial fibrillation ([place]) - Primary    Hx of Afib.  HR in the 120s in office.  EKG abnormal.  Will increase Metoprolol to 146m BID.  Follow up in 1 week.  Call Cardiology to be seen sometime next week.      Relevant Medications   metoprolol succinate (TOPROL-XL) 100 MG 24 hr tablet   (HFpEF) heart failure with preserved ejection fraction ([place]    Observed on chest xray. Breath sounds clear in office. Will repeat check xray. Will treat with prednisone taper.  HR 120s in office today.  Discussed signs and symptoms patient and daughter should monitor for and when to seek higher level of care.  Follow up in 1 week for reevaluation.       Relevant Medications   metoprolol succinate (TOPROL-XL) 100 MG 24 hr tablet     Respiratory   Pneumonia due to COVID-19 virus    Will treat with Augmentin and Prednisone taper. Will repeat chest xray next week.  Discussed signs and symptoms to monitor for and when to see higher level of care. Follow up in 1 week.      Relevant Medications   amoxicillin-clavulanate (AUGMENTIN) 875-125 MG tablet   Other Visit Diagnoses     Tachycardia       Relevant Orders   EKG 12-Lead (Completed)   Shortness of breath       Relevant Orders   DG Chest 2 View        Follow up plan: Return in about 1 week (around 09/01/2021) for Breathing.

## 2021-08-25 NOTE — Telephone Encounter (Signed)
Summary: Cough getting worse   Patient daughter Kattaleia Latshaw called in asking if Jon Billings can prescribe her some more medication because her cough have gotten worst since her Dx of Covid 2 weeks ago. Stated that patient wanted more prednisone please call patient at Ph# [phone]      Reason for Disposition  [1] PERSISTING SYMPTOMS OF COVID-19 AND [2] symptoms WORSE  Answer Assessment - Initial Assessment Questions 1. COVID-19 ONSET: "When did the symptoms of COVID-19 first start?"     1/17 2. DIAGNOSIS CONFIRMATION: "How were you diagnosed?" (e.g., COVID-19 oral or nasal viral test; COVID-19 antibody test; doctor visit)     1/18 3. MAIN SYMPTOM:  "What is your main concern or symptom right now?" (e.g., breathing difficulty, cough, fatigue. loss of smell)     cough 4. SYMPTOM ONSET: "When did the  cough  start?"     1/17 5. BETTER-SAME-WORSE: "Are you getting better, staying the same, or getting worse over the last 1 to 2 weeks?"     Worse- cough feels worse- using OTC but not clearing, using inhaler- not helping 6. RECENT MEDICAL VISIT: "Have you been seen by a healthcare provider (doctor, NP, PA) for these persisting COVID-19 symptoms?" If Yes, ask: "When were you seen?" (e.g., date)     Patient was given prednisone and antiviral treatment- did not get better 7. COUGH: "Do you have a cough?" If Yes, ask: "How bad is the cough?"       Yes- dry cough 8. FEVER: "Do you have a fever?" If Yes, ask: "What is your temperature, how was it measured, and when did it start?"     no 9. BREATHING DIFFICULTY: "Are you having any trouble breathing?" If Yes, ask: "How bad is your breathing?" (e.g., mild, moderate, severe)    - MILD: No SOB at rest, mild SOB with walking, speaks normally in sentences, can lie down, no retractions, pulse < 100.    - MODERATE: SOB at rest, SOB with minimal exertion and prefers to sit, cannot lie down flat, speaks in phrases, mild retractions, audible wheezing,  pulse 100-120.    - SEVERE: Very SOB at rest, speaks in single words, struggling to breathe, sitting hunched forward, retractions, pulse > 120       mild 10. HIGH RISK DISEASE: "Do you have any chronic medical problems?" (e.g., asthma, heart or lung disease, weak immune system, obesity, etc.)       Both- COPD, asthma 11. VACCINE: "Have you gotten the COVID-19 vaccine?" If Yes, ask: "Which one, how many shots, when did you get it?"       no 12. BOOSTER: "Have you received your COVID-19 booster?" If Yes, ask: "Which one and when did you get it?"       no 13. PREGNANCY: "Is there any chance you are pregnant?" "When was your last menstrual period?"       na 14. OTHER SYMPTOMS: "Do you have any other symptoms?"  (e.g., fatigue, headache, muscle pain, weakness)       congestion 15. O2 SATURATION MONITOR:  "Do you use an oxygen saturation monitor (pulse oximeter) at home?" If Yes, ask "What is your reading (oxygen level) today?" "What is your usual oxygen saturation reading?" (e.g., 95%)       no  Protocols used: Coronavirus (COVID-19) Persisting Symptoms Follow-up Call-[facility]

## 2021-08-28 ENCOUNTER — Other Ambulatory Visit: Payer: Self-pay

## 2021-08-28 ENCOUNTER — Ambulatory Visit
Admission: RE | Admit: 2021-08-28 | Discharge: 2021-08-28 | Disposition: A | Discharge status: Home / Self Care | Payer: Medicare HMO | Attending: Nurse Practitioner | Admitting: Nurse Practitioner

## 2021-08-28 ENCOUNTER — Ambulatory Visit
Admission: RE | Admit: 2021-08-28 | Discharge: 2021-08-28 | Disposition: A | Discharge status: Home / Self Care | Payer: Medicare HMO | Source: Ambulatory Visit | Attending: Nurse Practitioner | Admitting: Nurse Practitioner

## 2021-08-28 DIAGNOSIS — R0602 Shortness of breath: Secondary | ICD-10-CM | POA: Insufficient documentation

## 2021-08-28 DIAGNOSIS — I700 Atherosclerosis of aorta: Secondary | ICD-10-CM | POA: Diagnosis not present

## 2021-08-29 NOTE — Progress Notes (Signed)
Please let patient know that her xray is similar to prior showing some fluid or possible pneumonia. She is being treated for both already.  I recommend she see Cardiology sooner than her next appt.

## 2021-09-04 ENCOUNTER — Ambulatory Visit (INDEPENDENT_AMBULATORY_CARE_PROVIDER_SITE_OTHER): Payer: Medicare HMO | Admitting: Nurse Practitioner

## 2021-09-04 ENCOUNTER — Encounter: Payer: Self-pay | Admitting: Nurse Practitioner

## 2021-09-04 ENCOUNTER — Other Ambulatory Visit: Payer: Self-pay

## 2021-09-04 VITALS — BP 149/74 | HR 74 | Temp 97.7°F | Wt 314.6 lb

## 2021-09-04 DIAGNOSIS — R0602 Shortness of breath: Secondary | ICD-10-CM

## 2021-09-04 DIAGNOSIS — J9811 Atelectasis: Secondary | ICD-10-CM | POA: Diagnosis not present

## 2021-09-04 MED ORDER — ALBUTEROL SULFATE (2.5 MG/3ML) 0.083% IN NEBU
2.5000 mg | INHALATION_SOLUTION | Freq: Once | RESPIRATORY_TRACT | Status: AC
  Administered 2021-09-04: 2.5 mg via RESPIRATORY_TRACT

## 2021-09-04 MED ORDER — FUROSEMIDE 20 MG PO TABS: 20.0000 mg | ORAL_TABLET | Freq: Every day | ORAL | 0 refills | Status: DC

## 2021-09-04 NOTE — Assessment & Plan Note (Signed)
Ongoing SOB since COVID. Unknown etiology. Did not improve with prednisone and antibiotics.  Patient is not a smoker but has been exposed to smoking in her home for many years. Spirometry done in office and SOB does not appear related to COPD or Asthma. Will treat with Lasix daily x 14 days to see if SOB improves. If not improved could be due to body habitus. Keep appt with Cardiology for March 8.  If not cardiac related will send to Pulmonology.  Follow up in 2 weeks for reevaluation.

## 2021-09-04 NOTE — Progress Notes (Signed)
BP (!) 149/74 (BP Location: Left Arm, Cuff Size: Large)    Pulse 74    Temp 97.7 F (36.5 C) (Oral)    Wt (!) 314 lb 9.6 oz (142.7 kg)    SpO2 98%    BMI 57.54 kg/m    Subjective:    Patient ID: [name], female    DOB: [birth year], 71 y.o.   MRN: [id number]  HPI: [name] is a 71 y.o. female  Chief Complaint  Patient presents with   Atrial Fibrillation    1 week f/up    Patient presents to clinic to follow up on her breathing from last week.  Patient has started Metoprolol 138m daily.  She has an appt on March 8 for Cardiology. She continues to have SOB.  She has SOB with walking, sitting, and any type of activity.    Patient's daughter states patient is short of breath, losing balance, coughing, fatigued and not feeling well.  Patient states she is taking her Eliquis and Metoprolol.    Relevant past medical, surgical, family and social history reviewed and updated as indicated. Interim medical history since our last visit reviewed. Allergies and medications reviewed and updated.  Review of Systems  Respiratory:  Positive for cough and shortness of breath.   Neurological:        Off balance   Per HPI unless specifically indicated above     Objective:    BP (!) 149/74 (BP Location: Left Arm, Cuff Size: Large)    Pulse 74    Temp 97.7 F (36.5 C) (Oral)    Wt (!) 314 lb 9.6 oz (142.7 kg)    SpO2 98%    BMI 57.54 kg/m   Wt Readings from Last 3 Encounters:  09/04/21 (!) 314 lb 9.6 oz (142.7 kg)  08/25/21 (!) 310 lb 3.2 oz (140.7 kg)  07/26/21 (!) 306 lb (138.8 kg)    Physical Exam Vitals and nursing note reviewed.  Constitutional:      General: She is not in acute distress.    Appearance: Normal appearance. She is obese. She is not ill-appearing, toxic-appearing or diaphoretic.  HENT:     Head: Normocephalic.     Right Ear: Tympanic membrane and external ear normal.     Left Ear: Tympanic membrane and external ear normal.     Nose: No congestion.      Mouth/Throat:     Mouth: Mucous membranes are moist.     Pharynx: Oropharynx is clear. No posterior oropharyngeal erythema.  Eyes:     General:        Right eye: No discharge.        Left eye: No discharge.     Extraocular Movements: Extraocular movements intact.     Conjunctiva/sclera: Conjunctivae normal.     Pupils: Pupils are equal, round, and reactive to light.  Cardiovascular:     Rate and Rhythm: Normal rate and regular rhythm.     Heart sounds: No murmur heard. Pulmonary:     Effort: Pulmonary effort is normal. No respiratory distress.     Breath sounds: Normal breath sounds. Decreased air movement present. No wheezing or rales.  Musculoskeletal:     Cervical back: Normal range of motion and neck supple.     Right lower leg: No edema.     Left lower leg: No edema.  Skin:    General: Skin is warm and dry.     Capillary Refill: Capillary refill takes less than  2 seconds.  Neurological:     General: No focal deficit present.     Mental Status: She is alert and oriented to person, place, and time. Mental status is at baseline.  Psychiatric:        Mood and Affect: Mood normal.        Behavior: Behavior normal.        Thought Content: Thought content normal.        Judgment: Judgment normal.    Results for orders placed or performed in visit on 07/26/21  Comp Met (CMET)  Result Value Ref Range   Glucose 95 70 - 99 mg/dL   BUN 12 8 - 27 mg/dL   Creatinine, Ser 0.86 0.57 - 1.00 mg/dL   eGFR 73 >59 mL/min/1.73   BUN/Creatinine Ratio 14 12 - 28   Sodium 139 134 - 144 mmol/L   Potassium 4.2 3.5 - 5.2 mmol/L   Chloride 103 96 - 106 mmol/L   CO2 23 20 - 29 mmol/L   Calcium 9.1 8.7 - 10.3 mg/dL   Total Protein 6.5 6.0 - 8.5 g/dL   Albumin 4.3 3.8 - 4.8 g/dL   Globulin, Total 2.2 1.5 - 4.5 g/dL   Albumin/Globulin Ratio 2.0 1.2 - 2.2   Bilirubin Total 0.6 0.0 - 1.2 mg/dL   Alkaline Phosphatase 65 44 - 121 IU/L   AST 11 0 - 40 IU/L   ALT 7 0 - 32 IU/L  Lipid Profile   Result Value Ref Range   Cholesterol, Total 144 100 - 199 mg/dL   Triglycerides 73 0 - 149 mg/dL   HDL 51 >39 mg/dL   VLDL Cholesterol Cal 14 5 - 40 mg/dL   LDL Chol Calc (NIH) 79 0 - 99 mg/dL   Chol/HDL Ratio 2.8 0.0 - 4.4 ratio      Assessment & Plan:   Problem List Items Addressed This Visit       Respiratory   Atelectasis - Primary    Ongoing SOB since COVID. Unknown etiology. Did not improve with prednisone and antibiotics.  Patient is not a smoker but has been exposed to smoking in her home for many years. Spirometry done in office and SOB does not appear related to COPD or Asthma. Will treat with Lasix daily x 14 days to see if SOB improves. If not improved could be due to body habitus. Keep appt with Cardiology for March 8.  If not cardiac related will send to Pulmonology.  Follow up in 2 weeks for reevaluation.      Other Visit Diagnoses     SOB (shortness of breath)       Relevant Medications   albuterol (PROVENTIL) (2.5 MG/3ML) 0.083% nebulizer solution 2.5 mg (Completed)   Other Relevant Orders   Spirometry with graph (Completed)        Follow up plan: Return in about 2 weeks (around 09/18/2021) for Breathing check up.

## 2021-09-04 NOTE — Progress Notes (Signed)
Results discussed with patient during visit. Does not show COPD.

## 2021-09-19 ENCOUNTER — Encounter: Payer: Self-pay | Admitting: Nurse Practitioner

## 2021-09-19 ENCOUNTER — Other Ambulatory Visit: Payer: Self-pay

## 2021-09-19 ENCOUNTER — Ambulatory Visit (INDEPENDENT_AMBULATORY_CARE_PROVIDER_SITE_OTHER): Payer: Medicare HMO | Admitting: Nurse Practitioner

## 2021-09-19 VITALS — BP 131/61 | HR 68 | Temp 97.4°F | Wt 309.6 lb

## 2021-09-19 DIAGNOSIS — I10 Essential (primary) hypertension: Secondary | ICD-10-CM | POA: Diagnosis not present

## 2021-09-19 DIAGNOSIS — F4321 Adjustment disorder with depressed mood: Secondary | ICD-10-CM | POA: Diagnosis not present

## 2021-09-19 DIAGNOSIS — I5030 Unspecified diastolic (congestive) heart failure: Secondary | ICD-10-CM

## 2021-09-19 DIAGNOSIS — R0602 Shortness of breath: Secondary | ICD-10-CM

## 2021-09-19 DIAGNOSIS — M1711 Unilateral primary osteoarthritis, right knee: Secondary | ICD-10-CM

## 2021-09-19 DIAGNOSIS — I4819 Other persistent atrial fibrillation: Secondary | ICD-10-CM

## 2021-09-19 DIAGNOSIS — R69 Illness, unspecified: Secondary | ICD-10-CM | POA: Diagnosis not present

## 2021-09-19 NOTE — Progress Notes (Signed)
BP 131/61 (BP Location: Left Arm, Cuff Size: Normal)    Pulse 68    Temp (!) 97.4 F (36.3 C) (Oral)    Wt (!) 309 lb 9.6 oz (140.4 kg)    SpO2 97%    BMI 56.63 kg/m    Subjective:    Patient ID: [name], female    DOB: [birth year], 71 y.o.   MRN: [id number]  HPI: [name] is a 71 y.o. female  Chief Complaint  Patient presents with   Breathing Problem    2 week f/up- pt states her breathing is about the same as last time    Patient presents to clinic to follow up on her breathing.  She was here 2 weeks ago.  She states she is breathing better until she gets up and moves around.  She has an appointment with Cardiology next week.  Her SOB at rest has improved but still has SOB with Walking, sitting, and any type of activity. Fatigue is also improving.    Relevant past medical, surgical, family and social history reviewed and updated as indicated. Interim medical history since our last visit reviewed. Allergies and medications reviewed and updated.  Review of Systems  Constitutional:  Negative for fatigue.  Respiratory:  Positive for cough and shortness of breath.    Per HPI unless specifically indicated above     Objective:    BP 131/61 (BP Location: Left Arm, Cuff Size: Normal)    Pulse 68    Temp (!) 97.4 F (36.3 C) (Oral)    Wt (!) 309 lb 9.6 oz (140.4 kg)    SpO2 97%    BMI 56.63 kg/m   Wt Readings from Last 3 Encounters:  09/19/21 (!) 309 lb 9.6 oz (140.4 kg)  09/04/21 (!) 314 lb 9.6 oz (142.7 kg)  08/25/21 (!) 310 lb 3.2 oz (140.7 kg)    Physical Exam Vitals and nursing note reviewed.  Constitutional:      General: She is not in acute distress.    Appearance: Normal appearance. She is obese. She is not ill-appearing, toxic-appearing or diaphoretic.  HENT:     Head: Normocephalic.     Right Ear: Tympanic membrane and external ear normal.     Left Ear: Tympanic membrane and external ear normal.     Nose: No congestion.     Mouth/Throat:      Mouth: Mucous membranes are moist.     Pharynx: Oropharynx is clear. No posterior oropharyngeal erythema.  Eyes:     General:        Right eye: No discharge.        Left eye: No discharge.     Extraocular Movements: Extraocular movements intact.     Conjunctiva/sclera: Conjunctivae normal.     Pupils: Pupils are equal, round, and reactive to light.  Cardiovascular:     Rate and Rhythm: Normal rate and regular rhythm.     Heart sounds: No murmur heard. Pulmonary:     Effort: Pulmonary effort is normal. No respiratory distress.     Breath sounds: Normal breath sounds. Decreased air movement present. No wheezing or rales.  Musculoskeletal:     Cervical back: Normal range of motion and neck supple.     Right lower leg: No edema.     Left lower leg: No edema.  Skin:    General: Skin is warm and dry.     Capillary Refill: Capillary refill takes less than 2 seconds.  Neurological:  General: No focal deficit present.     Mental Status: She is alert and oriented to person, place, and time. Mental status is at baseline.  Psychiatric:        Mood and Affect: Mood normal.        Behavior: Behavior normal.        Thought Content: Thought content normal.        Judgment: Judgment normal.    Results for orders placed or performed in visit on 07/26/21  Comp Met (CMET)  Result Value Ref Range   Glucose 95 70 - 99 mg/dL   BUN 12 8 - 27 mg/dL   Creatinine, Ser 0.86 0.57 - 1.00 mg/dL   eGFR 73 >59 mL/min/1.73   BUN/Creatinine Ratio 14 12 - 28   Sodium 139 134 - 144 mmol/L   Potassium 4.2 3.5 - 5.2 mmol/L   Chloride 103 96 - 106 mmol/L   CO2 23 20 - 29 mmol/L   Calcium 9.1 8.7 - 10.3 mg/dL   Total Protein 6.5 6.0 - 8.5 g/dL   Albumin 4.3 3.8 - 4.8 g/dL   Globulin, Total 2.2 1.5 - 4.5 g/dL   Albumin/Globulin Ratio 2.0 1.2 - 2.2   Bilirubin Total 0.6 0.0 - 1.2 mg/dL   Alkaline Phosphatase 65 44 - 121 IU/L   AST 11 0 - 40 IU/L   ALT 7 0 - 32 IU/L  Lipid Profile  Result Value Ref  Range   Cholesterol, Total 144 100 - 199 mg/dL   Triglycerides 73 0 - 149 mg/dL   HDL 51 >39 mg/dL   VLDL Cholesterol Cal 14 5 - 40 mg/dL   LDL Chol Calc (NIH) 79 0 - 99 mg/dL   Chol/HDL Ratio 2.8 0.0 - 4.4 ratio      Assessment & Plan:   Problem List Items Addressed This Visit       Cardiovascular and Mediastinum   (HFpEF) heart failure with preserved ejection fraction ([facility]) - Primary    Chronic. Patient has follow up with Cardiology on March 8.  Discussed the importance of keeping appointment with patient today.       Other Visit Diagnoses     Shortness of breath       Improving. Will need to do spirometry at next visit.  Will need to evaluate for COPD and discuss treatment options.        Follow up plan: Return if symptoms worsen or fail to improve.

## 2021-09-19 NOTE — Assessment & Plan Note (Signed)
Chronic. Patient has follow up with Cardiology on March 8.  Discussed the importance of keeping appointment with patient today.

## 2021-09-27 ENCOUNTER — Ambulatory Visit: Payer: Medicare HMO | Admitting: Cardiology

## 2021-09-27 ENCOUNTER — Other Ambulatory Visit: Payer: Self-pay

## 2021-09-27 ENCOUNTER — Encounter: Payer: Self-pay | Admitting: Cardiology

## 2021-09-27 VITALS — BP 130/80 | HR 90 | Ht 65.0 in | Wt 313.0 lb

## 2021-09-27 DIAGNOSIS — I4821 Permanent atrial fibrillation: Secondary | ICD-10-CM

## 2021-09-27 DIAGNOSIS — I5030 Unspecified diastolic (congestive) heart failure: Secondary | ICD-10-CM

## 2021-09-27 MED ORDER — TORSEMIDE 20 MG PO TABS: ORAL_TABLET | ORAL | 3 refills | Status: DC

## 2021-09-27 NOTE — Progress Notes (Signed)
?Electrophysiology Office Follow up Visit Note:   ? ?Date:  09/27/2021  ? ?ID:  [name], DOB 1950/09/21, MRN [id number] ? ?PCP:  Larae Grooms, NP  ?[facility] Cardiologist:  None  ?[facility] Electrophysiologist:  Lanier Prude, MD  ? ? ?Interval History:   ? ?[name] is a 71 y.o. female who presents for a follow up visit. They were last seen in clinic June 14, 2021 for atrial fibrillation.  She also has a history of chronic diastolic heart failure.  At that visit she was in poorly controlled atrial fibrillation with rapid ventricular rates.  We increased her metoprolol to 50 mg by mouth twice daily.  I did not think she was a candidate for catheter ablation. ? ?She is with her family today in clinic.  She tells me that her shortness of breath persists.  She think she is holding onto fluid in her lungs.  She also has some edema in her legs.  She can tell that her heart rates have improved since taking the increased dose of metoprolol.  She tells me that she does not notice much difference in urine output with Lasix. ? ? ? ?  ? ?Past Medical History:  ?Diagnosis Date  ? History of shingles   ? Knee pain   ? Migraines   ? ? ?Past Surgical History:  ?Procedure Laterality Date  ? COLONOSCOPY WITH PROPOFOL N/A 02/19/2020  ? Procedure: COLONOSCOPY WITH PROPOFOL;  Surgeon: Pasty Spillers, MD;  Location: [facility] ENDOSCOPY;  Service: Endoscopy;  Laterality: N/A;  ? REPLACEMENT TOTAL KNEE    ? left knee  ? TUBAL LIGATION    ? ? ?Current Medications: ?Current Meds  ?Medication Sig  ? Acetaminophen (TYLENOL 8 HOUR PO) Take by mouth as needed.  ? albuterol (VENTOLIN HFA) 108 (90 Base) MCG/ACT inhaler Inhale 1-2 puffs into the lungs every 6 (six) hours as needed for wheezing or shortness of breath.  ? apixaban (ELIQUIS) 5 MG TABS tablet Take 1 tablet (5 mg total) by mouth 2 (two) times daily.  ? gabapentin (NEURONTIN) 600 MG tablet Take 1 tablet (600 mg total) by mouth 3 (three) times daily as  needed.  ? metoprolol succinate (TOPROL-XL) 100 MG 24 hr tablet Take 1 tablet (100 mg total) by mouth in the morning and at bedtime. Take with or immediately following a meal.  ? Spacer/Aero-Holding Chambers [facility] DIAMOND) MISC by Does not apply route.  ?  ? ?Allergies:   Patient has no known allergies.  ? ?Social History  ? ?Socioeconomic History  ? Marital status: Widowed  ?  Spouse name: Not on file  ? Number of children: Not on file  ? Years of education: Not on file  ? Highest education level: Not on file  ?Occupational History  ? Occupation: retired  ?Tobacco Use  ? Smoking status: Never  ? Smokeless tobacco: Never  ?Vaping Use  ? Vaping Use: Never used  ?Substance and Sexual Activity  ? Alcohol use: No  ? Drug use: No  ? Sexual activity: Not Currently  ?Other Topics Concern  ? Not on file  ?Social History Narrative  ? Not on file  ? ?Social Determinants of Health  ? ?Financial Resource Strain: Low Risk   ? Difficulty of Paying Living Expenses: Not hard at all  ?Food Insecurity: No Food Insecurity  ? Worried About Programme researcher, broadcasting/film/video in the Last Year: Never true  ? Ran Out of Food in the Last Year: Never  true  ?Transportation Needs: No Transportation Needs  ? Lack of Transportation (Medical): No  ? Lack of Transportation (Non-Medical): No  ?Physical Activity: Inactive  ? Days of Exercise per Week: 0 days  ? Minutes of Exercise per Session: 0 min  ?Stress: No Stress Concern Present  ? Feeling of Stress : Not at all  ?Social Connections: Socially Isolated  ? Frequency of Communication with Friends and Family: More than three times a week  ? Frequency of Social Gatherings with Friends and Family: More than three times a week  ? Attends Religious Services: Never  ? Active Member of Clubs or Organizations: No  ? Attends Banker Meetings: Never  ? Marital Status: Widowed  ?  ? ?Family History: ?The patient's family history includes Cancer in her brother and father; Heart disease in her mother;  Hypertension in her mother. ? ?ROS:   ?Please see the history of present illness.    ?All other systems reviewed and are negative. ? ?EKGs/Labs/Other Studies Reviewed:   ? ?The following studies were reviewed today: ? ? ?EKG:  The ekg ordered today demonstrates atrial fibrillation with a ventricular rate of 90 bpm. ? ?Recent Labs: ?05/27/2021: Hemoglobin 13.4; Platelets 186 ?07/26/2021: ALT 7; BUN 12; Creatinine, Ser 0.86; Potassium 4.2; Sodium 139  ?Recent Lipid Panel ?   ?Component Value Date/Time  ? CHOL 144 07/26/2021 1412  ? TRIG 73 07/26/2021 1412  ? HDL 51 07/26/2021 1412  ? CHOLHDL 2.8 07/26/2021 1412  ? LDLCALC 79 07/26/2021 1412  ? ? ?Physical Exam:   ? ?VS:  BP 130/80 (BP Location: Right Arm, Patient Position: Sitting, Cuff Size: Large)   Pulse 90   Ht 5\' 5"  (1.651 m)   Wt (!) 313 lb (142 kg)   SpO2 97%   BMI 52.09 kg/m?    ? ?Wt Readings from Last 3 Encounters:  ?09/27/21 (!) 313 lb (142 kg)  ?09/19/21 (!) 309 lb 9.6 oz (140.4 kg)  ?09/04/21 (!) 314 lb 9.6 oz (142.7 kg)  ?  ? ?GEN:  Well nourished, well developed in no acute distress ?HEENT: Left eye with erythema.  Otherwise normal. ?NECK: No JVD; No carotid bruits ?LYMPHATICS: No lymphadenopathy ?CARDIAC: Irregularly irregular, no murmurs, rubs, gallops ?RESPIRATORY:  Clear to auscultation without rales, wheezing or rhonchi  ?ABDOMEN: Soft, non-tender, non-distended ?MUSCULOSKELETAL:  No edema; No deformity  ?SKIN: Warm and dry ?NEUROLOGIC:  Alert and oriented x 3 ?PSYCHIATRIC:  Normal affect  ? ? ? ?  ? ?ASSESSMENT:   ? ?1. Permanent atrial fibrillation (HCC)   ?2. Heart failure with preserved ejection fraction, unspecified HF chronicity (HCC)   ?3. Morbid obesity due to excess calories (HCC)   ? ?PLAN:   ? ?In order of problems listed above: ? ? ?#Permanent atrial fibrillation ?Rate control is better now on the increased metoprolol dose.  Continue Eliquis 5 mg by mouth twice daily for stroke prophylaxis. ? ? ?#Chronic diastolic heart failure ?NYHA  class III.  Volume up on exam today.  The Lasix is not augmenting diuresis very much.  I suspect there is some element of gut edema that is preventing absorption.  I will transition her to torsemide 20 mg by mouth once daily.  I will have her touch base with an APP in about 2 weeks to reassess her volume status and symptoms.  She will also need lab work at that visit. ? ?Follow-up with EP on an as-needed basis. ? ? ?Medication Adjustments/Labs and Tests Ordered: ?Current medicines  are reviewed at length with the patient today.  Concerns regarding medicines are outlined above.  ?No orders of the defined types were placed in this encounter. ? ?No orders of the defined types were placed in this encounter. ? ? ? ?Signed, ?Steffanie Dunnameron Devin Ganaway, MD, [facility]ACC, FHRS ?09/27/2021 9:52 AM    ?Electrophysiology ?[facility] ?

## 2021-09-27 NOTE — Patient Instructions (Signed)
Medication Instructions:  ?- Your physician has recommended you make the following change in your medication:  ? ?1) STOP Lasix (Furosemide) ? ?2) START Demadex (Torsemide) 20 mg: ?- take 1 tablet by mouth once daily  ? ?*If you need a refill on your cardiac medications before your next appointment, please call your pharmacy* ? ? ?Lab Work: ?- none ordered ? ?If you have labs (blood work) drawn today and your tests are completely normal, you will receive your results only by: ?MyChart Message (if you have MyChart) OR ?A paper copy in the mail ?If you have any lab test that is abnormal or we need to change your treatment, we will call you to review the results. ? ? ?Testing/Procedures: ?- none ordered ? ? ?Follow-Up: ?At [facility], you and your health needs are our priority.  As part of our continuing mission to provide you with exceptional heart care, we have created designated Provider Care Teams.  These Care Teams include your primary Cardiologist (physician) and Advanced Practice Providers (APPs -  Physician Assistants and Nurse Practitioners) who all work together to provide you with the care you need, when you need it. ? ?We recommend signing up for the patient portal called "MyChart".  Sign up information is provided on this After Visit Summary.  MyChart is used to connect with patients for Virtual Visits (Telemedicine).  Patients are able to view lab/test results, encounter notes, upcoming appointments, etc.  Non-urgent messages can be sent to your provider as well.   ?To learn more about what you can do with MyChart, go to [email or web address].   ? ?Your next appointment:   ?1) 2 week(s) with Dr. Lovena Neighbours PA/ NP ? ?2) As needed with Dr. Lalla Brothers ? ?The format for your next appointment:   ?In Person ? ?Provider:   ?As above   ? ? ?Other Instructions ? ?Torsemide Oral Tablets ?What is this medication? ?TORSEMIDE (TORE se mide) is a diuretic. It helps you make more urine and lose salt and water from  your body. It treats swelling from heart, kidney, or liver disease. It also treats high blood pressure. ?This medicine may be used for other purposes; ask your health care provider or pharmacist if you have questions. ?COMMON BRAND NAME(S): Demadex, SOAANZ ?What should I tell my care team before I take this medication? ?They need to know if you have any of these conditions: ?high or low levels of electrolytes, like magnesium, potassium, and sodium, in your blood ?diabetes ?gout ?kidney disease ?liver disease ?an unusual or allergic reaction to torsemide, povidone, other medicines, foods, dyes, or preservatives ?pregnant or trying to get pregnant ?breast-feeding ?How should I use this medication? ?Take this drug by mouth with water. Take it as directed on the prescription label at the same time every day. Keep taking it unless your health care provider tells you to stop. ?Talk to your health care provider about the use of this drug in children. Special care may be needed. ?Overdosage: If you think you have taken too much of this medicine contact a poison control center or emergency room at once. ?NOTE: This medicine is only for you. Do not share this medicine with others. ?What if I miss a dose? ?If you miss a dose, take it as soon as you can. If it is almost time for your next dose, take only that dose. Do not take double or extra doses. ?What may interact with this medication? ?alcohol ?aspirin and aspirin-like medicines ?celecoxib ?certain medicines for blood  pressure, heart disease, irregular heartbeat ?certain medicines for cholesterol like cholestyramine ?certain medicines for diabetes ?cisplatin ?cyclosporine ?ephedra ?ginseng ?lithium ?medicines for infection like acyclovir, adefovir, amphotericin B, bacitracin, cidofovir, foscarnet, ganciclovir, gentamicin, pentamidine, vancomycin ?medicines that relax muscles for surgery ?NSAIDs, medicines for pain and inflammation, like ibuprofen or naproxen ?other  diuretics ?pamidronate ?probenecid ?rifampin ?steroid medicines like prednisone or cortisone ?warfarin ?zoledronic acid ?This list may not describe all possible interactions. Give your health care provider a list of all the medicines, herbs, non-prescription drugs, or dietary supplements you use. Also tell them if you smoke, drink alcohol, or use illegal drugs. Some items may interact with your medicine. ?What should I watch for while using this medication? ?Visit your health care provider for regular checks on your progress. Tell your health care provider if your symptoms do not start to get better or if they get worse. Check your blood pressure regularly. Ask your health care provider what your blood pressure should be. Also, find out when you should contact him or her. You may need blood work done while you are taking this drug. ?Do not treat yourself for coughs, colds, or pain while using this drug without asking your health care provider for advice. Some drugs may increase your blood pressure. ?This drug may increase blood sugar. Ask your health care provider if changes in diet or drugs are needed if you have diabetes. ?You may need to be on a special diet while you are taking this drug. Ask your health care provider. Also, find out how many glasses of fluids you need to drink each day. ?Check with your health care provider if you have severe diarrhea, nausea, and vomiting, or if you sweat a lot. The loss of too much body fluid may make it dangerous for you to take this drug. ?You may get drowsy or dizzy. Do not drive, use machinery, or do anything that needs mental alertness until you know how this drug affects you. Do not stand or sit up quickly, especially if you are an older patient. This reduces the risk of dizzy or fainting spells. Alcohol may interfere with the effects of this drug. Avoid alcoholic drinks. ?What side effects may I notice from receiving this medication? ?Side effects that you should  report to your doctor or health care professional as soon as possible: ?allergic reactions (skin rash, itching or hives; swelling of the face, lips, or tongue) ?decreased hearing, ringing in the ears ?electrolyte imbalance (increased thirst; loss of appetite; severe diarrhea; unusual sweating; vomiting) ?kidney injury (trouble passing urine or change in the amount of urine) ?low potassium (trouble breathing, chest pain; dizziness; fast, irregular heartbeat; feeling faints or lightheaded, falls; muscle cramps or pain) ?Side effects that usually do not require medical attention (report to your doctor or health care professional if they continue or are bothersome): ?passing large amounts of urine ?stomach pain ?This list may not describe all possible side effects. Call your doctor for medical advice about side effects. You may report side effects to FDA at 1-800-FDA-1088. ?Where should I keep my medication? ?Keep out of the reach of children and pets. ?Store at room temperature between 15 and 30 degrees C (59 and 86 degrees F). Do not freeze. Throw away any unused drug after the expiration date. ?NOTE: This sheet is a summary. It may not cover all possible information. If you have questions about this medicine, talk to your doctor, pharmacist, or health care provider. ?? 2022 Elsevier/Gold Standard (2021-03-28 00:00:00) ? ? ?

## 2021-10-10 ENCOUNTER — Ambulatory Visit: Payer: Medicare HMO | Admitting: Medical

## 2021-10-10 ENCOUNTER — Other Ambulatory Visit: Payer: Self-pay

## 2021-10-10 ENCOUNTER — Encounter: Payer: Self-pay | Admitting: Medical

## 2021-10-10 VITALS — BP 124/74 | HR 80 | Ht 63.0 in | Wt 304.0 lb

## 2021-10-10 DIAGNOSIS — I4821 Permanent atrial fibrillation: Secondary | ICD-10-CM | POA: Diagnosis not present

## 2021-10-10 DIAGNOSIS — I5030 Unspecified diastolic (congestive) heart failure: Secondary | ICD-10-CM

## 2021-10-10 DIAGNOSIS — R0609 Other forms of dyspnea: Secondary | ICD-10-CM | POA: Diagnosis not present

## 2021-10-10 NOTE — Progress Notes (Signed)
?Cardiology Office Note:   ? ?Date:  10/10/2021  ? ?ID:  [name], DOB [birth year], MRN [id number] ? ?PCP:  Larae Grooms, NP  ?[facility] Cardiologist:  None  ?[facility] Electrophysiologist:  Lanier Prude, MD  ? ?Referring MD: Larae Grooms, NP  ? ?Chief Complaint: 2 week follow-up ? ?History of Present Illness:   ? ?[name] is a 71 y.o. female with a hx of atrial fibrillation, chronic diastolic heart failure who presents for 2 week follow-up.  ? ?Seen by EP 09/27/21. BB previously increased for elevated rates. Not a candidate for catheter ablation. Had persistent SOB she was transitioned to Torsemide 20mg  daily.  ? ?Today, the patient reports she has been doing better. Breathing is better, but not back to normal. Seems breathing is better on Torsemide. She is not a smoker, no COPD history. She still has DOE.  ? ?Past Medical History:  ?Diagnosis Date  ? History of shingles   ? Knee pain   ? Migraines   ? ? ?Past Surgical History:  ?Procedure Laterality Date  ? COLONOSCOPY WITH PROPOFOL N/A 02/19/2020  ? Procedure: COLONOSCOPY WITH PROPOFOL;  Surgeon: 02/21/2020, MD;  Location: [facility] ENDOSCOPY;  Service: Endoscopy;  Laterality: N/A;  ? REPLACEMENT TOTAL KNEE    ? left knee  ? TUBAL LIGATION    ? ? ?Current Medications: ?Current Meds  ?Medication Sig  ? Acetaminophen (TYLENOL 8 HOUR PO) Take by mouth as needed.  ? albuterol (VENTOLIN HFA) 108 (90 Base) MCG/ACT inhaler Inhale 1-2 puffs into the lungs every 6 (six) hours as needed for wheezing or shortness of breath.  ? apixaban (ELIQUIS) 5 MG TABS tablet Take 1 tablet (5 mg total) by mouth 2 (two) times daily.  ? gabapentin (NEURONTIN) 600 MG tablet Take 1 tablet (600 mg total) by mouth 3 (three) times daily as needed.  ? metoprolol succinate (TOPROL-XL) 100 MG 24 hr tablet Take 1 tablet (100 mg total) by mouth in the morning and at bedtime. Take with or immediately following a meal.  ? ondansetron (ZOFRAN) 4 MG tablet Take 1  tablet (4 mg total) by mouth every 8 (eight) hours as needed for nausea or vomiting.  ? Spacer/Aero-Holding Chambers [facility] DIAMOND) MISC by Does not apply route.  ? torsemide (DEMADEX) 20 MG tablet Take 1 tablet (20 mg) by mouth once daily  ?  ? ?Allergies:   Patient has no known allergies.  ? ?Social History  ? ?Socioeconomic History  ? Marital status: Widowed  ?  Spouse name: Not on file  ? Number of children: Not on file  ? Years of education: Not on file  ? Highest education level: Not on file  ?Occupational History  ? Occupation: retired  ?Tobacco Use  ? Smoking status: Never  ? Smokeless tobacco: Never  ?Vaping Use  ? Vaping Use: Never used  ?Substance and Sexual Activity  ? Alcohol use: No  ? Drug use: No  ? Sexual activity: Not Currently  ?Other Topics Concern  ? Not on file  ?Social History Narrative  ? Not on file  ? ?Social Determinants of Health  ? ?Financial Resource Strain: Low Risk   ? Difficulty of Paying Living Expenses: Not hard at all  ?Food Insecurity: No Food Insecurity  ? Worried About [facility] in the Last Year: Never true  ? Ran Out of Food in the Last Year: Never true  ?Transportation Needs: No Transportation Needs  ? Lack of Transportation (  Medical): No  ? Lack of Transportation (Non-Medical): No  ?Physical Activity: Inactive  ? Days of Exercise per Week: 0 days  ? Minutes of Exercise per Session: 0 min  ?Stress: No Stress Concern Present  ? Feeling of Stress : Not at all  ?Social Connections: Socially Isolated  ? Frequency of Communication with Friends and Family: More than three times a week  ? Frequency of Social Gatherings with Friends and Family: More than three times a week  ? Attends Religious Services: Never  ? Active Member of Clubs or Organizations: No  ? Attends Banker Meetings: Never  ? Marital Status: Widowed  ?  ? ?Family History: ?The patient's family history includes Cancer in her brother and father; Heart disease in her mother; Hypertension in  her mother. ? ?ROS:   ?Please see the history of present illness.    ? All other systems reviewed and are negative. ? ?EKGs/Labs/Other Studies Reviewed:   ? ?The following studies were reviewed today: ? ?N/A ? ?EKG:  EKG is not ordered today.   ? ?Recent Labs: ?05/27/2021: Hemoglobin 13.4; Platelets 186 ?07/26/2021: ALT 7; BUN 12; Creatinine, Ser 0.86; Potassium 4.2; Sodium 139  ?Recent Lipid Panel ?   ?Component Value Date/Time  ? CHOL 144 07/26/2021 1412  ? TRIG 73 07/26/2021 1412  ? HDL 51 07/26/2021 1412  ? CHOLHDL 2.8 07/26/2021 1412  ? LDLCALC 79 07/26/2021 1412  ? ? ?Physical Exam:   ? ?VS:  BP 124/74 (BP Location: Right Arm, Patient Position: Sitting, Cuff Size: Large)   Pulse 80   Ht 5\' 3"  (1.6 m)   Wt (!) 304 lb (137.9 kg)   SpO2 98%   BMI 53.85 kg/m?    ? ?Wt Readings from Last 3 Encounters:  ?10/10/21 (!) 304 lb (137.9 kg)  ?09/27/21 (!) 313 lb (142 kg)  ?09/19/21 (!) 309 lb 9.6 oz (140.4 kg)  ?  ? ?GEN:  Well nourished, well developed in no acute distress ?HEENT: Normal ?NECK: No JVD; No carotid bruits ?LYMPHATICS: No lymphadenopathy ?CARDIAC: Irreg Irreg, no murmurs, rubs, gallops ?RESPIRATORY:  Clear to auscultation without rales, wheezing or rhonchi  ?ABDOMEN: Soft, non-tender, non-distended ?MUSCULOSKELETAL:  No edema; No deformity  ?SKIN: Warm and dry ?NEUROLOGIC:  Alert and oriented x 3 ?PSYCHIATRIC:  Normal affect  ? ?ASSESSMENT:   ? ?1. Heart failure with preserved ejection fraction, unspecified HF chronicity (HCC)   ?2. Permanent atrial fibrillation (HCC)   ?3. Dyspnea on exertion   ? ?PLAN:   ? ?In order of problems listed above: ? ?DOE ?Chronic diastolic heart failure.  ?Patient denies known history of diastolic heart failure although has been on lasix in the past. She saw Dr. 09/21/21 2 weeks ago who changed lasix to torsemide 20mg  for DOE. She reports breathing is better but not back to baseline. She appears euvolemic on exam. No prior echo. I will order labs, BMET, CBC, TSH, and BNP. I  will order an echo and we will see her back after this.  ? ?Permanent Afib ?Rate controlled Afib followed by EP PRN. Continue stroke ppx with Eliquis. Continue rate control with Toprol-XL 100mg  BID.  ? ? ?Disposition: Follow up in 1 month(s) with MD/APP  ? ?Shared Decision Making/Informed Consent   ?   ? ? ?Signed, ?Shawnn Bouillon Lalla Brothers, PA-C  ?10/10/2021 3:03 PM    ? [facility]  ?

## 2021-10-10 NOTE — Patient Instructions (Signed)
Medication Instructions:  ?- Your physician recommends that you continue on your current medications as directed. Please refer to the Current Medication list given to you today. ? ?*If you need a refill on your cardiac medications before your next appointment, please call your pharmacy* ? ? ?Lab Work: ?- Your physician recommends that you have lab work today: BMP/ BNP/ CBC/ TSH ? ?If you have labs (blood work) drawn today and your tests are completely normal, you will receive your results only by: ?MyChart Message (if you have MyChart) OR ?A paper copy in the mail ?If you have any lab test that is abnormal or we need to change your treatment, we will call you to review the results. ? ? ?Testing/Procedures: ? ?1) Echocardiogram: ?- Your physician has requested that you have an echocardiogram. Echocardiography is a painless test that uses sound waves to create images of your heart. It provides your doctor with information about the size and shape of your heart and how well your heart?s chambers and valves are working. This procedure takes approximately one hour. There are no restrictions for this procedure.There is a possibility that an IV may need to be started during your test to inject an image enhancing agent. This is done to obtain more optimal pictures of your heart. Therefore we ask that you do at least drink some water prior to coming in to hydrate your veins.  ? ? ? ?Follow-Up: ?At [facility], you and your health needs are our priority.  As part of our continuing mission to provide you with exceptional heart care, we have created designated Provider Care Teams.  These Care Teams include your primary Cardiologist (physician) and Advanced Practice Providers (APPs -  Physician Assistants and Nurse Practitioners) who all work together to provide you with the care you need, when you need it. ? ?We recommend signing up for the patient portal called "MyChart".  Sign up information is provided on this After Visit  Summary.  MyChart is used to connect with patients for Virtual Visits (Telemedicine).  Patients are able to view lab/test results, encounter notes, upcoming appointments, etc.  Non-urgent messages can be sent to your provider as well.   ?To learn more about what you can do with MyChart, go to [email or web address].   ? ?Your next appointment:   ?1 month(s) ? ?The format for your next appointment:   ?In Person ? ?Provider:   ?Tarri Glenn, PA-C  ? ? ?Other Instructions ? ?Echocardiogram ?An echocardiogram is a test that uses sound waves (ultrasound) to produce images of the heart. ?Images from an echocardiogram can provide important information about: ?Heart size and shape. ?The size and thickness and movement of your heart's walls. ?Heart muscle function and strength. ?Heart valve function or if you have stenosis. Stenosis is when the heart valves are too narrow. ?If blood is flowing backward through the heart valves (regurgitation). ?A tumor or infectious growth around the heart valves. ?Areas of heart muscle that are not working well because of poor blood flow or injury from a heart attack. ?Aneurysm detection. An aneurysm is a weak or damaged part of an artery wall. The wall bulges out from the normal force of blood pumping through the body. ?Tell a health care provider about: ?Any allergies you have. ?All medicines you are taking, including vitamins, herbs, eye drops, creams, and over-the-counter medicines. ?Any blood disorders you have. ?Any surgeries you have had. ?Any medical conditions you have. ?Whether you are pregnant or may be pregnant. ?What are  the risks? ?Generally, this is a safe test. However, problems may occur, including an allergic reaction to dye (contrast) that may be used during the test. ?What happens before the test? ?No specific preparation is needed. You may eat and drink normally. ?What happens during the test? ? ?You will take off your clothes from the waist up and put on a hospital  gown. ?Electrodes or electrocardiogram (ECG)patches may be placed on your chest. The electrodes or patches are then connected to a device that monitors your heart rate and rhythm. ?You will lie down on a table for an ultrasound exam. A gel will be applied to your chest to help sound waves pass through your skin. ?A handheld device, called a transducer, will be pressed against your chest and moved over your heart. The transducer produces sound waves that travel to your heart and bounce back (or "echo" back) to the transducer. These sound waves will be captured in real-time and changed into images of your heart that can be viewed on a video monitor. The images will be recorded on a computer and reviewed by your health care provider. ?You may be asked to change positions or hold your breath for a short time. This makes it easier to get different views or better views of your heart. ?In some cases, you may receive contrast through an IV in one of your veins. This can improve the quality of the pictures from your heart. ?The procedure may vary among health care providers and hospitals. ?What can I expect after the test? ?You may return to your normal, everyday life, including diet, activities, and medicines, unless your health care provider tells you not to do that. ?Follow these instructions at home: ?It is up to you to get the results of your test. Ask your health care provider, or the department that is doing the test, when your results will be ready. ?Keep all follow-up visits. This is important. ?Summary ?An echocardiogram is a test that uses sound waves (ultrasound) to produce images of the heart. ?Images from an echocardiogram can provide important information about the size and shape of your heart, heart muscle function, heart valve function, and other possible heart problems. ?You do not need to do anything to prepare before this test. You may eat and drink normally. ?After the echocardiogram is completed, you  may return to your normal, everyday life, unless your health care provider tells you not to do that. ?This information is not intended to replace advice given to you by your health care provider. Make sure you discuss any questions you have with your health care provider. ?Document Revised: 03/22/2021 Document Reviewed: 03/01/2020 ?Elsevier Patient Education ? 2022 [place]. ? ? ?

## 2021-10-11 LAB — BASIC METABOLIC PANEL
BUN/Creatinine Ratio: 18 (ref 12–28)
BUN: 17 mg/dL (ref 8–27)
CO2: 25 mmol/L (ref 20–29)
Calcium: 9.5 mg/dL (ref 8.7–10.3)
Chloride: 102 mmol/L (ref 96–106)
Creatinine, Ser: 0.94 mg/dL (ref 0.57–1.00)
Glucose: 102 mg/dL — ABNORMAL HIGH (ref 70–99)
Potassium: 4.1 mmol/L (ref 3.5–5.2)
Sodium: 143 mmol/L (ref 134–144)
eGFR: 65 mL/min/{1.73_m2} (ref >59)

## 2021-10-11 LAB — BRAIN NATRIURETIC PEPTIDE: BNP: 193.3 pg/mL — ABNORMAL HIGH (ref 0.0–100.0)

## 2021-10-11 LAB — CBC
Hematocrit: 39 % (ref 34.0–46.6)
Hemoglobin: 13.1 g/dL (ref 11.1–15.9)
MCH: 30.9 pg (ref 26.6–33.0)
MCHC: 33.6 g/dL (ref 31.5–35.7)
MCV: 92 fL (ref 79–97)
Platelets: 176 10*3/uL (ref 150–450)
RBC: 4.24 x10E6/uL (ref 3.77–5.28)
RDW: 13.3 % (ref 11.7–15.4)
WBC: 5.5 10*3/uL (ref 3.4–10.8)

## 2021-10-11 LAB — TSH: TSH: 5.46 u[IU]/mL — ABNORMAL HIGH (ref 0.450–4.500)

## 2021-10-23 ENCOUNTER — Ambulatory Visit (INDEPENDENT_AMBULATORY_CARE_PROVIDER_SITE_OTHER): Payer: Medicare HMO

## 2021-10-23 DIAGNOSIS — R0609 Other forms of dyspnea: Secondary | ICD-10-CM

## 2021-10-23 LAB — ECHOCARDIOGRAM COMPLETE
AR max vel: 2.62 cm2
AV Area VTI: 2.19 cm2
AV Area mean vel: 2.33 cm2
AV Mean grad: 3 mmHg
AV Peak grad: 5.3 mmHg
AV Vena cont: 0.25 cm
Ao pk vel: 1.15 m/s
Calc EF: 70.8 %
MV M vel: 4.59 m/s
MV Peak grad: 84.3 mmHg
P 1/2 time: 620 msec
S' Lateral: 3 cm
Single Plane A2C EF: 70.9 %
Single Plane A4C EF: 69.9 %

## 2021-10-25 ENCOUNTER — Encounter: Payer: Self-pay | Admitting: Nurse Practitioner

## 2021-10-25 ENCOUNTER — Ambulatory Visit (INDEPENDENT_AMBULATORY_CARE_PROVIDER_SITE_OTHER): Payer: Medicare HMO | Admitting: Nurse Practitioner

## 2021-10-25 VITALS — BP 152/75 | HR 73 | Temp 98.3°F | Wt 304.0 lb

## 2021-10-25 DIAGNOSIS — I5030 Unspecified diastolic (congestive) heart failure: Secondary | ICD-10-CM

## 2021-10-25 NOTE — Progress Notes (Signed)
? ?BP (!) 152/75   Pulse 73   Temp 98.3 ?F (36.8 ?C) (Oral)   Wt (!) 304 lb (137.9 kg)   SpO2 96%   BMI 53.85 kg/m?   ? ?Subjective:  ? ? Patient ID: [name], female    DOB: [birth year], 71 y.o.   MRN: [id number] ? ?HPI: ?[name] is a 71 y.o. female ? ?Chief Complaint  ?Patient presents with  ? Hypertension  ?  F/u, feels well overall per patient   ? Hyperlipidemia  ? Diabetes  ? ?HEART FAILURE ?Patient states she is feeling good.  Her SOB has improved.  She is still taking the Toresemide. She had an updated ECHO done.  Denies any lower extremity swelling or weight gain.  ? ?Relevant past medical, surgical, family and social history reviewed and updated as indicated. Interim medical history since our last visit reviewed. ?Allergies and medications reviewed and updated. ? ?Review of Systems  ?Respiratory:  Negative for shortness of breath.   ?Cardiovascular:  Negative for leg swelling.  ? ?Per HPI unless specifically indicated above ? ?   ?Objective:  ?  ?BP (!) 152/75   Pulse 73   Temp 98.3 ?F (36.8 ?C) (Oral)   Wt (!) 304 lb (137.9 kg)   SpO2 96%   BMI 53.85 kg/m?   ?Wt Readings from Last 3 Encounters:  ?10/25/21 (!) 304 lb (137.9 kg)  ?10/10/21 (!) 304 lb (137.9 kg)  ?09/27/21 (!) 313 lb (142 kg)  ?  ?Physical Exam ?Vitals and nursing note reviewed.  ?Constitutional:   ?   General: She is not in acute distress. ?   Appearance: Normal appearance. She is obese. She is not ill-appearing, toxic-appearing or diaphoretic.  ?HENT:  ?   Head: Normocephalic.  ?   Right Ear: External ear normal.  ?   Left Ear: External ear normal.  ?   Nose: Nose normal.  ?   Mouth/Throat:  ?   Mouth: Mucous membranes are moist.  ?   Pharynx: Oropharynx is clear.  ?Eyes:  ?   General:     ?   Right eye: No discharge.     ?   Left eye: No discharge.  ?   Extraocular Movements: Extraocular movements intact.  ?   Conjunctiva/sclera: Conjunctivae normal.  ?   Pupils: Pupils are equal, round, and reactive to light.   ?Cardiovascular:  ?   Rate and Rhythm: Normal rate and regular rhythm.  ?   Heart sounds: No murmur heard. ?Pulmonary:  ?   Effort: Pulmonary effort is normal. No respiratory distress.  ?   Breath sounds: Normal breath sounds. No wheezing or rales.  ?Musculoskeletal:  ?   Cervical back: Normal range of motion and neck supple.  ?Skin: ?   General: Skin is warm and dry.  ?   Capillary Refill: Capillary refill takes less than 2 seconds.  ?Neurological:  ?   General: No focal deficit present.  ?   Mental Status: She is alert and oriented to person, place, and time. Mental status is at baseline.  ?Psychiatric:     ?   Mood and Affect: Mood normal.     ?   Behavior: Behavior normal.     ?   Thought Content: Thought content normal.     ?   Judgment: Judgment normal.  ? ? ?Results for orders placed or performed in visit on 10/23/21  ?ECHOCARDIOGRAM COMPLETE  ?Result Value Ref Range  ? AR  max vel 2.62 cm2  ? AV Peak grad 5.3 mmHg  ? Ao pk vel 1.15 m/s  ? S' Lateral 3.00 cm  ? AV Area VTI 2.19 cm2  ? AV Mean grad 3.0 mmHg  ? Single Plane A4C EF 69.9 %  ? Single Plane A2C EF 70.9 %  ? Calc EF 70.8 %  ? P 1/2 time 620 msec  ? AV Area mean vel 2.33 cm2  ? MV M vel 4.59 m/s  ? MV Peak grad 84.3 mmHg  ? AV Vena cont 0.25 cm  ? ?   ?Assessment & Plan:  ? ?Problem List Items Addressed This Visit   ? ?  ? Cardiovascular and Mediastinum  ? (HFpEF) heart failure with preserved ejection fraction ([place]) - Primary  ?  Chronic. Has followed up with Cardiology. On metoprolol 125m BID. Had an ECHO done on Monday. Needs follow up with Cardiology in 1 month.  Recommend she make that appointment.  CMP ordered today. Follow up in 3 months for reevaluation. ?  ?  ? Relevant Orders  ? Comp Met (CMET)  ?  ? Other  ? Morbid obesity due to excess calories ([place]  ?  ? ?Follow up plan: ?Return in about 3 months (around 01/24/2022) for HTN, HLD, DM2 FU. ? ? ? ? ? ?

## 2021-10-25 NOTE — Assessment & Plan Note (Signed)
Chronic. Has followed up with Cardiology. On metoprolol 100mg  BID. Had an ECHO done on Monday. Needs follow up with Cardiology in 1 month.  Recommend she make that appointment.  CMP ordered today. Follow up in 3 months for reevaluation. ?

## 2021-10-26 LAB — COMPREHENSIVE METABOLIC PANEL
ALT: 6 IU/L (ref 0–32)
AST: 11 IU/L (ref 0–40)
Albumin/Globulin Ratio: 2.1 (ref 1.2–2.2)
Albumin: 4.2 g/dL (ref 3.8–4.8)
Alkaline Phosphatase: 68 IU/L (ref 44–121)
BUN/Creatinine Ratio: 10 — ABNORMAL LOW (ref 12–28)
BUN: 9 mg/dL (ref 8–27)
Bilirubin Total: 0.8 mg/dL (ref 0.0–1.2)
CO2: 26 mmol/L (ref 20–29)
Calcium: 9.5 mg/dL (ref 8.7–10.3)
Chloride: 104 mmol/L (ref 96–106)
Creatinine, Ser: 0.92 mg/dL (ref 0.57–1.00)
Globulin, Total: 2 g/dL (ref 1.5–4.5)
Glucose: 105 mg/dL — ABNORMAL HIGH (ref 70–99)
Potassium: 3.9 mmol/L (ref 3.5–5.2)
Sodium: 144 mmol/L (ref 134–144)
Total Protein: 6.2 g/dL (ref 6.0–8.5)
eGFR: 67 mL/min/{1.73_m2} (ref >59)

## 2021-10-26 NOTE — Progress Notes (Signed)
Hi [name].  Your lab work looks good.No concerns at this time.  Continue with the toresemide.

## 2021-10-31 ENCOUNTER — Ambulatory Visit (INDEPENDENT_AMBULATORY_CARE_PROVIDER_SITE_OTHER): Payer: Medicare HMO

## 2021-10-31 ENCOUNTER — Telehealth: Payer: Medicare HMO

## 2021-10-31 DIAGNOSIS — I10 Essential (primary) hypertension: Secondary | ICD-10-CM

## 2021-10-31 DIAGNOSIS — M1711 Unilateral primary osteoarthritis, right knee: Secondary | ICD-10-CM

## 2021-10-31 DIAGNOSIS — F4321 Adjustment disorder with depressed mood: Secondary | ICD-10-CM

## 2021-10-31 DIAGNOSIS — I4819 Other persistent atrial fibrillation: Secondary | ICD-10-CM

## 2021-10-31 DIAGNOSIS — M545 Low back pain, unspecified: Secondary | ICD-10-CM

## 2021-10-31 DIAGNOSIS — Z9181 History of falling: Secondary | ICD-10-CM

## 2021-10-31 DIAGNOSIS — I5030 Unspecified diastolic (congestive) heart failure: Secondary | ICD-10-CM

## 2021-10-31 NOTE — Chronic Care Management (AMB) (Signed)
?Chronic Care Management  ? ?CCM RN Visit Note ? ?10/31/2021 ?Name: [name] MRN: [id number] DOB: 26-Aug-1950 ? ?Subjective: ?[name] is a 71 y.o. year old female who is a primary care patient of Jon Billings, NP. The care management team was consulted for assistance with disease management and care coordination needs.   ? ?Engaged with patient by telephone for follow up visit in response to provider referral for case management and/or care coordination services.  ? ?Consent to Services:  ?The patient was given information about Chronic Care Management services, agreed to services, and gave verbal consent prior to initiation of services.  Please see initial visit note for detailed documentation.  ? ?Patient agreed to services and verbal consent obtained.  ? ?Assessment: Review of patient past medical history, allergies, medications, health status, including review of consultants reports, laboratory and other test data, was performed as part of comprehensive evaluation and provision of chronic care management services.  ? ?SDOH (Social Determinants of Health) assessments and interventions performed:   ? ?CCM Care Plan ? ?No Known Allergies ? ?Outpatient Encounter Medications as of 10/31/2021  ?Medication Sig  ? Acetaminophen (TYLENOL 8 HOUR PO) Take by mouth as needed.  ? albuterol (VENTOLIN HFA) 108 (90 Base) MCG/ACT inhaler Inhale 1-2 puffs into the lungs every 6 (six) hours as needed for wheezing or shortness of breath.  ? apixaban (ELIQUIS) 5 MG TABS tablet Take 1 tablet (5 mg total) by mouth 2 (two) times daily.  ? gabapentin (NEURONTIN) 600 MG tablet Take 1 tablet (600 mg total) by mouth 3 (three) times daily as needed.  ? metoprolol succinate (TOPROL-XL) 100 MG 24 hr tablet Take 1 tablet (100 mg total) by mouth in the morning and at bedtime. Take with or immediately following a meal.  ? ondansetron (ZOFRAN) 4 MG tablet Take 1 tablet (4 mg total) by mouth every 8 (eight) hours as needed for  nausea or vomiting.  ? Spacer/Aero-Holding Chambers ([place]) MISC by Does not apply route.  ? torsemide (DEMADEX) 20 MG tablet Take 1 tablet (20 mg) by mouth once daily  ? ?No facility-administered encounter medications on file as of 10/31/2021.  ? ? ?Patient Active Problem List  ? Diagnosis Date Noted  ? Atelectasis 09/04/2021  ? Atrial fibrillation ([place]) 08/24/2020  ? Pneumonia due to COVID-19 virus 08/24/2020  ? (HFpEF) heart failure with preserved ejection fraction ([place]) 08/24/2020  ? Viral URI 08/03/2020  ? Postherpetic neuralgia 03/29/2020  ? Grief 03/29/2020  ? Cough 03/29/2020  ? Encounter for screening colonoscopy   ? Elevated blood pressure reading 01/19/2020  ? Osteoarthritis of right knee 12/21/2019  ? Morbid obesity due to excess calories ([place]) 08/13/2015  ? Chronic pain of right knee 08/13/2015  ? ? ?Conditions to be addressed/monitored:Atrial Fibrillation, CHF, HTN, and chronic pain, grief and falls prevention ? ?Care Plan : RNCM: Management of Chronic Diseases: HF, AFIB, HTN, Chronic pain, grief and frequent falls  ?Updates made by Vanita Ingles, RN since 10/31/2021 12:00 AM  ?  ? ?Problem: RNCM: Management of HF, AFIB, HTN, chronic pain, grief, and frequent falls   ?Priority: High  ?  ? ?Long-Range Goal: RNCM: Effective management of HF, AFIB, HTN, Chronic pain, grief, and falls prevention   ?Start Date: 01/27/2021  ?Expected End Date: 01/27/2022  ?Recent Progress: On track  ?Priority: High  ?Note:   ?Current Barriers:  ?Knowledge Deficits related to plan of care for management of Atrial Fibrillation, CHF, HTN, and Chronic pain, grief  and falls  ?Care Coordination needs related to Level of care concerns, Medication procurement, and Mental Health Concerns  in a patient with Atrial Fibrillation, CHF, HTN, and chronic pain, grief, falls  ?Chronic Disease Management support and education needs related to Atrial Fibrillation, CHF, HTN, and chronic pain, grief, falls ?Film/video editor.   ?Difficulty obtaining medications ? ?RNCM Clinical Goal(s):  ?Patient will verbalize understanding of plan for management of Atrial Fibrillation, CHF, HTN, and Chronic pain, grief and falls ?work with RN Case Freight forwarder and Pharmacist to address needs related to Atrial Fibrillation, CHF, HTN, and chronic pain, grief and falls and Financial constraints related to inability to pay for medications , Level of care concerns, and Mental Health Concerns  ?take all medications exactly as prescribed and will call provider for medication related questions ?attend all scheduled medical appointments: 01-24-2022 at 200 pm ?demonstrate a decrease in Atrial Fibrillation, CHF, HTN, and Chronic pain, grief and fall exacerbations ?demonstrate improved adherence to prescribed treatment plan for Atrial Fibrillation, CHF, HTN, and Chronic pain, grief, and falls  ?demonstrate improved health management independence ?verbalize basic understanding of Atrial Fibrillation, CHF, HTN, and Chronic pain, grief, and fall disease process and self health management plan ?demonstrate understanding of rationale for each prescribed medication ?work with CM team pharmacist to assist with medication cost constraints and reconciliation ?demonstrate ongoing self health care management ability through collaboration with RN Care manager, provider, and care team.  ? ?Interventions: ?1:1 collaboration with Jon Billings, regarding development and update of comprehensive plan of care as evidenced by provider attestation and co-signature ?Inter-disciplinary care team collaboration (see longitudinal plan of care) ? ? ?A-fib:  (Status: Goal on track: YES.) ?Counseled on increased risk of stroke due to Afib and benefits of anticoagulation for stroke prevention; ?Reviewed importance of adherence to anticoagulant exactly as prescribed, the patient states she went for a cardioversion and they told her she did not need it. She has been off of  her medications. The  patient states she will do what the doctor tells her to do but right now she is not taking any medications for her heart conditions.  She further states that she can not afford the medications and would need help with options if the MD wanted her on certain medications. 04-28-2021: The patient states that she does not have to take medications for her heart health. Discussed with her the medications list and she said that she went to have the procedure but did not have to have. The patient denies any new issues with her AFIB. Will collaborate with the pcp for recommendations. 10-31-2021: The patient states she is now taking her medications and she is checking her blood pressures and heart rate at home. She states she seems to be doing well with management of her medications. Saw pcp and cardiologist recently and denies any changes in her medications.  ?Advised patient to discuss options for medications, plan of care, possibility of wearing a hollister monitor for evaluation of heart rhythm  with provider; ?Counseled on avoidance of NSAIDs due to increased bleeding risk with anticoagulants; ?Counseled on importance of regular laboratory monitoring as prescribed; ?Counseled on seeking medical attention after a head injury or if there is blood in the urine/stool; ?Afib action plan reviewed. 06-22-2021: Was in the ER in November for irregular heart rate and atypical chest pain. The patient was released and followed up with cardiology. The patient states that she is feeling much better and denies any further chest pain. 08-23-2021: The patient denies  any issues with her AFIB or heart healthy. Currently getting over COVID + diagnosis. The patient denies any acute distress today.  ?Had an Echocardiogram on 10-23-2021 and her EF was 60 to 65% ? ?Falls:  (Status: Goal Met.) 10-31-2021: The patient has had no new falls. Closing this goal at this time. Will continue to monitor.  ?Provided written and verbal education re: potential  causes of falls and Fall prevention strategies ?Reviewed medications and discussed potential side effects of medications such as dizziness and frequent urination ?Advised patient of importance of notifying provider o

## 2021-10-31 NOTE — Patient Instructions (Signed)
Visit Information ? ?Thank you for taking time to visit with me today. Please don't hesitate to contact me if I can be of assistance to you before our next scheduled telephone appointment. ? ?Following are the goals we discussed today:  ?RNCM Clinical Goal(s):  ?Patient will verbalize understanding of plan for management of Atrial Fibrillation, CHF, HTN, and Chronic pain, grief and falls ?work with RN Case Freight forwarder and Pharmacist to address needs related to Atrial Fibrillation, CHF, HTN, and chronic pain, grief and falls and Financial constraints related to inability to pay for medications , Level of care concerns, and Mental Health Concerns  ?take all medications exactly as prescribed and will call provider for medication related questions ?attend all scheduled medical appointments: 01-24-2022 at 200 pm ?demonstrate a decrease in Atrial Fibrillation, CHF, HTN, and Chronic pain, grief and fall exacerbations ?demonstrate improved adherence to prescribed treatment plan for Atrial Fibrillation, CHF, HTN, and Chronic pain, grief, and falls  ?demonstrate improved health management independence ?verbalize basic understanding of Atrial Fibrillation, CHF, HTN, and Chronic pain, grief, and fall disease process and self health management plan ?demonstrate understanding of rationale for each prescribed medication ?work with CM team pharmacist to assist with medication cost constraints and reconciliation ?demonstrate ongoing self health care management ability through collaboration with RN Care manager, provider, and care team.  ?  ?Interventions: ?1:1 collaboration with Jon Billings, regarding development and update of comprehensive plan of care as evidenced by provider attestation and co-signature ?Inter-disciplinary care team collaboration (see longitudinal plan of care) ?  ?  ?A-fib:  (Status: Goal on track: YES.) ?Counseled on increased risk of stroke due to Afib and benefits of anticoagulation for stroke  prevention; ?Reviewed importance of adherence to anticoagulant exactly as prescribed, the patient states she went for a cardioversion and they told her she did not need it. She has been off of  her medications. The patient states she will do what the doctor tells her to do but right now she is not taking any medications for her heart conditions.  She further states that she can not afford the medications and would need help with options if the MD wanted her on certain medications. 04-28-2021: The patient states that she does not have to take medications for her heart health. Discussed with her the medications list and she said that she went to have the procedure but did not have to have. The patient denies any new issues with her AFIB. Will collaborate with the pcp for recommendations. 10-31-2021: The patient states she is now taking her medications and she is checking her blood pressures and heart rate at home. She states she seems to be doing well with management of her medications. Saw pcp and cardiologist recently and denies any changes in her medications.  ?Advised patient to discuss options for medications, plan of care, possibility of wearing a hollister monitor for evaluation of heart rhythm  with provider; ?Counseled on avoidance of NSAIDs due to increased bleeding risk with anticoagulants; ?Counseled on importance of regular laboratory monitoring as prescribed; ?Counseled on seeking medical attention after a head injury or if there is blood in the urine/stool; ?Afib action plan reviewed. 06-22-2021: Was in the ER in November for irregular heart rate and atypical chest pain. The patient was released and followed up with cardiology. The patient states that she is feeling much better and denies any further chest pain. 08-23-2021: The patient denies any issues with her AFIB or heart healthy. Currently getting over COVID + diagnosis.  The patient denies any acute distress today.  ?Had an Echocardiogram on 10-23-2021  and her EF was 60 to 65% ?  ?Falls:  (Status: Goal Met.) 10-31-2021: The patient has had no new falls. Closing this goal at this time. Will continue to monitor.  ?Provided written and verbal education re: potential causes of falls and Fall prevention strategies ?Reviewed medications and discussed potential side effects of medications such as dizziness and frequent urination ?Advised patient of importance of notifying provider of falls. Education and support given. 10-31-2021: Reviewed safety with the patient. The patient denies any new concerns with safety at this time. States she is using her cane when ambulating and she has not had any new falls. Will continue to monitor for changes. ?Assessed for signs and symptoms of orthostatic hypotension. Denies issues with orthostatic hypotension or dizziness  ?Assessed for falls since last encounter. Patient states last fall was 01-10-2021. The patient was using her walker but states she lost her balance and that is why she fell. The patient denies injury. 10-31-2021: The patient denies any new falls today. The patient states she is using her cane most of the time. States she wants to be careful and not fall. She feels like she is doing well.  ?Assessed patients knowledge of fall risk prevention secondary to previously provided education ?Provided patient information for fall alert systems ?Advised patient to discuss falls, safety in the home, concerns about safety and when she has new falls  with provider ?Assessed social determinant of health barriers ?  ?Heart Failure Interventions:  (Status: Goal on track: YES) ?Basic overview and discussion of pathophysiology of Heart Failure reviewed; ?Provided education on low sodium diet- the patient states she does not use salt in her diet. The patient stated compliance with a heart healthy diet.4-11--2023: Review with the patient and the patient is mindful of dietary restrictions.  ?Reviewed Heart Failure Action Plan in depth and  provided written copy; ?Assessed need for readable accurate scales in home- the patient does not have a scale but states she can get on. Reviewed the importance of daily weights with the patient due to the subtle changes that can alert the patient to exacerbations with heart failure. ; ?Provided education about placing scale on hard, flat surface- reviewed- patient to get a scale; ?Advised patient to weigh each morning after emptying bladder; ?Discussed importance of daily weight and advised patient to weigh and record daily. 04-28-2021: The patient did not have any weights for the [facility]. Ask the patient to start a record of blood pressures and weights to have on hand at the next outreach. The patient states that she has the blood pressure cuff and has been taking but could not give the RNCM any numbers. 06-22-2021: The patient is taking her blood pressure and heart rate some at home. States systolic readings are 168-372'B and she is feeling better since seeing the cardiologist. Denies any chest pain today. Education and support given. 10-31-2021: The patient states that she is doing well. Her blood pressures are stable and she denies any acute findings. Saw pcp and cardiologist recently and had an Echocardiogram.  ?Reviewed role of diuretics in prevention of fluid overload and management of heart failure. 10-31-2021: Is not taking any diuretics at this time. Review and eduction provided.  ?Discussed the importance of keeping all appointments with provider, review of upcoming appointments. Saw pcp and cardiologist recently. Has an appointment with pcp on 01-24-2022 at 2 pm ?Provided patient with education about the role of  exercise in the management of heart failure; ?Advised patient to discuss heart failure plan of care  with provider. 10-31-2021: The patient denies any swelling or edema at this time; ?Reviewed EF% with the patient and education provided on what the EF% was. Patients last EF% was 60-65% in January of 2022.  Had a new Echocardiogram on 10-23-2021 and her EF% was 60 to 65% ?  ?Hypertension: (Status: Goal on Track (progressing): YES.) ?Last practice recorded BP readings:  ?   ?BP Readings from Last 3 Encounters:  ?10/25/21 Marland Kitchen)

## 2021-11-03 LAB — SPECIMEN STATUS REPORT

## 2021-11-03 LAB — T4, FREE: Free T4: 1 ng/dL (ref 0.82–1.77)

## 2021-11-13 ENCOUNTER — Ambulatory Visit: Payer: Medicare HMO | Admitting: Medical

## 2021-11-13 ENCOUNTER — Encounter: Payer: Self-pay | Admitting: Medical

## 2021-11-13 VITALS — BP 100/64 | HR 74 | Ht 63.0 in | Wt 305.4 lb

## 2021-11-13 DIAGNOSIS — I4821 Permanent atrial fibrillation: Secondary | ICD-10-CM | POA: Diagnosis not present

## 2021-11-13 DIAGNOSIS — R0609 Other forms of dyspnea: Secondary | ICD-10-CM

## 2021-11-13 DIAGNOSIS — I5030 Unspecified diastolic (congestive) heart failure: Secondary | ICD-10-CM

## 2021-11-13 NOTE — Patient Instructions (Signed)
Medication Instructions:  ?- Your physician recommends that you continue on your current medications as directed. Please refer to the Current Medication list given to you today. ? ?*If you need a refill on your cardiac medications before your next appointment, please call your pharmacy* ? ? ?Lab Work: ?- Your physician recommends that you have lab work today: BNP ? ?If you have labs (blood work) drawn today and your tests are completely normal, you will receive your results only by: ?MyChart Message (if you have MyChart) OR ?A paper copy in the mail ?If you have any lab test that is abnormal or we need to change your treatment, we will call you to review the results. ? ? ?Testing/Procedures: ?- none ordered ? ? ?Follow-Up: ?At [facility], you and your health needs are our priority.  As part of our continuing mission to provide you with exceptional heart care, we have created designated Provider Care Teams.  These Care Teams include your primary Cardiologist (physician) and Advanced Practice Providers (APPs -  Physician Assistants and Nurse Practitioners) who all work together to provide you with the care you need, when you need it. ? ?We recommend signing up for the patient portal called "MyChart".  Sign up information is provided on this After Visit Summary.  MyChart is used to connect with patients for Virtual Visits (Telemedicine).  Patients are able to view lab/test results, encounter notes, upcoming appointments, etc.  Non-urgent messages can be sent to your provider as well.   ?To learn more about what you can do with MyChart, go to [email or web address].   ? ?Your next appointment:   ?3 month(s) ? ?The format for your next appointment:   ?In Person ? ?Provider:   ?You may see Lars Mage, MD or one of the following Advanced Practice Providers on your designated Care Team:   ? ?Cadence Kathlen Mody, PA-C  ? ? ?Other Instructions ?N/a ? ?Important Information About Sugar ? ? ? ? ? ? ?

## 2021-11-13 NOTE — Progress Notes (Signed)
?Cardiology Office Note:   ? ?Date:  11/13/2021  ? ?ID:  [name], DOB May 08, 1951, MRN [id number] ? ?PCP:  Jon Billings, NP  ?[facility] Cardiologist:  None  ?[facility] Electrophysiologist:  Vickie Epley, MD  ? ?Referring MD: Jon Billings, NP  ? ?Chief Complaint: 1 month follow-up ? ?History of Present Illness:   ? ?[name] is a 71 y.o. female with a hx of of atrial fibrillation, chronic diastolic heart failure who presents for 2 week follow-up.  ?  ?Seen by EP 09/27/21. BB previously increased for elevated rates. Not a candidate for catheter ablation. Had persistent SOB she was transitioned to Torsemide 20mg  daily.  ? ?Last seen 10/10/21 and reported breathing was better on Torsemide but not back to normal. Labs and an echo were ordered.  ? ?Echo showed LVEF 60-65%, no WMA, moderately dilated LA and RA, mild MR, mild to mod TR. ? ?Today, the patient reports breathing is slowly improving. She denies chest pain. No swelling on her feet. BP borderine, no lightheadedness or dizziness. No palpitations or orthopnea. She only takes torsemide 20mg  every other day. She does not like going to the bathroom all day,  she does not want to take torsemide daily. She does not check weights every day.  ? ?Past Medical History:  ?Diagnosis Date  ? History of shingles   ? Knee pain   ? Migraines   ? ? ?Past Surgical History:  ?Procedure Laterality Date  ? COLONOSCOPY WITH PROPOFOL N/A 02/19/2020  ? Procedure: COLONOSCOPY WITH PROPOFOL;  Surgeon: Virgel Manifold, MD;  Location: [facility] ENDOSCOPY;  Service: Endoscopy;  Laterality: N/A;  ? REPLACEMENT TOTAL KNEE    ? left knee  ? TUBAL LIGATION    ? ? ?Current Medications: ?Current Meds  ?Medication Sig  ? Acetaminophen (TYLENOL 8 HOUR PO) Take by mouth as needed.  ? albuterol (VENTOLIN HFA) 108 (90 Base) MCG/ACT inhaler Inhale 1-2 puffs into the lungs every 6 (six) hours as needed for wheezing or shortness of breath.  ? apixaban (ELIQUIS) 5 MG TABS  tablet Take 1 tablet (5 mg total) by mouth 2 (two) times daily.  ? gabapentin (NEURONTIN) 600 MG tablet Take 1 tablet (600 mg total) by mouth 3 (three) times daily as needed.  ? metoprolol succinate (TOPROL-XL) 100 MG 24 hr tablet Take 1 tablet (100 mg total) by mouth in the morning and at bedtime. Take with or immediately following a meal.  ? ondansetron (ZOFRAN) 4 MG tablet Take 1 tablet (4 mg total) by mouth every 8 (eight) hours as needed for nausea or vomiting.  ? Spacer/Aero-Holding Chambers ([place]) MISC by Does not apply route.  ? torsemide (DEMADEX) 20 MG tablet Take 1 tablet (20 mg) by mouth once daily  ?  ? ?Allergies:   Patient has no known allergies.  ? ?Social History  ? ?Socioeconomic History  ? Marital status: Widowed  ?  Spouse name: Not on file  ? Number of children: Not on file  ? Years of education: Not on file  ? Highest education level: Not on file  ?Occupational History  ? Occupation: retired  ?Tobacco Use  ? Smoking status: Never  ? Smokeless tobacco: Never  ?Vaping Use  ? Vaping Use: Never used  ?Substance and Sexual Activity  ? Alcohol use: No  ? Drug use: No  ? Sexual activity: Not Currently  ?Other Topics Concern  ? Not on file  ?Social History Narrative  ? Not on  file  ? ?Social Determinants of Health  ? ?Financial Resource Strain: Low Risk   ? Difficulty of Paying Living Expenses: Not hard at all  ?Food Insecurity: No Food Insecurity  ? Worried About Charity fundraiser in the Last Year: Never true  ? Ran Out of Food in the Last Year: Never true  ?Transportation Needs: No Transportation Needs  ? Lack of Transportation (Medical): No  ? Lack of Transportation (Non-Medical): No  ?Physical Activity: Inactive  ? Days of Exercise per Week: 0 days  ? Minutes of Exercise per Session: 0 min  ?Stress: No Stress Concern Present  ? Feeling of Stress : Not at all  ?Social Connections: Socially Isolated  ? Frequency of Communication with Friends and Family: More than three times a week   ? Frequency of Social Gatherings with Friends and Family: More than three times a week  ? Attends Religious Services: Never  ? Active Member of Clubs or Organizations: No  ? Attends Archivist Meetings: Never  ? Marital Status: Widowed  ?  ? ?Family History: ?The patient's family history includes Cancer in her brother and father; Heart disease in her mother; Hypertension in her mother. ? ?ROS:   ?Please see the history of present illness.    ? All other systems reviewed and are negative. ? ?EKGs/Labs/Other Studies Reviewed:   ? ?The following studies were reviewed today: ? ?Echo 10/23/21 ? ? 1. Left ventricular ejection fraction, by estimation, is 60 to 65%. The  ?left ventricle has normal function. The left ventricle has no regional  ?wall motion abnormalities. Left ventricular diastolic parameters are  ?indeterminate.  ? 2. Right ventricular systolic function is normal. The right ventricular  ?size is not well visualized.  ? 3. Left atrial size was mild to moderately dilated.  ? 4. Right atrial size was mildly dilated.  ? 5. The mitral valve is degenerative. Mild mitral valve regurgitation.  ?Moderate mitral annular calcification.  ? 6. Tricuspid valve regurgitation is mild to moderate.  ? 7. The aortic valve is tricuspid. Aortic valve regurgitation is mild.  ?Aortic valve sclerosis is present, with no evidence of aortic valve  ?stenosis.  ? ?Comparison(s): Echo performed at [facility]. LVEF 60-65%.  ? ?EKG:  EKG is  ordered today.  The ekg ordered today demonstrates Afib 74bpm, low voltage, nonspecific T wave changes ? ?Recent Labs: ?10/10/2021: BNP 193.3; Hemoglobin 13.1; Platelets 176; TSH 5.460 ?10/25/2021: ALT 6; BUN 9; Creatinine, Ser 0.92; Potassium 3.9; Sodium 144  ?Recent Lipid Panel ?   ?Component Value Date/Time  ? CHOL 144 07/26/2021 1412  ? TRIG 73 07/26/2021 1412  ? HDL 51 07/26/2021 1412  ? CHOLHDL 2.8 07/26/2021 1412  ? [place] 79 07/26/2021 1412  ? ? ? ?Physical Exam:   ? ?VS:  BP 100/64 (BP  Location: Right Wrist, Patient Position: Sitting, Cuff Size: Normal)   Pulse 74   Ht 5\' 3"  (1.6 m)   Wt (!) 305 lb 6 oz (138.5 kg)   SpO2 96%   BMI 54.09 kg/m?    ? ?Wt Readings from Last 3 Encounters:  ?11/13/21 (!) 305 lb 6 oz (138.5 kg)  ?10/25/21 (!) 304 lb (137.9 kg)  ?10/10/21 (!) 304 lb (137.9 kg)  ?  ? ?GEN:  Well nourished, well developed in no acute distress ?HEENT: Normal ?NECK: No JVD; No carotid bruits ?LYMPHATICS: No lymphadenopathy ?CARDIAC: Irreg Irreg, no murmurs, rubs, gallops ?RESPIRATORY:  Clear to auscultation without rales, wheezing or rhonchi  ?ABDOMEN:  Soft, non-tender, non-distended ?MUSCULOSKELETAL:  No edema; No deformity  ?SKIN: Warm and dry ?NEUROLOGIC:  Alert and oriented x 3 ?PSYCHIATRIC:  Normal affect  ? ?ASSESSMENT:   ? ?1. Dyspnea on exertion   ?2. Heart failure with preserved ejection fraction, unspecified HF chronicity ([place])   ?3. Permanent atrial fibrillation ([place])   ? ?PLAN:   ? ?In order of problems listed above: ? ?SOB ?HFpEF ?Echo showed normal LVSF. Patient reports breathing is slowly improving. Some confusion about torsemide dose; It was prescribed originally as torsemide 20 mg daily, however patient reports she has been taking it every other day, about 4 times a week, very vague answers. On exam she appears euvolemic. Prior BNP was elevated to 193, however patient did not increase torsemide. The patient is not wanting to take torsemide daily due to frequent urination. I will re-check the BNP. Leave Torsemide dose as is, overall it's reassuring breathing is improving.I encouraged daily weights.   ? ?Permanent Afib ?She is in rate controlled afib. Continue Eliquis 5mg  BID for stroke prophylaxis and Toprol 100mg  BID for rate control.  ? ?Disposition: Follow up in 3 month(s) with MD/APP  ? ? ?Signed, ?Oris Staffieri Ninfa Meeker, PA-C  ?11/13/2021 3:53 PM    ?[place]  ?

## 2021-11-14 LAB — BRAIN NATRIURETIC PEPTIDE: BNP: 201.9 pg/mL — ABNORMAL HIGH (ref 0.0–100.0)

## 2021-11-15 ENCOUNTER — Telehealth: Payer: Self-pay

## 2021-11-15 NOTE — Telephone Encounter (Signed)
Called the pt. Lmtcb. ?Clarified with Cadence [place], [place]. Patient is taking torsemide not lasix. ?Instructions still apply. Increase torsemide to 40 mg daily x3 days then reduce to 20 mg daily.  ? ? ?

## 2021-11-15 NOTE — Telephone Encounter (Signed)
-----   Message from [place], PA-C sent at 11/15/2021 12:08 PM EDT ----- ?BNP level fairly stable. As discussed in the visit, recommend increasing lasix to 40mg  daily for three days then back down to 20mg . Also recommend low salt, leg elevation, compression socks.  ?

## 2021-11-16 NOTE — Telephone Encounter (Signed)
2nd attempt to contact the patient. Lmtcb. 

## 2021-11-19 DIAGNOSIS — I110 Hypertensive heart disease with heart failure: Secondary | ICD-10-CM | POA: Diagnosis not present

## 2021-11-19 DIAGNOSIS — I4891 Unspecified atrial fibrillation: Secondary | ICD-10-CM | POA: Diagnosis not present

## 2021-11-19 DIAGNOSIS — I509 Heart failure, unspecified: Secondary | ICD-10-CM | POA: Diagnosis not present

## 2021-11-20 NOTE — Telephone Encounter (Signed)
3rd attempt to contact the patient. Letter mailed to the pt to contact the office for results. ?

## 2021-12-07 ENCOUNTER — Other Ambulatory Visit: Payer: Self-pay | Admitting: Nurse Practitioner

## 2021-12-07 ENCOUNTER — Other Ambulatory Visit: Payer: Self-pay | Admitting: Cardiology

## 2021-12-07 DIAGNOSIS — U071 COVID-19: Secondary | ICD-10-CM

## 2021-12-08 NOTE — Telephone Encounter (Signed)
Prednisone was discontinued 08/25/2021. Pt now taking 100mg  Metoprolol. Gabapentin and Eliquis were both refilled 07/26/2021 with a 6 month supply.  Requested Prescriptions  Pending Prescriptions Disp Refills  . metoprolol succinate (TOPROL-XL) 50 MG 24 hr tablet [Pharmacy Med Name: METOPROLOL SUCC ER 50 MG TAB] 90 tablet 1    Sig: TAKE 1 TABLET BY MOUTH EVERY DAY     Cardiovascular:  Beta Blockers Passed - 12/07/2021 10:26 PM      Passed - Last BP in normal range    BP Readings from Last 1 Encounters:  11/13/21 100/64         Passed - Last Heart Rate in normal range    Pulse Readings from Last 1 Encounters:  11/13/21 74         Passed - Valid encounter within last 6 months    Recent Outpatient Visits          1 month ago Heart failure with preserved ejection fraction, unspecified HF chronicity (HCC)   [facility]   2 months ago Heart failure with preserved ejection fraction, unspecified HF chronicity (HCC)   [facility], NP   3 months ago Atelectasis   [facility] [place], [place]   3 months ago Persistent atrial fibrillation (HCC)   [facility] NP   3 months ago COVID-19   [facility], NP      Future Appointments            In 1 month Larae Grooms, NP [facility], [facility]   In 2 months [facility], MD [facility], [facility]   In 4 months  [facility], [facility]           . gabapentin (NEURONTIN) 600 MG tablet [Pharmacy Med Name: GABAPENTIN 600 MG TABLET] 90 tablet 2    Sig: TAKE 1 TABLET (600 MG TOTAL) BY MOUTH 3 (THREE) TIMES DAILY AS NEEDED.     Neurology: Anticonvulsants - gabapentin Passed - 12/07/2021 10:26 PM      Passed - Cr in normal range and within 360 days    Creatinine, Ser  Date Value Ref Range Status  10/25/2021 0.92 0.57 - 1.00 mg/dL Final         Passed -  Completed PHQ-2 or PHQ-9 in the last 360 days      Passed - Valid encounter within last 12 months    Recent Outpatient Visits          1 month ago Heart failure with preserved ejection fraction, unspecified HF chronicity (HCC)   [facility], NP   2 months ago Heart failure with preserved ejection fraction, unspecified HF chronicity (HCC)   [facility], NP   3 months ago Atelectasis   [facility], NP   3 months ago Persistent atrial fibrillation [facility])   [facility] NP   3 months ago COVID-19   [facility] NP      Future Appointments            In 1 month Larae Grooms, NP [facility]   In 2 months [facility], MD [facility], [facility]   In 4 months  [facility]           . predniSONE (DELTASONE) 10 MG tablet [Pharmacy Med Name: PREDNISONE 10 MG TABLET]  21 tablet 0    Sig: TAKE 1 TABLET (10 MG TOTAL) BY MOUTH DAILY WITH BREAKFAST.     Not Delegated - Endocrinology:  Oral Corticosteroids Failed - 12/07/2021 10:26 PM      Failed - This refill cannot be delegated      Failed - Manual Review: Eye exam for IOP if prolonged treatment      Failed - Glucose (serum) in normal range and within 180 days    Glucose  Date Value Ref Range Status  10/25/2021 105 (H) 70 - 99 mg/dL Final   Glucose, Bld  Date Value Ref Range Status  05/27/2021 123 (H) 70 - 99 mg/dL Final    Comment:    Glucose reference range applies only to samples taken after fasting for at least 8 hours.         Failed - Bone Mineral Density or Dexa Scan completed in the last 2 years      Passed - K in normal range and within 180 days    Potassium  Date Value Ref Range Status  10/25/2021 3.9 3.5 - 5.2 mmol/L Final         Passed - Na in normal range and within 180 days    Sodium  Date Value  Ref Range Status  10/25/2021 144 134 - 144 mmol/L Final         Passed - Last BP in normal range    BP Readings from Last 1 Encounters:  11/13/21 100/64         Passed - Valid encounter within last 6 months    Recent Outpatient Visits          1 month ago Heart failure with preserved ejection fraction, unspecified HF chronicity (HCC)   [facility] Larae Grooms, NP   2 months ago Heart failure with preserved ejection fraction, unspecified HF chronicity (HCC)   [facility], NP   3 months ago Atelectasis   [facility] Larae Grooms, NP   3 months ago Persistent atrial fibrillation [facility])   [facility] Lar[facility] Grooms, NP   3 months ago COVID-19   [facility] Lar[facility] Grooms, NP      Future Appointments            In 1 month Larae Grooms, NP [facility], [facility]   In 2 months Lanier Prude, MD [facility], [facility]   In 4 months  [facility], [facility]           . ELIQUIS 5 MG TABS tablet [Pharmacy Med Name: ELIQUIS 5 MG TABLET] 60 tablet 1    Sig: TAKE 1 TABLET BY MOUTH TWICE A DAY     Hematology:  Anticoagulants - apixaban Passed - 12/07/2021 10:26 PM      Passed - PLT in normal range and within 360 days    Platelets  Date Value Ref Range Status  10/10/2021 176 150 - 450 x10E3/uL Final         Passed - HGB in normal range and within 360 days    Hemoglobin  Date Value Ref Range Status  10/10/2021 13.1 11.1 - 15.9 g/dL Final         Passed - HCT in normal range and within 360 days    Hematocrit  Date Value Ref Range Status  10/10/2021 39.0 34.0 - 46.6 % Final         Passed - Cr in normal range and  within 360 days    Creatinine, Ser  Date Value Ref Range Status  10/25/2021 0.92 0.57 - 1.00 mg/dL Final         Passed - AST in normal range and within 360 days    AST  Date Value Ref Range Status  10/25/2021 11 0 - 40 IU/L  Final         Passed - ALT in normal range and within 360 days    ALT  Date Value Ref Range Status  10/25/2021 6 0 - 32 IU/L Final         Passed - Valid encounter within last 12 months    Recent Outpatient Visits          1 month ago Heart failure with preserved ejection fraction, unspecified HF chronicity (HCC)   [facility] Larae GroomsHoldsworth, Karen, NP   2 months ago Heart failure with preserved ejection fraction, unspecified HF chronicity (HCC)   [facility] Larae GroomsHoldsworth, Karen, NP   3 months ago Atelectasis   [facility] Larae GroomsHoldsworth, Karen, NP   3 months ago Persistent atrial fibrillation [facility](HCC)   [facility] Larae GroomsHoldsworth, Karen, NP   3 months ago COVID-19   [facility] Larae GroomsHoldsworth, Karen, NP      Future Appointments            In 1 month Larae GroomsHoldsworth, Karen, NP [facility]   In 2 months Lalla BrothersLambert, Rossie Muskratameron T, MD [facility], [facility]   In 4 months  [facility]

## 2021-12-08 NOTE — Telephone Encounter (Signed)
Requested medications are due for refill today.  no  Requested medications are on the active medications list.  no  Last refill. Prednisone 08/11/2021 #21 0 refills, Metoprolol 50 mg 05/25/2021 #60 1 refill  Future visit scheduled.   yes  Notes to clinic.  Both medications were discontinued. Prednisone d/c'd 08/25/2021. Metoprolol d/c's 06/14/2021 - pt now taking 100 mg.     Requested Prescriptions  Pending Prescriptions Disp Refills   metoprolol succinate (TOPROL-XL) 50 MG 24 hr tablet [Pharmacy Med Name: METOPROLOL SUCC ER 50 MG TAB] 90 tablet 1    Sig: TAKE 1 TABLET BY MOUTH EVERY DAY     Cardiovascular:  Beta Blockers Passed - 12/07/2021 10:26 PM      Passed - Last BP in normal range    BP Readings from Last 1 Encounters:  11/13/21 100/64         Passed - Last Heart Rate in normal range    Pulse Readings from Last 1 Encounters:  11/13/21 74         Passed - Valid encounter within last 6 months    Recent Outpatient Visits           1 month ago Heart failure with preserved ejection fraction, unspecified HF chronicity (HCC)   [facility] Larae Grooms, NP   2 months ago Heart failure with preserved ejection fraction, unspecified HF chronicity (HCC)   [facility] Larae Grooms, NP   3 months ago Atelectasis   [facility], Clydie Braun, NP   3 months ago Persistent atrial fibrillation (HCC)   [facility] Lar[facility] Grooms, NP   3 months ago COVID-19   [facility] Larae Grooms, NP       Future Appointments             In 1 month Larae Grooms, NP [facility], [facility]   In 2 months Lanier Prude, MD [facility], [facility]   In 4 months  [facility], [facility]              predniSONE (DELTASONE) 10 MG tablet [Pharmacy Med Name: PREDNISONE 10 MG TABLET] 21 tablet 0    Sig: TAKE 1 TABLET (10 MG TOTAL) BY MOUTH DAILY WITH BREAKFAST.     Not  Delegated - Endocrinology:  Oral Corticosteroids Failed - 12/07/2021 10:26 PM      Failed - This refill cannot be delegated      Failed - Manual Review: Eye exam for IOP if prolonged treatment      Failed - Glucose (serum) in normal range and within 180 days    Glucose  Date Value Ref Range Status  10/25/2021 105 (H) 70 - 99 mg/dL Final   Glucose, Bld  Date Value Ref Range Status  05/27/2021 123 (H) 70 - 99 mg/dL Final    Comment:    Glucose reference range applies only to samples taken after fasting for at least 8 hours.         Failed - Bone Mineral Density or Dexa Scan completed in the last 2 years      Passed - K in normal range and within 180 days    Potassium  Date Value Ref Range Status  10/25/2021 3.9 3.5 - 5.2 mmol/L Final         Passed - Na in normal range and within 180 days    Sodium  Date Value Ref Range Status  10/25/2021 144 134 - 144 mmol/L  Final         Passed - Last BP in normal range    BP Readings from Last 1 Encounters:  11/13/21 100/64         Passed - Valid encounter within last 6 months    Recent Outpatient Visits           1 month ago Heart failure with preserved ejection fraction, unspecified HF chronicity (HCC)   [facility] Larae GroomsHoldsworth, Karen, NP   2 months ago Heart failure with preserved ejection fraction, unspecified HF chronicity (HCC)   [facility] Larae GroomsHoldsworth, Karen, NP   3 months ago Atelectasis   [facility] [place], Clydie BraunKaren, NP   3 months ago Persistent atrial fibrillation (HCC)   [facility] Larae GroomsHoldsworth, Karen, NP   3 months ago COVID-19   [facility] Larae GroomsHoldsworth, Karen, NP       Future Appointments             In 1 month Larae GroomsHoldsworth, Karen, NP [facility]   In 2 months Lanier PrudeLambert, Cameron T, MD [facility] [place], [facility]   In 4 months  [facility], [facility]             Refused Prescriptions Disp Refills   gabapentin  (NEURONTIN) 600 MG tablet [Pharmacy Med Name: GABAPENTIN 600 MG TABLET] 90 tablet 2    Sig: TAKE 1 TABLET (600 MG TOTAL) BY MOUTH 3 (THREE) TIMES DAILY AS NEEDED.     Neurology: Anticonvulsants - gabapentin Passed - 12/07/2021 10:26 PM      Passed - Cr in normal range and within 360 days    Creatinine, Ser  Date Value Ref Range Status  10/25/2021 0.92 0.57 - 1.00 mg/dL Final         Passed - Completed PHQ-2 or PHQ-9 in the last 360 days      Passed - Valid encounter within last 12 months    Recent Outpatient Visits           1 month ago Heart failure with preserved ejection fraction, unspecified HF chronicity (HCC)   [facility] Larae GroomsHoldsworth, Karen, NP   2 months ago Heart failure with preserved ejection fraction, unspecified HF chronicity (HCC)   [facility] Larae GroomsHoldsworth, Karen, NP   3 months ago Atelectasis   [facility] [place], Clydie BraunKaren, NP   3 months ago Persistent atrial fibrillation (HCC)   [facility] Larae GroomsHoldsworth, Karen, NP   3 months ago COVID-19   [facility] Larae GroomsHoldsworth, Karen, NP       Future Appointments             In 1 month Larae GroomsHoldsworth, Karen, NP [facility]   In 2 months Lalla BrothersLambert, Rossie Muskratameron T, MD [facility] [place], [facility]   In 4 months  [facility], [facility]              ELIQUIS 5 MG TABS tablet [Pharmacy Med Name: ELIQUIS 5 MG TABLET] 60 tablet 1    Sig: TAKE 1 TABLET BY MOUTH TWICE A DAY     Hematology:  Anticoagulants - apixaban Passed - 12/07/2021 10:26 PM      Passed - PLT in normal range and within 360 days    Platelets  Date Value Ref Range Status  10/10/2021 176 150 - 450 x10E3/uL Final         Passed - HGB in normal range and within 360 days    Hemoglobin  Date Value Ref Range Status  10/10/2021 13.1 11.1 - 15.9 g/dL Final         Passed - HCT in normal range and within 360 days    Hematocrit  Date Value Ref Range Status  10/10/2021 39.0  34.0 - 46.6 % Final         Passed - Cr in normal range and within 360 days    Creatinine, Ser  Date Value Ref Range Status  10/25/2021 0.92 0.57 - 1.00 mg/dL Final         Passed - AST in normal range and within 360 days    AST  Date Value Ref Range Status  10/25/2021 11 0 - 40 IU/L Final         Passed - ALT in normal range and within 360 days    ALT  Date Value Ref Range Status  10/25/2021 6 0 - 32 IU/L Final         Passed - Valid encounter within last 12 months    Recent Outpatient Visits           1 month ago Heart failure with preserved ejection fraction, unspecified HF chronicity (HCC)   [facility] Larae Grooms, NP   2 months ago Heart failure with preserved ejection fraction, unspecified HF chronicity (HCC)   [facility] Larae Grooms, NP   3 months ago Atelectasis   [facility] Lar[facility] Grooms, NP   3 months ago Persistent atrial fibrillation [facility])   [facility] Grooms, NP   3 months ago COVID-19   [facility] Larae Grooms, NP       Future Appointments             In 1 month Larae Grooms, NP [facility], [facility]   In 2 months Lanier Prude, MD [facility], [facility]   In 4 months  [facility], [facility]              fp

## 2021-12-15 ENCOUNTER — Telehealth: Payer: Self-pay | Admitting: Nurse Practitioner

## 2021-12-15 DIAGNOSIS — U071 COVID-19: Secondary | ICD-10-CM

## 2021-12-19 NOTE — Telephone Encounter (Signed)
Requested medication (s) are due for refill today - no  Requested medication (s) are on the active medication list -no  Future visit scheduled -yes  Last refill: Metoprolol- change in dose- dose requested no longer current                 Prednisone- non delegated Rx, discontinued medication   Notes to clinic: Unable to fill- see above  Requested Prescriptions  Pending Prescriptions Disp Refills   predniSONE (DELTASONE) 10 MG tablet [Pharmacy Med Name: PREDNISONE 10 MG TABLET] 21 tablet 0    Sig: Take 1 tablet (10 mg total) by mouth daily with breakfast.     Not Delegated - Endocrinology:  Oral Corticosteroids Failed - 12/15/2021  9:31 AM      Failed - This refill cannot be delegated      Failed - Manual Review: Eye exam for IOP if prolonged treatment      Failed - Glucose (serum) in normal range and within 180 days    Glucose  Date Value Ref Range Status  10/25/2021 105 (H) 70 - 99 mg/dL Final   Glucose, Bld  Date Value Ref Range Status  05/27/2021 123 (H) 70 - 99 mg/dL Final    Comment:    Glucose reference range applies only to samples taken after fasting for at least 8 hours.         Failed - Bone Mineral Density or Dexa Scan completed in the last 2 years      Passed - K in normal range and within 180 days    Potassium  Date Value Ref Range Status  10/25/2021 3.9 3.5 - 5.2 mmol/L Final         Passed - Na in normal range and within 180 days    Sodium  Date Value Ref Range Status  10/25/2021 144 134 - 144 mmol/L Final         Passed - Last BP in normal range    BP Readings from Last 1 Encounters:  11/13/21 100/64         Passed - Valid encounter within last 6 months    Recent Outpatient Visits           1 month ago Heart failure with preserved ejection fraction, unspecified HF chronicity (HCC)   [facility] Larae Grooms, NP   3 months ago Heart failure with preserved ejection fraction, unspecified HF chronicity (HCC)   [facility] Larae Grooms, NP   3 months ago Atelectasis   [facility], Clydie Braun, NP   3 months ago Persistent atrial fibrillation (HCC)   [facility] Larae Grooms, NP   4 months ago COVID-19   [facility] Lar[facility], NP       Future Appointments             In 1 month Larae Grooms, NP [facility], [facility]   In 1 month Lanier Prude, MD [facility], [facility]   In 3 months  [facility], [facility]              metoprolol succinate (TOPROL-XL) 50 MG 24 hr tablet [Pharmacy Med Name: METOPROLOL SUCC ER 50 MG TAB] 90 tablet 1    Sig: TAKE 1 TABLET BY MOUTH EVERY DAY     Cardiovascular:  Beta Blockers Passed - 12/15/2021  9:31 AM      Passed - Last BP in normal range    BP Readings from  Last 1 Encounters:  11/13/21 100/64         Passed - Last Heart Rate in normal range    Pulse Readings from Last 1 Encounters:  11/13/21 74         Passed - Valid encounter within last 6 months    Recent Outpatient Visits           1 month ago Heart failure with preserved ejection fraction, unspecified HF chronicity (HCC)   [facility] Larae GroomsHoldsworth, Karen, NP   3 months ago Heart failure with preserved ejection fraction, unspecified HF chronicity (HCC)   [facility] Larae GroomsHoldsworth, Karen, NP   3 months ago Atelectasis   [facility] Larae GroomsHoldsworth, Karen, NP   3 months ago Persistent atrial fibrillation (HCC)   [facility] Larae GroomsHoldsworth, Karen, NP   4 months ago COVID-19   [facility] [place], Clydie BraunKaren, NP       Future Appointments             In 1 month Larae GroomsHoldsworth, Karen, NP [facility], [facility]   In 1 month Lanier PrudeLambert, Cameron T, MD [facility] [place], [facility]   In 3 months  [facility], [facility]                Requested Prescriptions  Pending Prescriptions Disp Refills   predniSONE  (DELTASONE) 10 MG tablet [Pharmacy Med Name: PREDNISONE 10 MG TABLET] 21 tablet 0    Sig: Take 1 tablet (10 mg total) by mouth daily with breakfast.     Not Delegated - Endocrinology:  Oral Corticosteroids Failed - 12/15/2021  9:31 AM      Failed - This refill cannot be delegated      Failed - Manual Review: Eye exam for IOP if prolonged treatment      Failed - Glucose (serum) in normal range and within 180 days    Glucose  Date Value Ref Range Status  10/25/2021 105 (H) 70 - 99 mg/dL Final   Glucose, Bld  Date Value Ref Range Status  05/27/2021 123 (H) 70 - 99 mg/dL Final    Comment:    Glucose reference range applies only to samples taken after fasting for at least 8 hours.         Failed - Bone Mineral Density or Dexa Scan completed in the last 2 years      Passed - K in normal range and within 180 days    Potassium  Date Value Ref Range Status  10/25/2021 3.9 3.5 - 5.2 mmol/L Final         Passed - Na in normal range and within 180 days    Sodium  Date Value Ref Range Status  10/25/2021 144 134 - 144 mmol/L Final         Passed - Last BP in normal range    BP Readings from Last 1 Encounters:  11/13/21 100/64         Passed - Valid encounter within last 6 months    Recent Outpatient Visits           1 month ago Heart failure with preserved ejection fraction, unspecified HF chronicity (HCC)   [facility] Larae GroomsHoldsworth, Karen, NP   3 months ago Heart failure with preserved ejection fraction, unspecified HF chronicity (HCC)   [facility] Larae GroomsHoldsworth, Karen, NP   3 months ago Atelectasis   [facility] Larae GroomsHoldsworth, Karen, NP   3 months ago Persistent atrial fibrillation (  HCC)   [facility] Larae Grooms, NP   4 months ago COVID-19   [facility] Larae Grooms, NP       Future Appointments             In 1 month Larae Grooms, NP [facility], [facility]   In 1 month Lanier Prude, MD [facility], [facility]   In 3 months  [facility], [facility]              metoprolol succinate (TOPROL-XL) 50 MG 24 hr tablet [Pharmacy Med Name: METOPROLOL SUCC ER 50 MG TAB] 90 tablet 1    Sig: TAKE 1 TABLET BY MOUTH EVERY DAY     Cardiovascular:  Beta Blockers Passed - 12/15/2021  9:31 AM      Passed - Last BP in normal range    BP Readings from Last 1 Encounters:  11/13/21 100/64         Passed - Last Heart Rate in normal range    Pulse Readings from Last 1 Encounters:  11/13/21 74         Passed - Valid encounter within last 6 months    Recent Outpatient Visits           1 month ago Heart failure with preserved ejection fraction, unspecified HF chronicity (HCC)   [facility] Larae Grooms, NP   3 months ago Heart failure with preserved ejection fraction, unspecified HF chronicity (HCC)   [facility] Larae [facility], NP   3 months ago Atelectasis   [facility] Larae Grooms, NP   3 months ago Persistent atrial fibrillation [facility])   [facility] Larae Grooms, NP   4 months ago COVID-19   [facility] Larae Grooms, NP       Future Appointments             In 1 month Larae Grooms, NP [facility], [facility]   In 1 month Lalla Brothers, Rossie Muskrat, MD [facility], [facility]   In 3 months  [facility], [facility]

## 2021-12-20 DIAGNOSIS — D6869 Other thrombophilia: Secondary | ICD-10-CM | POA: Diagnosis not present

## 2021-12-20 DIAGNOSIS — Z9181 History of falling: Secondary | ICD-10-CM | POA: Diagnosis not present

## 2021-12-20 DIAGNOSIS — Z008 Encounter for other general examination: Secondary | ICD-10-CM | POA: Diagnosis not present

## 2021-12-20 DIAGNOSIS — Z6841 Body Mass Index (BMI) 40.0 and over, adult: Secondary | ICD-10-CM | POA: Diagnosis not present

## 2021-12-20 DIAGNOSIS — Z7901 Long term (current) use of anticoagulants: Secondary | ICD-10-CM | POA: Diagnosis not present

## 2021-12-20 DIAGNOSIS — M1990 Unspecified osteoarthritis, unspecified site: Secondary | ICD-10-CM | POA: Diagnosis not present

## 2021-12-20 DIAGNOSIS — Z825 Family history of asthma and other chronic lower respiratory diseases: Secondary | ICD-10-CM | POA: Diagnosis not present

## 2021-12-20 DIAGNOSIS — I110 Hypertensive heart disease with heart failure: Secondary | ICD-10-CM | POA: Diagnosis not present

## 2021-12-20 DIAGNOSIS — I4891 Unspecified atrial fibrillation: Secondary | ICD-10-CM | POA: Diagnosis not present

## 2021-12-20 DIAGNOSIS — E261 Secondary hyperaldosteronism: Secondary | ICD-10-CM | POA: Diagnosis not present

## 2021-12-20 DIAGNOSIS — I509 Heart failure, unspecified: Secondary | ICD-10-CM | POA: Diagnosis not present

## 2021-12-20 DIAGNOSIS — G629 Polyneuropathy, unspecified: Secondary | ICD-10-CM | POA: Diagnosis not present

## 2021-12-22 ENCOUNTER — Other Ambulatory Visit: Payer: Self-pay

## 2021-12-22 MED ORDER — GABAPENTIN 600 MG PO TABS: 600.0000 mg | ORAL_TABLET | Freq: Three times a day (TID) | ORAL | 1 refills | Status: DC | PRN

## 2021-12-22 MED ORDER — APIXABAN 5 MG PO TABS: 5.0000 mg | ORAL_TABLET | Freq: Two times a day (BID) | ORAL | 1 refills | Status: DC

## 2021-12-22 NOTE — Telephone Encounter (Signed)
Pt called in to follow up on Rx refill requests, please advise.

## 2021-12-22 NOTE — Telephone Encounter (Signed)
Attempted to return call to patient no answer LVM to call office back.

## 2021-12-22 NOTE — Telephone Encounter (Signed)
Patient returned call, patient states she is requesting Eliquis and Gabapentin. [facility] [place] will initiate a new Rx refill and sent to provider.

## 2021-12-22 NOTE — Telephone Encounter (Signed)
Patient called for update on medication refill, per patient she did not request Metoprolol or Eliquis. [facility] [place] [facility], requesting Eliquis and Gabapentin. Per patient did not receive mediation in January due to medications being sent to the wrong pharmacy.

## 2022-01-09 ENCOUNTER — Telehealth: Payer: Self-pay

## 2022-01-09 ENCOUNTER — Telehealth: Payer: Medicare HMO

## 2022-01-09 NOTE — Telephone Encounter (Signed)
  Care Management   Follow Up Note   01/09/2022 Name: [name] MRN: [id number] DOB: May 30, 1951   Referred by: Larae Grooms, NP Reason for referral : Chronic Care Management (RNCM: Follow up for Chronic Disease Management and Care Coordination Needs )   An unsuccessful telephone outreach was attempted today. The patient was referred to the case management team for assistance with care management and care coordination.   Follow Up Plan: A HIPPA compliant phone message was left for the patient providing contact information and requesting a return call.   Alto Denver RN, MSN, CCM Community Care Coordinator   [facility] Mobile: [phone]

## 2022-01-24 ENCOUNTER — Ambulatory Visit: Payer: Medicare HMO | Admitting: Nurse Practitioner

## 2022-02-14 ENCOUNTER — Ambulatory Visit: Payer: Medicare HMO | Admitting: Cardiology

## 2022-02-16 ENCOUNTER — Other Ambulatory Visit: Payer: Self-pay | Admitting: Nurse Practitioner

## 2022-02-16 DIAGNOSIS — U071 COVID-19: Secondary | ICD-10-CM

## 2022-02-19 NOTE — Telephone Encounter (Signed)
Requested medications are due for refill today.  no  Requested medications are on the active medications list.  no  Last refill. 08/11/2021 #21 0 refills  Future visit scheduled.   yes  Notes to clinic.  Medication was discontinued 08/25/2021. Refill not delegated.    Requested Prescriptions  Pending Prescriptions Disp Refills   predniSONE (DELTASONE) 10 MG tablet [Pharmacy Med Name: PREDNISONE 10 MG TABLET] 21 tablet 0    Sig: Take 1 tablet (10 mg total) by mouth daily with breakfast.     Not Delegated - Endocrinology:  Oral Corticosteroids Failed - 02/16/2022 12:24 PM      Failed - This refill cannot be delegated      Failed - Manual Review: Eye exam for IOP if prolonged treatment      Failed - Glucose (serum) in normal range and within 180 days    Glucose  Date Value Ref Range Status  10/25/2021 105 (H) 70 - 99 mg/dL Final   Glucose, Bld  Date Value Ref Range Status  05/27/2021 123 (H) 70 - 99 mg/dL Final    Comment:    Glucose reference range applies only to samples taken after fasting for at least 8 hours.         Failed - Bone Mineral Density or Dexa Scan completed in the last 2 years      Passed - K in normal range and within 180 days    Potassium  Date Value Ref Range Status  10/25/2021 3.9 3.5 - 5.2 mmol/L Final         Passed - Na in normal range and within 180 days    Sodium  Date Value Ref Range Status  10/25/2021 144 134 - 144 mmol/L Final         Passed - Last BP in normal range    BP Readings from Last 1 Encounters:  11/13/21 100/64         Passed - Valid encounter within last 6 months    Recent Outpatient Visits           3 months ago Heart failure with preserved ejection fraction, unspecified HF chronicity (HCC)   [facility] Larae Grooms, NP   5 months ago Heart failure with preserved ejection fraction, unspecified HF chronicity (HCC)   [facility] Larae Grooms, NP   5 months ago Atelectasis   [facility] Larae Grooms, NP   5 months ago Persistent atrial fibrillation [facility])   [facility] Larae Grooms, NP   6 months ago COVID-19   [facility] Larae Grooms, NP       Future Appointments             In 1 month [facility]

## 2022-04-12 ENCOUNTER — Ambulatory Visit (INDEPENDENT_AMBULATORY_CARE_PROVIDER_SITE_OTHER): Payer: Medicare HMO | Admitting: *Deleted

## 2022-04-12 DIAGNOSIS — Z1231 Encounter for screening mammogram for malignant neoplasm of breast: Secondary | ICD-10-CM

## 2022-04-12 DIAGNOSIS — Z780 Asymptomatic menopausal state: Secondary | ICD-10-CM | POA: Diagnosis not present

## 2022-04-12 DIAGNOSIS — Z1211 Encounter for screening for malignant neoplasm of colon: Secondary | ICD-10-CM

## 2022-04-12 DIAGNOSIS — Z0000 Encounter for general adult medical examination without abnormal findings: Secondary | ICD-10-CM | POA: Diagnosis not present

## 2022-04-12 NOTE — Progress Notes (Signed)
Subjective:   [name] is a 71 y.o. female who presents for Medicare Annual (Subsequent) preventive examination.  I connected with  [name] on 04/12/22 by a telephone enabled telemedicine application and verified that I am speaking with the correct person using two identifiers.   I discussed the limitations of evaluation and management by telemedicine. The patient expressed understanding and agreed to proceed.  Patient location: home  Provider location: Tele-Health-home    Review of Systems     Cardiac Risk Factors include: advanced age (>3255men, 28>65 women);family history of premature cardiovascular disease;sedentary lifestyle;obesity (BMI >30kg/m2)     Objective:    Today's Vitals   There is no height or weight on file to calculate BMI.     04/12/2022    9:07 AM 05/27/2021    7:31 AM 04/10/2021    9:01 AM 04/08/2020    9:00 AM 02/19/2020   10:28 AM 12/13/2019   11:09 AM 12/19/2016    9:56 PM  Advanced Directives  Does Patient Have a Medical Advance Directive? No No No No No No No  Would patient like information on creating a medical advance directive? No - Patient declined No - Patient declined   No - Patient declined      Current Medications (verified) Outpatient Encounter Medications as of 04/12/2022  Medication Sig   Acetaminophen (TYLENOL 8 HOUR PO) Take by mouth as needed.   apixaban (ELIQUIS) 5 MG TABS tablet Take 1 tablet (5 mg total) by mouth 2 (two) times daily.   gabapentin (NEURONTIN) 600 MG tablet Take 1 tablet (600 mg total) by mouth 3 (three) times daily as needed.   ondansetron (ZOFRAN) 4 MG tablet Take 1 tablet (4 mg total) by mouth every 8 (eight) hours as needed for nausea or vomiting.   Spacer/Aero-Holding [facility](OPTICHAMBER DIAMOND) MISC by Does not apply route.   albuterol (VENTOLIN HFA) 108 (90 Base) MCG/ACT inhaler Inhale 1-2 puffs into the lungs every 6 (six) hours as needed for wheezing or shortness of breath.   metoprolol  succinate (TOPROL-XL) 100 MG 24 hr tablet Take 1 tablet (100 mg total) by mouth in the morning and at bedtime. Take with or immediately following a meal.   torsemide (DEMADEX) 20 MG tablet TAKE 1 TABLET BY MOUTH EVERY DAY (Patient not taking: Reported on 04/12/2022)   No facility-administered encounter medications on file as of 04/12/2022.    Allergies (verified) Patient has no known allergies.   History: Past Medical History:  Diagnosis Date   History of shingles    Knee pain    Migraines    Past Surgical History:  Procedure Laterality Date   COLONOSCOPY WITH PROPOFOL N/A 02/19/2020   Procedure: COLONOSCOPY WITH PROPOFOL;  Surgeon: Pasty Spillersahiliani, Varnita B, MD;  Location: [facility] ENDOSCOPY;  Service: Endoscopy;  Laterality: N/A;   REPLACEMENT TOTAL KNEE     left knee   TUBAL LIGATION     Family History  Problem Relation Age of Onset   Heart disease Mother    Hypertension Mother    Cancer Father    Cancer Brother    Social History   Socioeconomic History   Marital status: Widowed    Spouse name: Not on file   Number of children: Not on file   Years of education: Not on file   Highest education level: Not on file  Occupational History   Occupation: retired  Tobacco Use   Smoking status: Never   Smokeless tobacco: Never  Vaping Use  Vaping Use: Never used  Substance and Sexual Activity   Alcohol use: No   Drug use: No   Sexual activity: Not Currently  Other Topics Concern   Not on file  Social History Narrative   Not on file   Social Determinants of Health   Financial Resource Strain: Low Risk  (04/12/2022)   Overall Financial Resource Strain (CARDIA)    Difficulty of Paying Living Expenses: Not hard at all  Food Insecurity: No Food Insecurity (04/12/2022)   Hunger Vital Sign    Worried About Running Out of Food in the Last Year: Never true    Ran Out of Food in the Last Year: Never true  Transportation Needs: No Transportation Needs (04/12/2022)   PRAPARE -  Administrator, Civil Service (Medical): No    Lack of Transportation (Non-Medical): No  Physical Activity: Inactive (04/12/2022)   Exercise Vital Sign    Days of Exercise per Week: 0 days    Minutes of Exercise per Session: 0 min  Stress: No Stress Concern Present (04/12/2022)   [employer] of Occupational Health - Occupational Stress Questionnaire    Feeling of Stress : Only a little  Social Connections: Socially Isolated (04/12/2022)   Social Connection and Isolation Panel [NHANES]    Frequency of Communication with Friends and Family: More than three times a week    Frequency of Social Gatherings with Friends and Family: Once a week    Attends Religious Services: Never    Database administrator or Organizations: No    Attends Banker Meetings: Never    Marital Status: Widowed    Tobacco Counseling Counseling given: Not Answered   Clinical Intake:  Pre-visit preparation completed: Yes  Pain : No/denies pain     Diabetes: No  How often do you need to have someone help you when you read instructions, pamphlets, or other written materials from your doctor or pharmacy?: 1 - Never  Diabetic?  no  Interpreter Needed?: No  Information entered by :: Remi Haggard LPN   Activities of Daily Living    04/12/2022    9:20 AM  In your present state of health, do you have any difficulty performing the following activities:  Hearing? 0  Vision? 0  Difficulty concentrating or making decisions? 0  Walking or climbing stairs? 0  Dressing or bathing? 0  Doing errands, shopping? 0  Preparing Food and eating ? N  Using the Toilet? N  In the past six months, have you accidently leaked urine? N  Do you have problems with loss of bowel control? N  Managing your Medications? N  Managing your Finances? N  Housekeeping or managing your Housekeeping? N    Patient Care Team: Larae Grooms, NP as PCP - General Lanier Prude, MD as PCP -  Electrophysiology (Cardiology)  Indicate any recent Medical Services you may have received from other than [facility] providers in the past year (date may be approximate).     Assessment:   This is a routine wellness examination for [place].  Hearing/Vision screen Hearing Screening - Comments:: No trouble hearing Vision Screening - Comments:: Not up to date  Dietary issues and exercise activities discussed: Current Exercise Habits: The patient does not participate in regular exercise at present   Goals Addressed             This Visit's Progress    Patient Stated   On track    04/10/2021, no goals  Patient Stated       No goals        Depression Screen    04/12/2022    9:14 AM 10/25/2021    2:40 PM 09/04/2021    1:31 PM 07/26/2021    1:45 PM 04/10/2021    9:02 AM 01/27/2021    9:53 AM 06/22/2020    4:04 PM  PHQ 2/9 Scores  PHQ - 2 Score 2 0 0 0 0 5 5  PHQ- 9 Score 4 1 4  0  8 8    Fall Risk    04/12/2022    9:08 AM 10/25/2021    2:40 PM 09/04/2021    1:31 PM 07/26/2021    1:45 PM 04/10/2021    9:01 AM  Fall Risk   Falls in the past year? 0 0 1 1 1   Comment     lost balance  Number falls in past yr: 0 0 1 1 1   Injury with Fall? 0 0 0 0 0  Risk for fall due to :  No Fall Risks History of fall(s) Impaired balance/gait History of fall(s);Medication side effect  Follow up Falls evaluation completed;Falls prevention discussed;Education provided Falls evaluation completed Falls evaluation completed Falls evaluation completed Falls evaluation completed;Education provided;Falls prevention discussed    FALL RISK PREVENTION PERTAINING TO THE HOME:  Any stairs in or around the home? No  If so, are there any without handrails? No  Home free of loose throw rugs in walkways, pet beds, electrical cords, etc? Yes  Adequate lighting in your home to reduce risk of falls? Yes   ASSISTIVE DEVICES UTILIZED TO PREVENT FALLS:  Life alert? No  Use of a cane, walker or w/c? Yes  Grab bars  in the bathroom? No  Shower chair or bench in shower? Yes  Elevated toilet seat or a handicapped toilet? No   TIMED UP AND GO:  Was the test performed? No .    Cognitive Function:        04/12/2022    9:09 AM 04/10/2021    9:03 AM 04/08/2020    9:06 AM  6CIT Screen  What Year? 0 points 0 points 0 points  What month? 0 points 0 points 0 points  What time? 0 points 0 points 0 points  Count back from 20 0 points 0 points 2 points  Months in reverse 4 points 4 points 4 points  Repeat phrase 0 points 6 points 0 points  Total Score 4 points 10 points 6 points    Immunizations Immunization History  Administered Date(s) Administered   Zoster Recombinat (Shingrix) 04/16/2020    TDAP status: Due, Education has been provided regarding the importance of this vaccine. Advised may receive this vaccine at local pharmacy or Health Dept. Aware to provide a copy of the vaccination record if obtained from local pharmacy or Health Dept. Verbalized acceptance and understanding.  Flu Vaccine status: Declined, Education has been provided regarding the importance of this vaccine but patient still declined. Advised may receive this vaccine at local pharmacy or Health Dept. Aware to provide a copy of the vaccination record if obtained from local pharmacy or Health Dept. Verbalized acceptance and understanding.  Pneumococcal vaccine status: Declined,  Education has been provided regarding the importance of this vaccine but patient still declined. Advised may receive this vaccine at local pharmacy or Health Dept. Aware to provide a copy of the vaccination record if obtained from local pharmacy or Health Dept. Verbalized acceptance and understanding.   Covid-19  vaccine status: Declined, Education has been provided regarding the importance of this vaccine but patient still declined. Advised may receive this vaccine at local pharmacy or Health Dept.or vaccine clinic. Aware to provide a copy of the vaccination  record if obtained from local pharmacy or Health Dept. Verbalized acceptance and understanding.  Qualifies for Shingles Vaccine? Yes   Zostavax completed No   Shingrix Completed?: No.    Education has been provided regarding the importance of this vaccine. Patient has been advised to call insurance company to determine out of pocket expense if they have not yet received this vaccine. Advised may also receive vaccine at local pharmacy or Health Dept. Verbalized acceptance and understanding.  Screening Tests Health Maintenance  Topic Date Due   DEXA SCAN  Never done   COLONOSCOPY (Pts 45-48yrs Insurance coverage will need to be confirmed)  02/18/2021   MAMMOGRAM  02/14/2022   COVID-19 Vaccine (1) 04/28/2022 (Originally 03/07/1951)   Zoster Vaccines- Shingrix (2 of 2) 07/12/2022 (Originally 06/11/2020)   Pneumonia Vaccine 39+ Years old (1 - PCV) 07/26/2022 (Originally 09/07/2016)   INFLUENZA VACCINE  10/21/2022 (Originally 02/20/2022)   TETANUS/TDAP  04/13/2023 (Originally 09/07/1969)   Hepatitis C Screening  Completed   HPV VACCINES  Aged Out    Health Maintenance  Health Maintenance Due  Topic Date Due   DEXA SCAN  Never done   COLONOSCOPY (Pts 45-58yrs Insurance coverage will need to be confirmed)  02/18/2021   MAMMOGRAM  02/14/2022    Colorectal cancer screening: Referral to GI placed  . Pt aware the office will call re: appt.  Mammogram status: Ordered  . Pt provided with contact info and advised to call to schedule appt.   Bone Density status: Ordered  . Pt provided with contact info and advised to call to schedule appt.  Lung Cancer Screening: (Low Dose CT Chest recommended if Age 27-80 years, 30 pack-year currently smoking OR have quit w/in 15years.) does not qualify.   Lung Cancer Screening Referral:   Additional Screening:  Hepatitis C Screening: does not qualify; Completed 2021  Vision Screening: Recommended annual ophthalmology exams for early detection of glaucoma and  other disorders of the eye. Is the patient up to date with their annual eye exam?  No  Who is the provider or what is the name of the office in which the patient attends annual eye exams? [facility] If pt is not established with a provider, would they like to be referred to a provider to establish care? No .   Dental Screening: Recommended annual dental exams for proper oral hygiene  Community Resource Referral / Chronic Care Management: CRR required this visit?  No   CCM required this visit?  No      Plan:     I have personally reviewed and noted the following in the patient's chart:   Medical and social history Use of alcohol, tobacco or illicit drugs  Current medications and supplements including opioid prescriptions. Patient is not currently taking opioid prescriptions. Functional ability and status Nutritional status Physical activity Advanced directives List of other physicians Hospitalizations, surgeries, and ER visits in previous 12 months Vitals Screenings to include cognitive, depression, and falls Referrals and appointments  In addition, I have reviewed and discussed with patient certain preventive protocols, quality metrics, and best practice recommendations. A written personalized care plan for preventive services as well as general preventive health recommendations were provided to patient.     Remi Haggard, LPN   5/68/1275  Nurse Notes:

## 2022-04-12 NOTE — Patient Instructions (Signed)
[name] , Thank you for taking time to come for your Medicare Wellness Visit. I appreciate your ongoing commitment to your health goals. Please review the following plan we discussed and let me know if I can assist you in the future.   Screening recommendations/referrals: Colonoscopy: Education provided Mammogram: Education provided Bone Density: Education provided Recommended yearly ophthalmology/optometry visit for glaucoma screening and checkup Recommended yearly dental visit for hygiene and checkup  Vaccinations: Influenza vaccine: Education provided Pneumococcal vaccine: Education provided Tdap vaccine: Education provided Shingles vaccine: Education provided    Advanced directives: Education provided     Preventive Care 70 Years and Older, Female Preventive care refers to lifestyle choices and visits with your health care provider that can promote health and wellness. What does preventive care include? A yearly physical exam. This is also called an annual well check. Dental exams once or twice a year. Routine eye exams. Ask your health care provider how often you should have your eyes checked. Personal lifestyle choices, including: Daily care of your teeth and gums. Regular physical activity. Eating a healthy diet. Avoiding tobacco and drug use. Limiting alcohol use. Practicing safe sex. Taking low-dose aspirin every day. Taking vitamin and mineral supplements as recommended by your health care provider. What happens during an annual well check? The services and screenings done by your health care provider during your annual well check will depend on your age, overall health, lifestyle risk factors, and family history of disease. Counseling  Your health care provider may ask you questions about your: Alcohol use. Tobacco use. Drug use. Emotional well-being. Home and relationship well-being. Sexual activity. Eating habits. History of falls. Memory and ability to  understand (cognition). Work and work Statistician. Reproductive health. Screening  You may have the following tests or measurements: Height, weight, and BMI. Blood pressure. Lipid and cholesterol levels. These may be checked every 5 years, or more frequently if you are over 40 years old. Skin check. Lung cancer screening. You may have this screening every year starting at age 70 if you have a 30-pack-year history of smoking and currently smoke or have quit within the past 15 years. Fecal occult blood test (FOBT) of the stool. You may have this test every year starting at age 70. Flexible sigmoidoscopy or colonoscopy. You may have a sigmoidoscopy every 5 years or a colonoscopy every 10 years starting at age 70. Hepatitis C blood test. Hepatitis B blood test. Sexually transmitted disease (STD) testing. Diabetes screening. This is done by checking your blood sugar (glucose) after you have not eaten for a while (fasting). You may have this done every 1-3 years. Bone density scan. This is done to screen for osteoporosis. You may have this done starting at age 70. Mammogram. This may be done every 70-2 years. Talk to your health care provider about how often you should have regular mammograms. Talk with your health care provider about your test results, treatment options, and if necessary, the need for more tests. Vaccines  Your health care provider may recommend certain vaccines, such as: Influenza vaccine. This is recommended every year. Tetanus, diphtheria, and acellular pertussis (Tdap, Td) vaccine. You may need a Td booster every 10 years. Zoster vaccine. You may need this after age 70. Pneumococcal 13-valent conjugate (PCV13) vaccine. One dose is recommended after age 70. Pneumococcal polysaccharide (PPSV23) vaccine. One dose is recommended after age 70. Talk to your health care provider about which screenings and vaccines you need and how often you need them. This information  is not  intended to replace advice given to you by your health care provider. Make sure you discuss any questions you have with your health care provider. Document Released: 08/05/2015 Document Revised: 03/28/2016 Document Reviewed: 05/10/2015 Elsevier Interactive Patient Education  2017 [place] Prevention in the Home Falls can cause injuries. They can happen to people of all ages. There are many things you can do to make your home safe and to help prevent falls. What can I do on the outside of my home? Regularly fix the edges of walkways and driveways and fix any cracks. Remove anything that might make you trip as you walk through a door, such as a raised step or threshold. Trim any bushes or trees on the path to your home. Use bright outdoor lighting. Clear any walking paths of anything that might make someone trip, such as rocks or tools. Regularly check to see if handrails are loose or broken. Make sure that both sides of any steps have handrails. Any raised decks and porches should have guardrails on the edges. Have any leaves, snow, or ice cleared regularly. Use sand or salt on walking paths during winter. Clean up any spills in your garage right away. This includes oil or grease spills. What can I do in the bathroom? Use night lights. Install grab bars by the toilet and in the tub and shower. Do not use towel bars as grab bars. Use non-skid mats or decals in the tub or shower. If you need to sit down in the shower, use a plastic, non-slip stool. Keep the floor dry. Clean up any water that spills on the floor as soon as it happens. Remove soap buildup in the tub or shower regularly. Attach bath mats securely with double-sided non-slip rug tape. Do not have throw rugs and other things on the floor that can make you trip. What can I do in the bedroom? Use night lights. Make sure that you have a light by your bed that is easy to reach. Do not use any sheets or blankets that are  too big for your bed. They should not hang down onto the floor. Have a firm chair that has side arms. You can use this for support while you get dressed. Do not have throw rugs and other things on the floor that can make you trip. What can I do in the kitchen? Clean up any spills right away. Avoid walking on wet floors. Keep items that you use a lot in easy-to-reach places. If you need to reach something above you, use a strong step stool that has a grab bar. Keep electrical cords out of the way. Do not use floor polish or wax that makes floors slippery. If you must use wax, use non-skid floor wax. Do not have throw rugs and other things on the floor that can make you trip. What can I do with my stairs? Do not leave any items on the stairs. Make sure that there are handrails on both sides of the stairs and use them. Fix handrails that are broken or loose. Make sure that handrails are as long as the stairways. Check any carpeting to make sure that it is firmly attached to the stairs. Fix any carpet that is loose or worn. Avoid having throw rugs at the top or bottom of the stairs. If you do have throw rugs, attach them to the floor with carpet tape. Make sure that you have a light switch at the top of  the stairs and the bottom of the stairs. If you do not have them, ask someone to add them for you. What else can I do to help prevent falls? Wear shoes that: Do not have high heels. Have rubber bottoms. Are comfortable and fit you well. Are closed at the toe. Do not wear sandals. If you use a stepladder: Make sure that it is fully opened. Do not climb a closed stepladder. Make sure that both sides of the stepladder are locked into place. Ask someone to hold it for you, if possible. Clearly mark and make sure that you can see: Any grab bars or handrails. First and last steps. Where the edge of each step is. Use tools that help you move around (mobility aids) if they are needed. These  include: Canes. Walkers. Scooters. Crutches. Turn on the lights when you go into a dark area. Replace any light bulbs as soon as they burn out. Set up your furniture so you have a clear path. Avoid moving your furniture around. If any of your floors are uneven, fix them. If there are any pets around you, be aware of where they are. Review your medicines with your doctor. Some medicines can make you feel dizzy. This can increase your chance of falling. Ask your doctor what other things that you can do to help prevent falls. This information is not intended to replace advice given to you by your health care provider. Make sure you discuss any questions you have with your health care provider. Document Released: 05/05/2009 Document Revised: 12/15/2015 Document Reviewed: 08/13/2014 Elsevier Interactive Patient Education  2017 [employer].

## 2022-04-13 ENCOUNTER — Other Ambulatory Visit: Payer: Self-pay | Admitting: Nurse Practitioner

## 2022-04-13 ENCOUNTER — Ambulatory Visit: Payer: Medicare HMO

## 2022-04-13 DIAGNOSIS — Z1231 Encounter for screening mammogram for malignant neoplasm of breast: Secondary | ICD-10-CM

## 2022-04-13 DIAGNOSIS — Z13820 Encounter for screening for osteoporosis: Secondary | ICD-10-CM

## 2022-04-13 NOTE — Progress Notes (Signed)
Orders for mammogram and bone density placed.

## 2022-05-02 ENCOUNTER — Other Ambulatory Visit: Payer: Self-pay | Admitting: Nurse Practitioner

## 2022-05-02 DIAGNOSIS — U071 COVID-19: Secondary | ICD-10-CM

## 2022-05-03 NOTE — Telephone Encounter (Signed)
Unable to refill per protocol, Rx expired.Medication was discontinued 08/25/21 by PCP. Will refuse.   Requested Prescriptions  Pending Prescriptions Disp Refills  . predniSONE (DELTASONE) 10 MG tablet [Pharmacy Med Name: PREDNISONE 10 MG TABLET] 21 tablet 0    Sig: TAKE 1 TABLET (10 MG TOTAL) BY MOUTH DAILY WITH BREAKFAST.     Not Delegated - Endocrinology:  Oral Corticosteroids Failed - 05/02/2022  4:17 PM      Failed - This refill cannot be delegated      Failed - Manual Review: Eye exam for IOP if prolonged treatment      Failed - Glucose (serum) in normal range and within 180 days    Glucose  Date Value Ref Range Status  10/25/2021 105 (H) 70 - 99 mg/dL Final   Glucose, Bld  Date Value Ref Range Status  05/27/2021 123 (H) 70 - 99 mg/dL Final    Comment:    Glucose reference range applies only to samples taken after fasting for at least 8 hours.         Failed - K in normal range and within 180 days    Potassium  Date Value Ref Range Status  10/25/2021 3.9 3.5 - 5.2 mmol/L Final         Failed - Na in normal range and within 180 days    Sodium  Date Value Ref Range Status  10/25/2021 144 134 - 144 mmol/L Final         Failed - Bone Mineral Density or Dexa Scan completed in the last 2 years      Passed - Last BP in normal range    BP Readings from Last 1 Encounters:  11/13/21 100/64         Passed - Valid encounter within last 6 months    Recent Outpatient Visits          6 months ago Heart failure with preserved ejection fraction, unspecified HF chronicity ([place])   [facility] Jon Billings, NP   7 months ago Heart failure with preserved ejection fraction, unspecified HF chronicity ([place])   [facility] Jon Billings, NP   8 months ago Atelectasis   [facility] Jon Billings, NP   8 months ago Persistent atrial fibrillation [facility])   [facility] Jon Billings, NP   8 months ago [place], Karen, NP      Future Appointments            In 59 months [facility], [facility]

## 2022-05-09 ENCOUNTER — Encounter: Payer: Self-pay | Admitting: Nurse Practitioner

## 2022-05-25 ENCOUNTER — Other Ambulatory Visit: Payer: Self-pay | Admitting: Nurse Practitioner

## 2022-05-25 DIAGNOSIS — U071 COVID-19: Secondary | ICD-10-CM

## 2022-05-28 ENCOUNTER — Other Ambulatory Visit: Payer: Self-pay | Admitting: Nurse Practitioner

## 2022-05-28 DIAGNOSIS — U071 COVID-19: Secondary | ICD-10-CM

## 2022-05-28 NOTE — Telephone Encounter (Signed)
Requested Prescriptions  Pending Prescriptions Disp Refills   predniSONE (DELTASONE) 10 MG tablet [Pharmacy Med Name: PREDNISONE 10 MG TABLET] 21 tablet 0    Sig: TAKE 1 TABLET (10 MG TOTAL) BY MOUTH DAILY WITH BREAKFAST.     Not Delegated - Endocrinology:  Oral Corticosteroids Failed - 05/25/2022  6:31 PM      Failed - This refill cannot be delegated      Failed - Manual Review: Eye exam for IOP if prolonged treatment      Failed - Glucose (serum) in normal range and within 180 days    Glucose  Date Value Ref Range Status  10/25/2021 105 (H) 70 - 99 mg/dL Final   Glucose, Bld  Date Value Ref Range Status  05/27/2021 123 (H) 70 - 99 mg/dL Final    Comment:    Glucose reference range applies only to samples taken after fasting for at least 8 hours.         Failed - K in normal range and within 180 days    Potassium  Date Value Ref Range Status  10/25/2021 3.9 3.5 - 5.2 mmol/L Final         Failed - Na in normal range and within 180 days    Sodium  Date Value Ref Range Status  10/25/2021 144 134 - 144 mmol/L Final         Failed - Bone Mineral Density or Dexa Scan completed in the last 2 years      Passed - Last BP in normal range    BP Readings from Last 1 Encounters:  11/13/21 100/64         Passed - Valid encounter within last 6 months    Recent Outpatient Visits           7 months ago Heart failure with preserved ejection fraction, unspecified HF chronicity ([place])   [facility] Jon Billings, NP   8 months ago Heart failure with preserved ejection fraction, unspecified HF chronicity ([place])   [facility] Jon Billings, NP   8 months ago Atelectasis   [facility] Jon Billings, NP   9 months ago Persistent atrial fibrillation [facility])   [facility] Jon Billings, NP   9 months ago [place], Karen, NP       Future Appointments             In 10 months  [facility]

## 2022-05-29 NOTE — Telephone Encounter (Signed)
Requested medication (s) are due for refill today - no  Requested medication (s) are on the active medication list -no  Future visit scheduled -yes  Last refill: 08/11/21  Notes to clinic: non delegated Rx- no longer on current medication list  Requested Prescriptions  Pending Prescriptions Disp Refills   predniSONE (DELTASONE) 10 MG tablet [Pharmacy Med Name: PREDNISONE 10 MG TABLET] 21 tablet 0    Sig: Take 1 tablet (10 mg total) by mouth daily with breakfast.     Not Delegated - Endocrinology:  Oral Corticosteroids Failed - 05/28/2022  4:12 PM      Failed - This refill cannot be delegated      Failed - Manual Review: Eye exam for IOP if prolonged treatment      Failed - Glucose (serum) in normal range and within 180 days    Glucose  Date Value Ref Range Status  10/25/2021 105 (H) 70 - 99 mg/dL Final   Glucose, Bld  Date Value Ref Range Status  05/27/2021 123 (H) 70 - 99 mg/dL Final    Comment:    Glucose reference range applies only to samples taken after fasting for at least 8 hours.         Failed - K in normal range and within 180 days    Potassium  Date Value Ref Range Status  10/25/2021 3.9 3.5 - 5.2 mmol/L Final         Failed - Na in normal range and within 180 days    Sodium  Date Value Ref Range Status  10/25/2021 144 134 - 144 mmol/L Final         Failed - Bone Mineral Density or Dexa Scan completed in the last 2 years      Passed - Last BP in normal range    BP Readings from Last 1 Encounters:  11/13/21 100/64         Passed - Valid encounter within last 6 months    Recent Outpatient Visits           7 months ago Heart failure with preserved ejection fraction, unspecified HF chronicity ([place])   [facility] Jon Billings, NP   8 months ago Heart failure with preserved ejection fraction, unspecified HF chronicity ([place])   [facility] Jon Billings, NP   8 months ago Atelectasis   [facility]  Jon Billings, NP   9 months ago Persistent atrial fibrillation ([place])   [facility] Jon Billings, NP   9 months ago COVID-30   [facility] [place], Santiago Glad, NP       Future Appointments             In 10 months [place]                Requested Prescriptions  Pending Prescriptions Disp Refills   predniSONE (DELTASONE) 10 MG tablet [Pharmacy Med Name: PREDNISONE 10 MG TABLET] 21 tablet 0    Sig: Take 1 tablet (10 mg total) by mouth daily with breakfast.     Not Delegated - Endocrinology:  Oral Corticosteroids Failed - 05/28/2022  4:12 PM      Failed - This refill cannot be delegated      Failed - Manual Review: Eye exam for IOP if prolonged treatment      Failed - Glucose (serum) in normal range and within 180 days    Glucose  Date Value Ref Range Status  10/25/2021 105 (H)  70 - 99 mg/dL Final   Glucose, Bld  Date Value Ref Range Status  05/27/2021 123 (H) 70 - 99 mg/dL Final    Comment:    Glucose reference range applies only to samples taken after fasting for at least 8 hours.         Failed - K in normal range and within 180 days    Potassium  Date Value Ref Range Status  10/25/2021 3.9 3.5 - 5.2 mmol/L Final         Failed - Na in normal range and within 180 days    Sodium  Date Value Ref Range Status  10/25/2021 144 134 - 144 mmol/L Final         Failed - Bone Mineral Density or Dexa Scan completed in the last 2 years      Passed - Last BP in normal range    BP Readings from Last 1 Encounters:  11/13/21 100/64         Passed - Valid encounter within last 6 months    Recent Outpatient Visits           7 months ago Heart failure with preserved ejection fraction, unspecified HF chronicity ([place])   [facility] Jon Billings, NP   8 months ago Heart failure with preserved ejection fraction, unspecified HF chronicity ([place])   [facility] Jon Billings, NP   8  months ago Atelectasis   [facility] Jon Billings, NP   9 months ago Persistent atrial fibrillation [facility])   [facility] Jon Billings, NP   9 months ago [place], Karen, NP       Future Appointments             In 10 months [facility], [facility]

## 2022-06-12 ENCOUNTER — Other Ambulatory Visit: Payer: Medicare HMO

## 2022-08-31 IMAGING — CR DG CHEST 2V
1 series · 2 of 2 positions shown · non-contrast
Comparison: Previous studies done on 05/27/2021

CLINICAL DATA: Shortness of breath

EXAM:
CHEST - 2 VIEW

[Series 1: dg chest 2 view · 0.14mm/px · 2 of 2 slices shown]
[im 1/2]
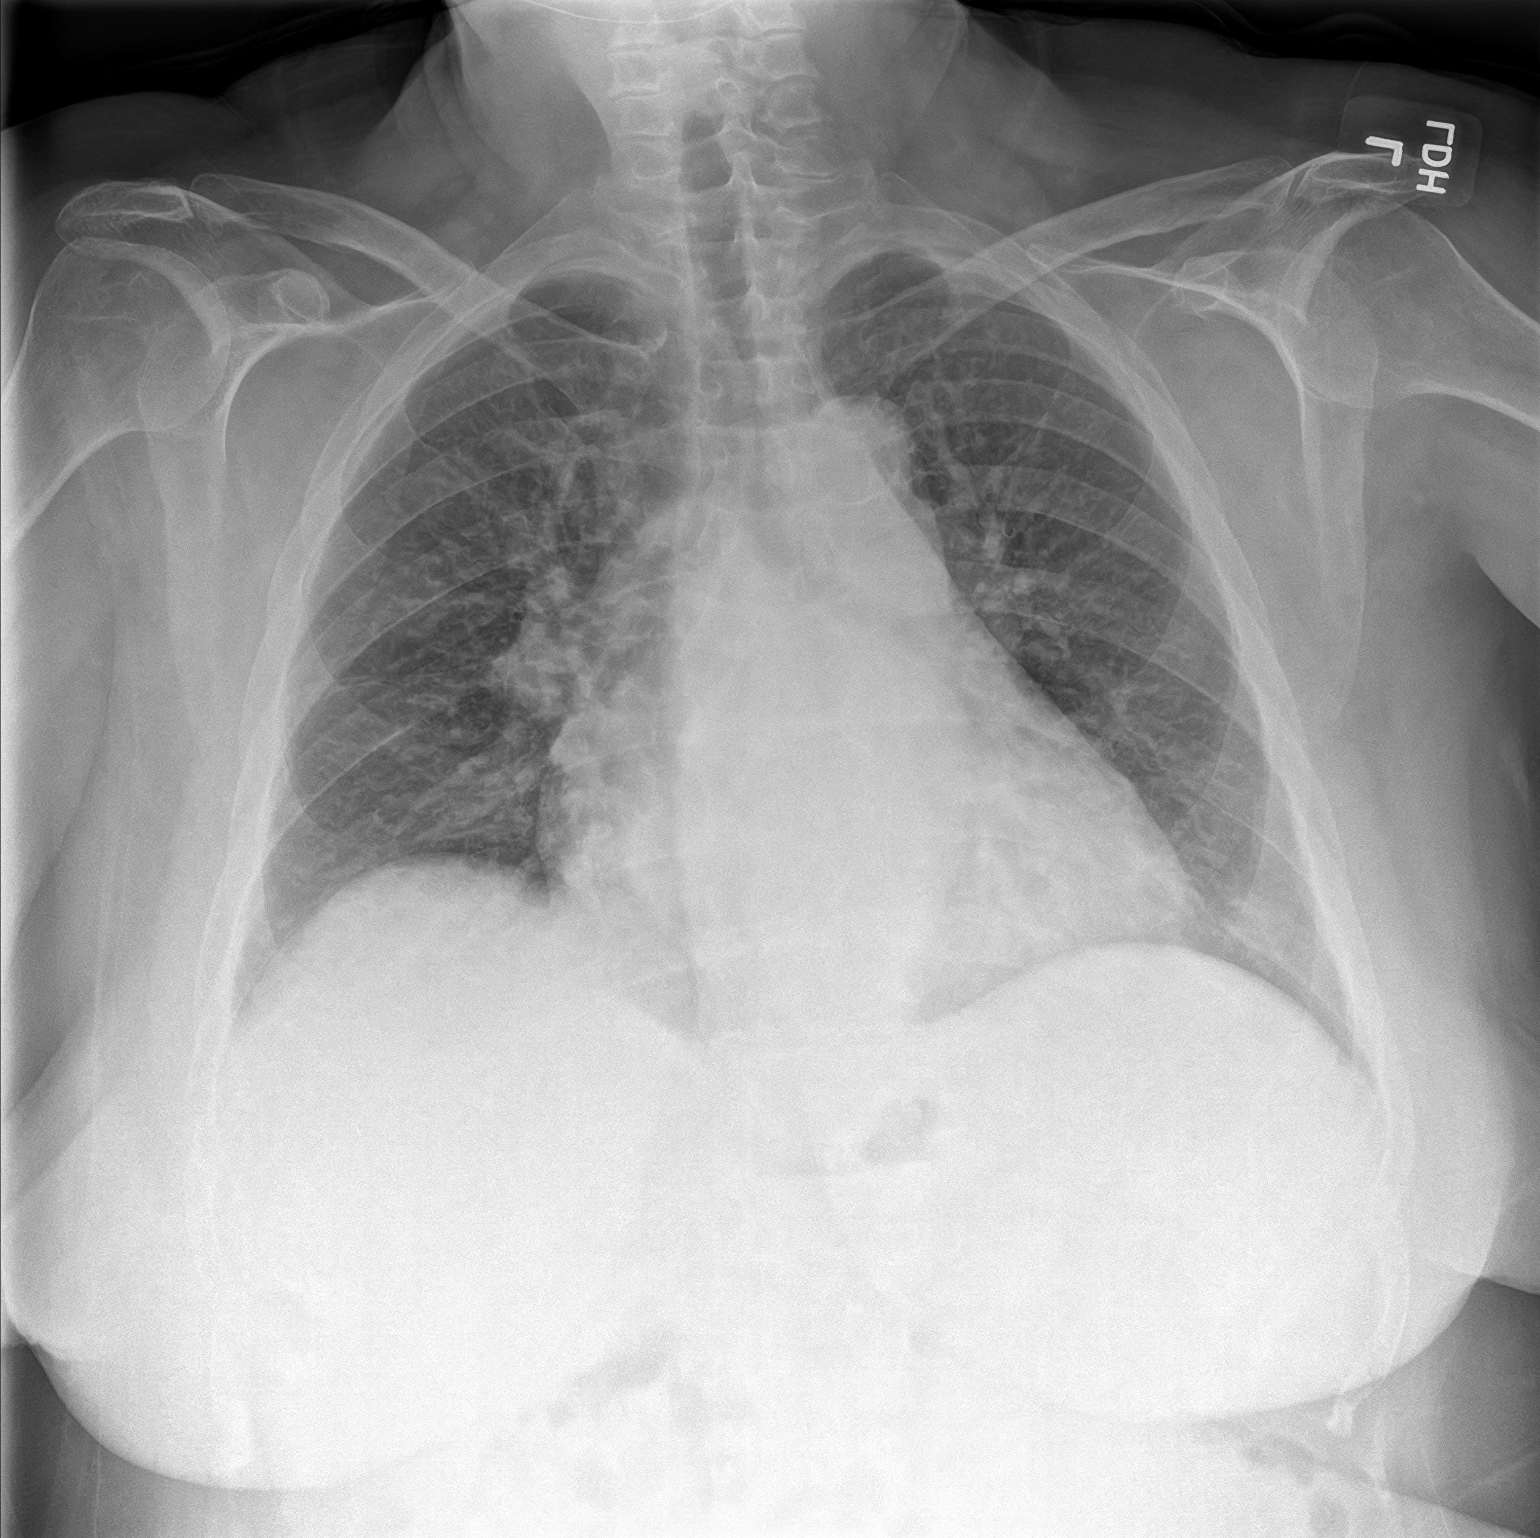
[im 2/2]
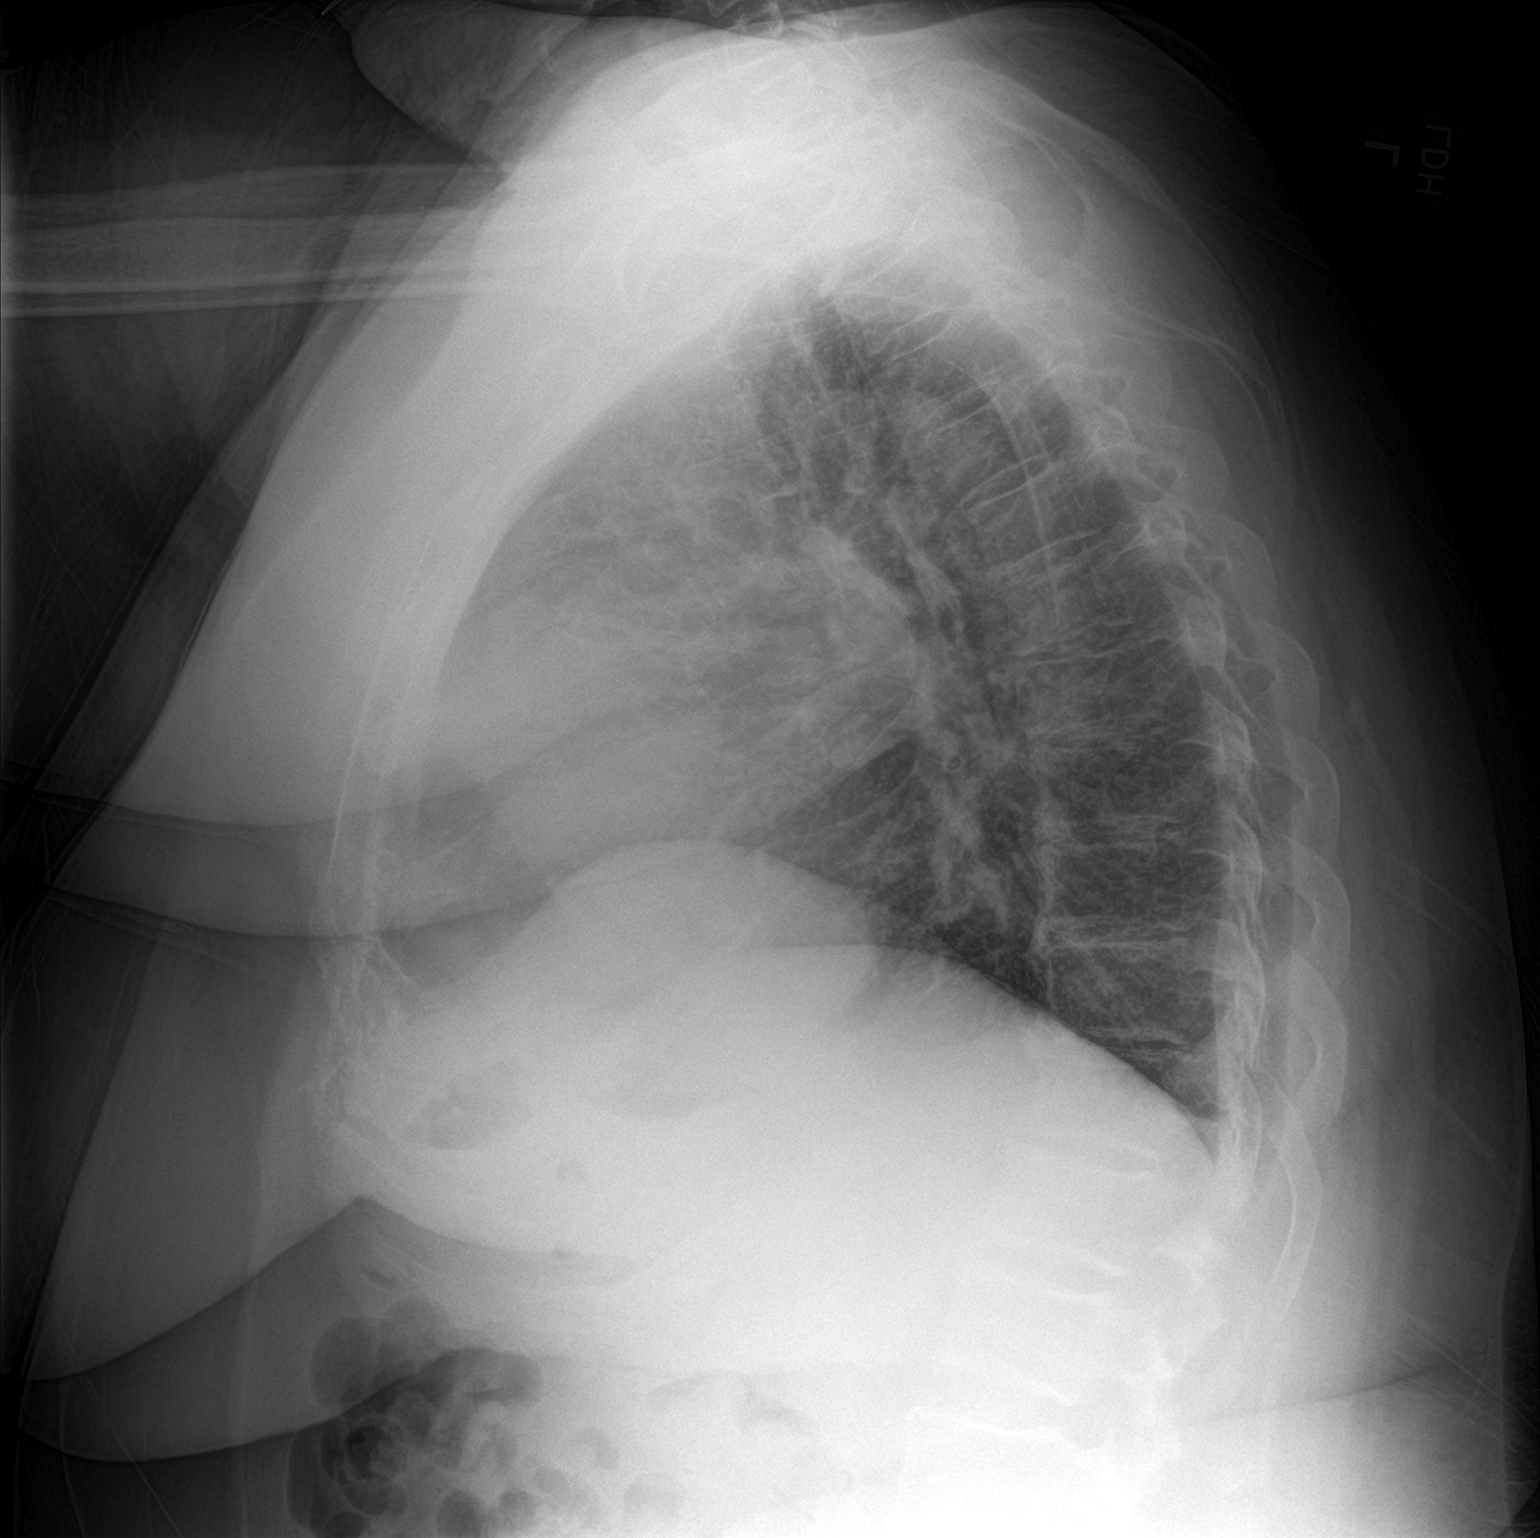

[2 of 2 positions shown; findings below may reference images not displayed]

FINDINGS: Transverse diameter of heart is increased. Central pulmonary vessels
are more prominent. There is prominence of interstitial markings in
the parahilar regions and lower lung fields. There is no focal
pulmonary consolidation. Lateral CP angles are clear. There is no
pneumothorax.
IMPRESSION: Cardiomegaly. Prominence of central pulmonary vessels suggests
possible mild CHF. There is prominence of interstitial markings in
the parahilar regions and lower lung fields on both sides which may
suggest mild interstitial edema or interstitial pneumonia or
underlying scarring. There is no focal pulmonary consolidation.

## 2022-09-17 ENCOUNTER — Other Ambulatory Visit: Payer: Self-pay | Admitting: Internal Medicine

## 2022-09-17 ENCOUNTER — Ambulatory Visit
Admission: RE | Admit: 2022-09-17 | Discharge: 2022-09-17 | Disposition: A | Discharge status: Home / Self Care | Payer: Medicare HMO | Source: Ambulatory Visit | Attending: Internal Medicine | Admitting: Internal Medicine

## 2022-09-17 DIAGNOSIS — M79604 Pain in right leg: Secondary | ICD-10-CM

## 2022-09-17 DIAGNOSIS — M25561 Pain in right knee: Secondary | ICD-10-CM

## 2023-01-01 ENCOUNTER — Other Ambulatory Visit: Payer: Self-pay | Admitting: Nurse Practitioner

## 2023-01-02 NOTE — Telephone Encounter (Signed)
Patient is overdue for an appointment. Please call to schedule then route to provider for refill.  

## 2023-01-02 NOTE — Telephone Encounter (Signed)
Requested medication (s) are due for refill today: yes  Requested medication (s) are on the active medication list: yes  Last refill:  12/22/21  Future visit scheduled: yes  Notes to clinic:  Unable to refill per protocol due to failed labs, no updated results.      Requested Prescriptions  Pending Prescriptions Disp Refills   ELIQUIS 5 MG TABS tablet [Pharmacy Med Name: ELIQUIS 5MG  TABLETS] 180 tablet 1    Sig: TAKE 1 TABLET BY MOUTH TWICE DAILY     Hematology:  Anticoagulants - apixaban Failed - 01/01/2023  5:11 PM      Failed - PLT in normal range and within 360 days    Platelets  Date Value Ref Range Status  10/10/2021 176 150 - 450 x10E3/uL Final         Failed - HGB in normal range and within 360 days    Hemoglobin  Date Value Ref Range Status  10/10/2021 13.1 11.1 - 15.9 g/dL Final         Failed - HCT in normal range and within 360 days    Hematocrit  Date Value Ref Range Status  10/10/2021 39.0 34.0 - 46.6 % Final         Failed - Cr in normal range and within 360 days    Creatinine, Ser  Date Value Ref Range Status  10/25/2021 0.92 0.57 - 1.00 mg/dL Final         Failed - AST in normal range and within 360 days    AST  Date Value Ref Range Status  10/25/2021 11 0 - 40 IU/L Final         Failed - ALT in normal range and within 360 days    ALT  Date Value Ref Range Status  10/25/2021 6 0 - 32 IU/L Final         Passed - Valid encounter within last 12 months    Recent Outpatient Visits           1 year ago Heart failure with preserved ejection fraction, unspecified HF chronicity (H[facility])   [facility] Larae Grooms, NP   1 year ago Heart failure with preserved ejection fraction, unspecified HF chronicity ([facility])   [facility] Larae Grooms, NP   1 year ago Atelectasis   [facility] Larae Grooms, NP   1 year ago Persistent atrial fibrillation [facility])   [place]  [facility] Larae Grooms, NP   1 year ago COVID-19   [place] [facility] Larae Grooms, NP       Future Appointments             In 3 months [facility]

## 2023-01-03 NOTE — Telephone Encounter (Signed)
FYI Called patient to get her scheduled for an appointment, she stated, "I don't go to [facility] anymore, I have been going to the [facility] on [address]."  Then she stated, "They have already been told because I got my records from there to send to them."

## 2023-03-19 ENCOUNTER — Telehealth: Payer: Self-pay

## 2023-03-19 NOTE — Telephone Encounter (Signed)
Gastroenterology Pre-Procedure Review  Last colonoscopy was with Dr. Maximino Greenland on 02/19/20 no polyps were noted.  No pathology was done since there were no polyps.  No family history of colon cancer.  Colonoscopy not due.   Called back to let patient know this. Request Date: TBD  Requesting Physician: Dr. Jodelle Gross  PATIENT REVIEW QUESTIONS: The patient responded to the following health history questions as indicated:    1. Are you having any GI issues? no 2. Do you have a personal history of Polyps? no 3. Do you have a family history of Colon Cancer or Polyps? no 4. Diabetes Mellitus? no 5. Joint replacements in the past 12 months?no 6. Major health problems in the past 3 months?no 7. Any artificial heart valves, MVP, or defibrillator? Atrial Fib , HFpEF has seen cardiologist in over a year     MEDICATIONS & ALLERGIES:    Patient reports the following regarding taking any anticoagulation/antiplatelet therapy:   Plavix, Coumadin, Eliquis, Xarelto, Lovenox, Pradaxa, Brilinta, or Effient? yes (Eliquis prescribed by Dr. Sherrin Daisy) Aspirin? no  Patient confirms/reports the following medications:  Current Outpatient Medications  Medication Sig Dispense Refill   Acetaminophen (TYLENOL 8 HOUR PO) Take by mouth as needed.     albuterol (VENTOLIN HFA) 108 (90 Base) MCG/ACT inhaler Inhale 1-2 puffs into the lungs every 6 (six) hours as needed for wheezing or shortness of breath. 18 g 1   apixaban (ELIQUIS) 5 MG TABS tablet Take 1 tablet (5 mg total) by mouth 2 (two) times daily. 180 tablet 1   gabapentin (NEURONTIN) 600 MG tablet Take 1 tablet (600 mg total) by mouth 3 (three) times daily as needed. 270 tablet 1   metoprolol succinate (TOPROL-XL) 100 MG 24 hr tablet Take 1 tablet (100 mg total) by mouth in the morning and at bedtime. Take with or immediately following a meal. 180 tablet 1   ondansetron (ZOFRAN) 4 MG tablet Take 1 tablet (4 mg total) by mouth every 8 (eight) hours as needed for nausea  or vomiting. 20 tablet 0   Spacer/Aero-Holding Chambers (OPTICHAMBER DIAMOND) MISC by Does not apply route.     torsemide (DEMADEX) 20 MG tablet TAKE 1 TABLET BY MOUTH EVERY DAY (Patient not taking: Reported on 04/12/2022) 90 tablet 0   No current facility-administered medications for this visit.    Patient confirms/reports the following allergies:  No Known Allergies  No orders of the defined types were placed in this encounter.   AUTHORIZATION INFORMATION Primary Insurance: 1D#: Group #:  Secondary Insurance: 1D#: Group #:  SCHEDULE INFORMATION: Date: NOT DUE Time: Location:

## 2023-04-16 ENCOUNTER — Ambulatory Visit: Payer: Medicare HMO

## 2024-02-05 ENCOUNTER — Encounter: Payer: Self-pay | Admitting: Internal Medicine

## 2024-02-05 ENCOUNTER — Other Ambulatory Visit: Payer: Self-pay | Admitting: Internal Medicine

## 2024-02-05 DIAGNOSIS — M79604 Pain in right leg: Secondary | ICD-10-CM

## 2024-02-05 DIAGNOSIS — M7989 Other specified soft tissue disorders: Secondary | ICD-10-CM

## 2024-02-06 ENCOUNTER — Ambulatory Visit
Admission: RE | Admit: 2024-02-06 | Discharge: 2024-02-06 | Disposition: A | Discharge status: Home / Self Care | Source: Ambulatory Visit | Attending: Internal Medicine | Admitting: Internal Medicine

## 2024-02-06 DIAGNOSIS — M79604 Pain in right leg: Secondary | ICD-10-CM | POA: Diagnosis present

## 2024-02-06 DIAGNOSIS — M7989 Other specified soft tissue disorders: Secondary | ICD-10-CM | POA: Insufficient documentation

## 2024-03-17 ENCOUNTER — Ambulatory Visit: Admitting: Podiatry

## 2024-03-24 ENCOUNTER — Ambulatory Visit: Admitting: Podiatry

## 2024-04-29 ENCOUNTER — Ambulatory Visit
Admission: RE | Admit: 2024-04-29 | Discharge: 2024-04-29 | Disposition: A | Discharge status: Home / Self Care | Source: Ambulatory Visit | Attending: Internal Medicine | Admitting: Internal Medicine

## 2024-04-29 ENCOUNTER — Other Ambulatory Visit: Payer: Self-pay | Admitting: Internal Medicine

## 2024-04-29 ENCOUNTER — Ambulatory Visit
Admission: RE | Admit: 2024-04-29 | Discharge: 2024-04-29 | Disposition: A | Discharge status: Home / Self Care | Attending: Internal Medicine | Admitting: Internal Medicine

## 2024-04-29 DIAGNOSIS — M25571 Pain in right ankle and joints of right foot: Secondary | ICD-10-CM | POA: Insufficient documentation

## 2024-04-29 DIAGNOSIS — R52 Pain, unspecified: Secondary | ICD-10-CM | POA: Insufficient documentation

## 2024-06-15 ENCOUNTER — Inpatient Hospital Stay
Admission: EM | Admit: 2024-06-15 | Discharge: 2024-06-29 | DRG: 368 | Disposition: A | Attending: Internal Medicine | Admitting: Internal Medicine

## 2024-06-15 ENCOUNTER — Emergency Department

## 2024-06-15 ENCOUNTER — Other Ambulatory Visit: Payer: Self-pay

## 2024-06-15 ENCOUNTER — Inpatient Hospital Stay

## 2024-06-15 ENCOUNTER — Encounter: Payer: Self-pay | Admitting: *Deleted

## 2024-06-15 DIAGNOSIS — K226 Gastro-esophageal laceration-hemorrhage syndrome: Secondary | ICD-10-CM

## 2024-06-15 DIAGNOSIS — Z6841 Body Mass Index (BMI) 40.0 and over, adult: Secondary | ICD-10-CM | POA: Diagnosis not present

## 2024-06-15 DIAGNOSIS — K920 Hematemesis: Secondary | ICD-10-CM | POA: Diagnosis not present

## 2024-06-15 DIAGNOSIS — Z23 Encounter for immunization: Secondary | ICD-10-CM

## 2024-06-15 DIAGNOSIS — I2489 Other forms of acute ischemic heart disease: Secondary | ICD-10-CM | POA: Diagnosis present

## 2024-06-15 DIAGNOSIS — J9602 Acute respiratory failure with hypercapnia: Secondary | ICD-10-CM | POA: Diagnosis not present

## 2024-06-15 DIAGNOSIS — R0902 Hypoxemia: Secondary | ICD-10-CM

## 2024-06-15 DIAGNOSIS — J690 Pneumonitis due to inhalation of food and vomit: Principal | ICD-10-CM

## 2024-06-15 DIAGNOSIS — I4819 Other persistent atrial fibrillation: Secondary | ICD-10-CM | POA: Diagnosis present

## 2024-06-15 DIAGNOSIS — N179 Acute kidney failure, unspecified: Secondary | ICD-10-CM | POA: Diagnosis not present

## 2024-06-15 DIAGNOSIS — I4811 Longstanding persistent atrial fibrillation: Secondary | ICD-10-CM | POA: Diagnosis not present

## 2024-06-15 DIAGNOSIS — I4581 Long QT syndrome: Secondary | ICD-10-CM | POA: Diagnosis not present

## 2024-06-15 DIAGNOSIS — I5033 Acute on chronic diastolic (congestive) heart failure: Secondary | ICD-10-CM | POA: Diagnosis not present

## 2024-06-15 DIAGNOSIS — R7989 Other specified abnormal findings of blood chemistry: Secondary | ICD-10-CM | POA: Diagnosis not present

## 2024-06-15 DIAGNOSIS — I071 Rheumatic tricuspid insufficiency: Secondary | ICD-10-CM | POA: Diagnosis present

## 2024-06-15 DIAGNOSIS — J9601 Acute respiratory failure with hypoxia: Secondary | ICD-10-CM | POA: Diagnosis present

## 2024-06-15 DIAGNOSIS — I110 Hypertensive heart disease with heart failure: Secondary | ICD-10-CM | POA: Diagnosis present

## 2024-06-15 DIAGNOSIS — R6521 Severe sepsis with septic shock: Secondary | ICD-10-CM | POA: Diagnosis not present

## 2024-06-15 DIAGNOSIS — K922 Gastrointestinal hemorrhage, unspecified: Secondary | ICD-10-CM | POA: Diagnosis present

## 2024-06-15 DIAGNOSIS — Z7901 Long term (current) use of anticoagulants: Secondary | ICD-10-CM | POA: Diagnosis not present

## 2024-06-15 DIAGNOSIS — J189 Pneumonia, unspecified organism: Secondary | ICD-10-CM | POA: Diagnosis present

## 2024-06-15 DIAGNOSIS — N170 Acute kidney failure with tubular necrosis: Secondary | ICD-10-CM | POA: Diagnosis not present

## 2024-06-15 DIAGNOSIS — G9341 Metabolic encephalopathy: Secondary | ICD-10-CM | POA: Diagnosis not present

## 2024-06-15 DIAGNOSIS — Z5321 Procedure and treatment not carried out due to patient leaving prior to being seen by health care provider: Secondary | ICD-10-CM | POA: Diagnosis not present

## 2024-06-15 DIAGNOSIS — M79604 Pain in right leg: Secondary | ICD-10-CM | POA: Diagnosis present

## 2024-06-15 DIAGNOSIS — E66813 Obesity, class 3: Secondary | ICD-10-CM | POA: Diagnosis present

## 2024-06-15 DIAGNOSIS — D62 Acute posthemorrhagic anemia: Secondary | ICD-10-CM | POA: Diagnosis not present

## 2024-06-15 DIAGNOSIS — A419 Sepsis, unspecified organism: Secondary | ICD-10-CM | POA: Insufficient documentation

## 2024-06-15 DIAGNOSIS — K2971 Gastritis, unspecified, with bleeding: Secondary | ICD-10-CM | POA: Diagnosis not present

## 2024-06-15 DIAGNOSIS — I509 Heart failure, unspecified: Secondary | ICD-10-CM | POA: Diagnosis not present

## 2024-06-15 DIAGNOSIS — I272 Pulmonary hypertension, unspecified: Secondary | ICD-10-CM | POA: Diagnosis present

## 2024-06-15 DIAGNOSIS — I480 Paroxysmal atrial fibrillation: Secondary | ICD-10-CM | POA: Diagnosis not present

## 2024-06-15 DIAGNOSIS — I4891 Unspecified atrial fibrillation: Secondary | ICD-10-CM | POA: Diagnosis not present

## 2024-06-15 DIAGNOSIS — E870 Hyperosmolality and hypernatremia: Secondary | ICD-10-CM | POA: Diagnosis present

## 2024-06-15 DIAGNOSIS — Z1152 Encounter for screening for COVID-19: Secondary | ICD-10-CM | POA: Diagnosis not present

## 2024-06-15 DIAGNOSIS — R579 Shock, unspecified: Secondary | ICD-10-CM | POA: Diagnosis not present

## 2024-06-15 DIAGNOSIS — E876 Hypokalemia: Secondary | ICD-10-CM | POA: Diagnosis present

## 2024-06-15 DIAGNOSIS — D696 Thrombocytopenia, unspecified: Secondary | ICD-10-CM | POA: Diagnosis present

## 2024-06-15 DIAGNOSIS — E873 Alkalosis: Secondary | ICD-10-CM | POA: Diagnosis present

## 2024-06-15 LAB — URINALYSIS, W/ REFLEX TO CULTURE (INFECTION SUSPECTED)
Bacteria, UA: NONE SEEN
Bilirubin Urine: NEGATIVE
Glucose, UA: NEGATIVE mg/dL
Hgb urine dipstick: NEGATIVE
Ketones, ur: NEGATIVE mg/dL
Leukocytes,Ua: NEGATIVE
Nitrite: NEGATIVE
Protein, ur: NEGATIVE mg/dL
Specific Gravity, Urine: 1.013 (ref 1.005–1.030)
Squamous Epithelial / HPF: 0 /HPF (ref 0–5)
pH: 6 (ref 5.0–8.0)

## 2024-06-15 LAB — COMPREHENSIVE METABOLIC PANEL WITH GFR
ALT: 6 U/L (ref 0–44)
AST: 23 U/L (ref 15–41)
Albumin: 4.4 g/dL (ref 3.5–5.0)
Alkaline Phosphatase: 76 U/L (ref 38–126)
Anion gap: 15 (ref 5–15)
BUN: 13 mg/dL (ref 8–23)
CO2: 24 mmol/L (ref 22–32)
Calcium: 9.3 mg/dL (ref 8.9–10.3)
Chloride: 99 mmol/L (ref 98–111)
Creatinine, Ser: 0.9 mg/dL (ref 0.44–1.00)
GFR, Estimated: >60 mL/min (ref >60)
Glucose, Bld: 109 mg/dL — ABNORMAL HIGH (ref 70–99)
Potassium: 4.2 mmol/L (ref 3.5–5.1)
Sodium: 138 mmol/L (ref 135–145)
Total Bilirubin: 1.4 mg/dL — ABNORMAL HIGH (ref 0.0–1.2)
Total Protein: 7.1 g/dL (ref 6.5–8.1)

## 2024-06-15 LAB — URINALYSIS, COMPLETE (UACMP) WITH MICROSCOPIC
Bilirubin Urine: NEGATIVE
Glucose, UA: NEGATIVE mg/dL
Ketones, ur: NEGATIVE mg/dL
Leukocytes,Ua: NEGATIVE
Nitrite: NEGATIVE
Protein, ur: NEGATIVE mg/dL
Specific Gravity, Urine: 1.013 (ref 1.005–1.030)
Squamous Epithelial / HPF: 0 /HPF (ref 0–5)
pH: 6 (ref 5.0–8.0)

## 2024-06-15 LAB — CBC
HCT: 37.2 % (ref 36.0–46.0)
Hemoglobin: 12.2 g/dL (ref 12.0–15.0)
MCH: 31.2 pg (ref 26.0–34.0)
MCHC: 32.8 g/dL (ref 30.0–36.0)
MCV: 95.1 fL (ref 80.0–100.0)
Platelets: 87 K/uL — ABNORMAL LOW (ref 150–400)
RBC: 3.91 MIL/uL (ref 3.87–5.11)
RDW: 13.5 % (ref 11.5–15.5)
WBC: 5.3 K/uL (ref 4.0–10.5)
nRBC: 0.4 % — ABNORMAL HIGH (ref 0.0–0.2)

## 2024-06-15 LAB — BLOOD GAS, ARTERIAL
Acid-Base Excess: 5.5 mmol/L — ABNORMAL HIGH (ref 0.0–2.0)
Bicarbonate: 31.1 mmol/L — ABNORMAL HIGH (ref 20.0–28.0)
FIO2: 60 %
MECHVT: 420 mL
Mechanical Rate: 16
O2 Saturation: 99.9 %
PEEP: 5 cmH2O
Patient temperature: 37
pCO2 arterial: 48 mmHg (ref 32–48)
pH, Arterial: 7.42 (ref 7.35–7.45)
pO2, Arterial: 124 mmHg — ABNORMAL HIGH (ref 83–108)

## 2024-06-15 LAB — TYPE AND SCREEN
ABO/RH(D): O NEG
Antibody Screen: NEGATIVE

## 2024-06-15 LAB — TROPONIN T, HIGH SENSITIVITY
Troponin T High Sensitivity: 23 ng/L — ABNORMAL HIGH (ref 0–19)
Troponin T High Sensitivity: 39 ng/L — ABNORMAL HIGH (ref 0–19)

## 2024-06-15 LAB — GLUCOSE, CAPILLARY
Glucose-Capillary: 135 mg/dL — ABNORMAL HIGH (ref 70–99)
Glucose-Capillary: 67 mg/dL — ABNORMAL LOW (ref 70–99)

## 2024-06-15 LAB — LIPASE, BLOOD: Lipase: 18 U/L (ref 11–51)

## 2024-06-15 LAB — LACTIC ACID, PLASMA: Lactic Acid, Venous: 1.5 mmol/L (ref 0.5–1.9)

## 2024-06-15 MED ORDER — ACETAMINOPHEN 650 MG RE SUPP: 650.0000 mg | RECTAL | Status: DC | PRN

## 2024-06-15 MED ORDER — DEXTROSE 50 % IV SOLN: 25.0000 mL | Freq: Once | INTRAVENOUS | Status: AC

## 2024-06-15 MED ORDER — FENTANYL CITRATE (PF) 50 MCG/ML IJ SOSY
50.0000 ug | PREFILLED_SYRINGE | INTRAMUSCULAR | Status: AC | PRN
  Administered 2024-06-15 – 2024-06-16 (×3): 50 ug via INTRAVENOUS
  Filled 2024-06-15: qty 1

## 2024-06-15 MED ORDER — DOCUSATE SODIUM 100 MG PO CAPS: 100.0000 mg | ORAL_CAPSULE | Freq: Two times a day (BID) | ORAL | Status: DC | PRN

## 2024-06-15 MED ORDER — DOCUSATE SODIUM 50 MG/5ML PO LIQD: 100.0000 mg | Freq: Two times a day (BID) | ORAL | Status: DC

## 2024-06-15 MED ORDER — ORAL CARE MOUTH RINSE
15.0000 mL | OROMUCOSAL | Status: DC | PRN
Start: 2024-06-15 — End: 2024-06-18

## 2024-06-15 MED ORDER — ONDANSETRON HCL 4 MG/2ML IJ SOLN
4.0000 mg | Freq: Once | INTRAMUSCULAR | Status: AC
  Administered 2024-06-15: 4 mg via INTRAVENOUS
  Filled 2024-06-15: qty 2

## 2024-06-15 MED ORDER — ORAL CARE MOUTH RINSE
15.0000 mL | OROMUCOSAL | Status: DC
  Administered 2024-06-16 – 2024-06-20 (×53): 15 mL via OROMUCOSAL
  Filled 2024-06-15 (×5): qty 15

## 2024-06-15 MED ORDER — MIDAZOLAM HCL (PF) 2 MG/2ML IJ SOLN: 2.0000 mg | Freq: Once | INTRAMUSCULAR | Status: AC

## 2024-06-15 MED ORDER — CHLORHEXIDINE GLUCONATE CLOTH 2 % EX PADS
6.0000 | MEDICATED_PAD | Freq: Every day | CUTANEOUS | Status: DC
  Administered 2024-06-16 – 2024-06-29 (×14): 6 via TOPICAL

## 2024-06-15 MED ORDER — POLYETHYLENE GLYCOL 3350 17 G PO PACK: 17.0000 g | PACK | Freq: Every day | ORAL | Status: DC | PRN

## 2024-06-15 MED ORDER — METRONIDAZOLE 500 MG/100ML IV SOLN
500.0000 mg | Freq: Once | INTRAVENOUS | Status: AC
  Administered 2024-06-16: 500 mg via INTRAVENOUS
  Filled 2024-06-15: qty 100

## 2024-06-15 MED ORDER — IOHEXOL 350 MG/ML SOLN
125.0000 mL | Freq: Once | INTRAVENOUS | Status: AC | PRN
  Administered 2024-06-15: 125 mL via INTRAVENOUS

## 2024-06-15 MED ORDER — ETOMIDATE 2 MG/ML IV SOLN
25.0000 mg | Freq: Once | INTRAVENOUS | Status: AC
  Administered 2024-06-15: 25 mg via INTRAVENOUS
  Filled 2024-06-15: qty 20

## 2024-06-15 MED ORDER — SODIUM CHLORIDE 0.9 % IV BOLUS
1000.0000 mL | Freq: Once | INTRAVENOUS | Status: AC
  Administered 2024-06-15: 1000 mL via INTRAVENOUS

## 2024-06-15 MED ORDER — MIDAZOLAM HCL (PF) 2 MG/2ML IJ SOLN
1.0000 mg | INTRAMUSCULAR | Status: DC | PRN
  Administered 2024-06-16: 1 mg via INTRAVENOUS
  Administered 2024-06-17 (×2): 2 mg via INTRAVENOUS
  Filled 2024-06-15 (×3): qty 2

## 2024-06-15 MED ORDER — PROPOFOL 1000 MG/100ML IV EMUL
0.0000 ug/kg/min | INTRAVENOUS | Status: DC
  Administered 2024-06-15: 20 ug/kg/min via INTRAVENOUS
  Administered 2024-06-16: 35 ug/kg/min via INTRAVENOUS
  Administered 2024-06-16: 45 ug/kg/min via INTRAVENOUS
  Administered 2024-06-16: 40 ug/kg/min via INTRAVENOUS
  Administered 2024-06-16: 50 ug/kg/min via INTRAVENOUS
  Administered 2024-06-16: 35 ug/kg/min via INTRAVENOUS
  Administered 2024-06-16: 40 ug/kg/min via INTRAVENOUS
  Administered 2024-06-16: 35 ug/kg/min via INTRAVENOUS
  Administered 2024-06-17: 40 ug/kg/min via INTRAVENOUS
  Administered 2024-06-17: 30 ug/kg/min via INTRAVENOUS
  Administered 2024-06-17 (×2): 40 ug/kg/min via INTRAVENOUS
  Administered 2024-06-17: 35 ug/kg/min via INTRAVENOUS
  Administered 2024-06-17: 40 ug/kg/min via INTRAVENOUS
  Administered 2024-06-18 (×2): 35 ug/kg/min via INTRAVENOUS
  Filled 2024-06-15 (×18): qty 100

## 2024-06-15 MED ORDER — SUCCINYLCHOLINE CHLORIDE 200 MG/10ML IV SOSY
120.0000 mg | PREFILLED_SYRINGE | Freq: Once | INTRAVENOUS | Status: AC
  Administered 2024-06-15: 120 mg via INTRAVENOUS
  Filled 2024-06-15: qty 10

## 2024-06-15 MED ORDER — SODIUM CHLORIDE 0.9 % IV SOLN
250.0000 mg | Freq: Once | INTRAVENOUS | Status: AC
  Administered 2024-06-16: 250 mg via INTRAVENOUS
  Filled 2024-06-15: qty 5

## 2024-06-15 MED ORDER — SODIUM CHLORIDE 0.9 % IV SOLN
2.0000 g | Freq: Once | INTRAVENOUS | Status: AC
  Administered 2024-06-16: 2 g via INTRAVENOUS
  Filled 2024-06-15 (×3): qty 12.5

## 2024-06-15 MED ORDER — DEXTROSE 50 % IV SOLN
INTRAVENOUS | Status: AC
  Administered 2024-06-15: 25 mL via INTRAVENOUS
  Filled 2024-06-15: qty 50

## 2024-06-15 MED ORDER — POLYETHYLENE GLYCOL 3350 17 G PO PACK: 17.0000 g | PACK | Freq: Every day | ORAL | Status: DC

## 2024-06-15 MED ORDER — MORPHINE SULFATE (PF) 4 MG/ML IV SOLN
4.0000 mg | Freq: Once | INTRAVENOUS | Status: AC
  Administered 2024-06-15: 4 mg via INTRAVENOUS
  Filled 2024-06-15: qty 1

## 2024-06-15 MED ORDER — MIDAZOLAM-SODIUM CHLORIDE 100-0.9 MG/100ML-% IV SOLN
2.0000 mg/h | INTRAVENOUS | Status: DC
  Administered 2024-06-15: 2 mg/h via INTRAVENOUS
  Filled 2024-06-15: qty 100

## 2024-06-15 MED ORDER — PANTOPRAZOLE SODIUM 40 MG IV SOLR
40.0000 mg | Freq: Two times a day (BID) | INTRAVENOUS | Status: DC
  Administered 2024-06-16 – 2024-06-27 (×22): 40 mg via INTRAVENOUS
  Filled 2024-06-15 (×23): qty 10

## 2024-06-15 MED ORDER — FENTANYL CITRATE (PF) 50 MCG/ML IJ SOSY
50.0000 ug | PREFILLED_SYRINGE | INTRAMUSCULAR | Status: DC | PRN
  Administered 2024-06-16 (×2): 50 ug via INTRAVENOUS
  Administered 2024-06-17 (×2): 100 ug via INTRAVENOUS
  Administered 2024-06-18: 50 ug via INTRAVENOUS
  Filled 2024-06-15: qty 1
  Filled 2024-06-15 (×2): qty 2
  Filled 2024-06-15: qty 1
  Filled 2024-06-15: qty 2
  Filled 2024-06-15: qty 1
  Filled 2024-06-15: qty 2

## 2024-06-15 MED ORDER — MIDAZOLAM HCL 2 MG/2ML IJ SOLN
INTRAMUSCULAR | Status: AC
  Administered 2024-06-15: 2 mg via INTRAVENOUS
  Filled 2024-06-15: qty 2

## 2024-06-15 MED ORDER — PANTOPRAZOLE SODIUM 40 MG IV SOLR
40.0000 mg | INTRAVENOUS | Status: AC
  Filled 2024-06-15: qty 10

## 2024-06-15 MED ORDER — VANCOMYCIN HCL IN DEXTROSE 1-5 GM/200ML-% IV SOLN: 1000.0000 mg | Freq: Once | INTRAVENOUS | Status: DC

## 2024-06-15 NOTE — ED Provider Notes (Signed)
 [facility] Provider Note    Event Date/Time   First MD Initiated Contact with Patient 06/15/24 2006     (approximate)  History   Chief Complaint: Hematemesis  HPI  [name] is a 73 y.o. female with a past medical history of migraines, atrial fibrillation (stopped Eliquis  last year) presents to the emergency department for nausea vomiting, trouble breathing cough and bloody vomitus.  According to family for the last 3 days the patient has been describing upper abdominal pain has been nauseated.  They state the patient this morning began vomiting with bloody vomitus.  Upon arrival to the emergency department patient had just vomited a moderate amount of bloody vomitus per nurse.  Patient was rushed back to room 5 4 I evaluated.  Patient satting in the low 80s on 2 L, placed on a nonrebreather mask currently satting 91% on nonrebreather.  Patient is able to answer questions but takes multiple attempts.  She is somnolent.  I discussed with the patient intubation to secure her airway as she would likely need intubation prior to GI evaluation/endoscopy, also as she is satting 91% on 100% O2 nonrebreather I believe patient may need additional respiratory support.  Patient is answering questions but is very slow to answer questions you have to ask the questions multiple times.  Patient initially was reluctant to intubation however now the family has arrived patient is agreeable to intubation.  Physical Exam   Triage Vital Signs: ED Triage Vitals  Encounter Vitals Group     BP 06/15/24 1948 (!) 167/127     Girls Systolic BP Percentile --      Girls Diastolic BP Percentile --      Boys Systolic BP Percentile --      Boys Diastolic BP Percentile --      Pulse Rate 06/15/24 1948 (!) 115     Resp 06/15/24 1948 (!) 26     Temp 06/15/24 1948 98.2 F (36.8 C)     Temp src --      SpO2 06/15/24 1948 (!) 85 %     Weight 06/15/24 1944 282 lb (127.9 kg)     Height  06/15/24 1944 5' 3 (1.6 m)     Head Circumference --      Peak Flow --      Pain Score 06/15/24 1944 10     Pain Loc --      Pain Education --      Exclude from Growth Chart --     Most recent vital signs: Vitals:   06/15/24 1948  BP: (!) 167/127  Pulse: (!) 115  Resp: (!) 26  Temp: 98.2 F (36.8 C)  SpO2: (!) 85%    General: Patient is awake, somnolent moaning, difficult to get a history from the patient family provides most history.  Dried bloody appearing vomitus on face and chest. CV:  Good peripheral perfusion.  Irregular rhythm rate around 150 to 180 bpm Resp:  Tachypnea with moderate rhonchi bilaterally. Abd:  No distention.  Soft, nontender.  No rebound or guarding.  ED Results / Procedures / Treatments   EKG  EKG viewed and interpreted by myself shows atrial fibrillation with rapid ventricular sponsor 177 bpm with narrow QRS, normal axis, largely normal intervals with nonspecific ST changes.  RADIOLOGY  I have reviewed interpret the chest x-ray images.  Appears to have left-sided pneumonia/opacity.   MEDICATIONS ORDERED IN ED: Medications  sodium chloride  0.9 % bolus 1,000 mL (has  no administration in time range)  ondansetron  (ZOFRAN ) injection 4 mg (has no administration in time range)  morphine  (PF) 4 MG/ML injection 4 mg (has no administration in time range)     IMPRESSION / MDM / ASSESSMENT AND PLAN / ED COURSE  I reviewed the triage vital signs and the nursing notes.  Patient's presentation is most consistent with acute presentation with potential threat to life or bodily function.  Patient presents emergency department for apparently 3 days of upper abdominal discomfort nausea now with vomiting what appears to be bloody vomitus today.  Patient is also coughing has diffuse rhonchi and she is currently satting 91% on a nonrebreather mask.  Patient will answer questions but she is slow to respond.  Ultimately I discussed with the patient given her likely  upper GI bleed likely aspiration requiring 100% oxygen support recommended intubation.  Patient is agreeable after speaking to family.  We will proceed with intubation for airway protection as well as oxygenation issues, given the patient's current mental state.  Intubation successful without issue.  I spoke to Dr. Onita of GI medicine, he would like to continue to monitor the patient as she is now being stabilized and does not believe that she necessarily needs an emergent endoscopy.  Lab work is pending.  We will sedate with propofol  we will obtain her lab work patient will require ICU admission we will start the patient on IV Protonix .  Will have GI medicine consult.  GI medicine requested an update if the patient were to destabilize, which seems reasonable at this time.  Patient was previously on Eliquis  for her A-fib but states she stopped this last year.  We will also attempt to obtain a CT GI bleed protocol study as well as a CT scan of the chest to further evaluate.  Chest x-ray appears to show consolidation possible aspiration we will start on IV antibiotics.  CBC shows a reassuring H&H currently 12.2/37.  Lactic acid of 1.5, chemistry shows no significant finding.  We will obtain a CT scan of the chest and a CT angio of the abdomen to evaluate for bleed.  Patient admitted to the intensive care unit.  INTUBATION Performed by: Huxley Shurley  Required items: required blood products, implants, devices, and special equipment available Patient identity confirmed: provided demographic data and hospital-assigned identification number Time out: Immediately prior to procedure a time out was called to verify the correct patient, procedure, equipment, support staff and site/side marked as required.  Indications: Airway protection, respiratory failure  Intubation method: S4 Glidescope Laryngoscopy   Preoxygenation: 100% BVM  Sedatives: 25 mg etomidate  Paralytic: 120 mg  succinylcholine   Tube Size: 7.5 cuffed  Post-procedure assessment: chest rise and ETCO2 monitor Breath sounds: equal and absent over the epigastrium Tube secured with: ETT holder Chest x-ray interpreted by radiologist and me.  Chest x-ray findings: endotracheal tube in appropriate position  Patient tolerated the procedure well with no immediate complications.    CRITICAL CARE Performed by: Franky Moores   Total critical care time: 60 minutes  Critical care time was exclusive of separately billable procedures and treating other patients.  Critical care was necessary to treat or prevent imminent or life-threatening deterioration.  Critical care was time spent personally by me on the following activities: development of treatment plan with patient and/or surrogate as well as nursing, discussions with consultants, evaluation of patient's response to treatment, examination of patient, obtaining history from patient or surrogate, ordering and performing treatments and interventions, ordering and  review of laboratory studies, ordering and review of radiographic studies, pulse oximetry and re-evaluation of patient's condition.   FINAL CLINICAL IMPRESSION(S) / ED DIAGNOSES   GI bleed Aspiration/pneumonia Hypoxia  Rx / DC Orders   GI bleed Pneumonia Aspiration  Note:  This document was prepared using Dragon voice recognition software and may include unintentional dictation errors.   Dorothyann Drivers, MD 06/15/24 [phone]

## 2024-06-15 NOTE — ED Notes (Signed)
RT at bedside for intubation

## 2024-06-15 NOTE — ED Notes (Signed)
 Dr. Dorothyann at bedside with this RN, Zachary RN, and RT for intubation

## 2024-06-15 NOTE — Progress Notes (Addendum)
 Brief GI Call Note  Called by EM physician regarding pt with hematemesis A fib on eliquis , obesity, pulm htn, thrombocytopenia 98K, chronic anemia (hgb 11.3 mcv 96 in sept), cardiomyopathy Report is vomiting blood- had previously been vomiting over last few days, but unclear if blood was later development vs with initial episode. Suspect MWT if blood was later development  Recently on ibuprofen (04/24/24 pcp note) for ankle pain  Currently hypertensive SBP 160s, tachycardic in the 110s O2 sat in low to mid 80s despite HF Arthur.  No h/o liver disease per report Labs pending at time of discussion/note  Due to patient respiratory compromise, she is too unstable for sedation/endoscopy at this time. Current plan per ED physician is to intubate PPI 40 mg iv q12 h to be started  Will follow up labs. If hemodynamic instability occurs, consider stat CTA for bleed localization Keep NPO Provide one dose of erythromycin  for gastric clearance.  Monitor H&H.  Transfusion and resuscitation as per primary team Avoid frequent lab draws to prevent lab induced anemia Supportive care and antiemetics as per primary team Maintain two sites IV access Avoid nsaids Monitor for GIB. No need for FOBT  GI to follow. Formal consult in the morning unless patient condition deteriorates requiring sooner evaluation.  Elspeth EMERSON Jungling, DO [facility]  Addendum: pt's hgb 12.2, plt 87K- higher and stable from baseline respectively compared to last month  Esophagogastroduodenoscopy tentatively planned for tomorrow pending patient stability and eliquis  washout

## 2024-06-15 NOTE — Progress Notes (Signed)
 eLink Physician-Brief Progress Note Patient Name: [name] DOB: March 24, 1951 MRN: [id number]   Date of Service  06/15/2024  HPI/Events of Note  Patient admitted with GI bleeding, suspected aspiration pneumonia, and acute hypoxemic respiratory failure requiring intubation and mechanical ventilation.  eICU Interventions  New Patient Evaluation.        Cherina Dhillon U Kiwanna Spraker 06/15/2024, 11:51 PM

## 2024-06-15 NOTE — H&P (Signed)
 NAME:  [name], MRN:  [id number], DOB:  02-29-1951, LOS: 1 ADMISSION DATE:  06/15/2024, CONSULTATION DATE:  06/15/24 REFERRING MD:  Dr. Dorothyann, CHIEF COMPLAINT:  Hematemesis   History of Present Illness:  73 yo F presenting to [facility] ED from home for evaluation of hematemesis.  History obtained per chart review and family bedside report. Patient was in her normal state of health until 11/21. Family reported she started feeling poorly with a cough, they denied congestion, complaints of dyspnea or chest pain. Then starting on 11/24 the patient began feeling nauseous and having hematemesis. Family denied any fever/ chills or any other complaints. And no previous episodes of hematemesis or GI bleeding that family is aware of. Patient is documented as complaining of abdominal pain, but family denies this. Family reports patient was on Eliquis  due to Chronic A-fib but stopped a year ago due to financial constraints. She had a recent PCP appointment and was expecting to hear from someone to assist her in obtaining medication, but hadn't heard anything yet. Family also reports some non-compliance with medication, that she tries not to take medicine if she doesn't need it. They deny any tobacco use/ ETOH use or recreational drug use history.   ED course: Upon arrival patient had a moderate episode of hematemesis in the waiting room. Initial vitals reveal tachypnea/ tachycardia/ hypertensive and hypoxic. Patient alert but altered requiring NRB to maintain oxygen. Due to hypoxic respiratory failure decision made to intubate the patient and place her on mechanical ventilatory support. No additional episodes of hematemesis. GI on call will follow along, not a candidate for emergent EGD as patient appears to have stabilized. CTa GIB scan ordered. Medications given: etomidate , versed , succinylcholine , morphine , zofran , 40 mg protonix , 1 L NS bolus, cefepime / vancomycin / flagyl , propofol  drip  started Initial Vitals: 98.2, 26, 152, 167/127 & 85% on RA  Significant labs: (Labs/ Imaging personally reviewed) I, Jenita Ruth Rust-Chester, AGACNP-BC, personally viewed and interpreted this ECG. EKG Interpretation: Date: 06/15/24, EKG Time: 19:52, Rate: 177, Rhythm: A-fib, QRS Axis:  RAD, Intervals: a-fib, ST/T Wave abnormalities: none (challenging read due to artifact), Narrative Interpretation: A-fib RVR Chemistry: Na+: 138, K+: 4.2, BUN/Cr.: 13/ 0.90, Serum CO2/ AG: 24/15, T.Bili: 1.4 Hematology: WBC: 5.3, Hgb: 12.2,  Troponin: 23, BNP: pending, Lactic/ PCT: 1.5/ pending, COVID-19 & Influenza A/B: pending ABG: 7.42/ 48 / 124/ 31.1  CXR 06/15/24: Interval placement of an endotracheal tube with the tip measuring 3.1 cm above the carina and an enteric tube with the tip off the field of view but below the left hemidiaphragm.2. Airspace infiltrates in the right perihilar region and throughout the left lung, similar to the prior study, which may represent pneumonia, edema, or aspiration. 3. Cardiac enlargement. CT chest wo contrast 06/15/24: . Extensive bilateral airspace disease, most pronounced throughout the left lung and in the right lower lobe, concerning for pneumonia. 2. Trace bilateral pleural effusions. 3. Cardiomegaly, coronary artery disease. 4. Aortic atherosclerosis. CT angio GIB protocol 06/15/24:  No active GI bleeding. 2. No occlusion or hemodynamically significant stenosis of the abdominal or pelvic arterial system. No abdominal aortic aneurysm or dissection. 3. Presacral perirectal 6.5 x 6.2 cm fluid collection, minimally changed since 2012 and likely benign. 4. Moderate-sized umbilical hernia containing fat and a small amount of fluid, increased in size since 2012  PCCM consulted for admission due to acute hypoxic respiratory failure s/t suspected aspiration pneumonia in the setting of suspected acute upper GIB on mechanical ventilatory support.  Pertinent  Medical History   Mild Pulmonary HTN Thrombocytopenia HTN CAD Polyneuropathy PAF (previously on Eliquis - stopped last year due to financial constraints) Cardiomyopathy (echo: 07/2020: LVEF 60-65%, severe LA dilatation, mild RA dilatation, borderline pulm htn with PASP 35 mmHg)  Significant Hospital Events: Including procedures, antibiotic start and stop dates in addition to other pertinent events   06/15/24: Admit to ICU due to acute hypoxic respiratory failure s/t suspected aspiration pneumonia in the setting of suspected acute upper GIB on mechanical ventilatory support.  Interim History / Subjective:  Patient intubated and sedated at this time. Spoke with daughter and grand-daughters bedside, plan of care discussed- all questions and concerns answered at this time  Objective    Blood pressure (!) 105/58, pulse (!) 114, temperature (!) 100.6 F (38.1 C), resp. rate 16, height 5' 3 (1.6 m), weight 135.6 kg, SpO2 97%.    Vent Mode: PRVC FiO2 (%):  [40 %-60 %] 40 % Set Rate:  [16 bmp] 16 bmp Vt Set:  [420 mL] 420 mL PEEP:  [5 cmH20] 5 cmH20 Plateau Pressure:  [27 cmH20] 27 cmH20   Intake/Output Summary (Last 24 hours) at 06/16/2024 0134 Last data filed at 06/16/2024 0126 Gross per 24 hour  Intake 1293.97 ml  Output 310 ml  Net 983.97 ml   Filed Weights   06/15/24 1944 06/15/24 2340  Weight: 127.9 kg 135.6 kg    Examination: General: Adult female, critically ill, lying in bed intubated & sedated requiring mechanical ventilation, NAD HEENT: MM pink/moist, anicteric, atraumatic, neck supple Neuro: sedated, unable to follow commands, PERRL +3, MAE CV: s1s2 irregular, afib on monitor, no r/m/g Pulm: Regular, non labored on PRVC 60% & PEEP 8, breath sounds rhonchi-BUL & diminished-BLL GI: soft, rounded, bs x 4 GU: foley in place with clear yellow urine Skin:  no rashes/lesions noted Extremities: warm/dry, pulses + 2 R/P, trace edema noted BLE  Resolved problem list   Assessment and Plan   Suspected Acute Upper Gastrointestinal Bleed secondary to unclear etiology Chronic? Thrombocytopenia- unclear baseline platelets - continue Protonix  bid - Maintain up to date type & screen - continuous cardiac monitoring - NPO - Monitor for s/s of bleeding - Daily CBC, PT/ INR monitoring PRN - Transfuse for Hgb <7, platelets < 10 - GI consulted, appreciate input  Acute Hypoxic Respiratory Failure secondary to aspiration in the setting of refractory vomiting - Ventilator settings: PRVC  8 mL/kg, 60% FiO2, 5 PEEP, continue ventilator support & lung protective strategies - Wean PEEP & FiO2 as tolerated, maintain SpO2 > 90% - Head of bed elevated 30 degrees, VAP protocol in place - Plateau pressures less than 30 cm H20  - Intermittent chest x-ray & ABG PRN - Daily WUA with SBT as tolerated  - Ensure adequate pulmonary hygiene  - F/u cultures, trend PCT - Continue Aspiration Pna coverage: unasyn  - bronchodilators PRN - PAD protocol in place: continue Fentanyl  IVP & Propofol  drip  Chronic / Paroxysmal Atrial Fibrillation with Rapid Ventricular Response CHA2DS2-VASc score: 4 Improving post intubation, IVF bolus finishing. Patient has not been on any DOAC for a year. BP marginal with sedatives, not a good candidate for Diltiazem/ lopressor . - Consider Amiodarone  bolus/infusion if HR sustains > 130, conservative approach due to concern for medical conversion and risk for stroke  - f/u TSH & thyroid  panel, f/u BNP - Echocardiogram ordered - Consider Cardiology consultation depending on work up above - Continuous cardiac monitoring  Labs   CBC: Recent Labs  Lab 06/15/24 2005  WBC  5.3  HGB 12.2  HCT 37.2  MCV 95.1  PLT 87*    Basic Metabolic Panel: Recent Labs  Lab 06/15/24 2005  NA 138  K 4.2  CL 99  CO2 24  GLUCOSE 109*  BUN 13  CREATININE 0.90  CALCIUM 9.3   GFR: Estimated Creatinine Clearance: 76.4 mL/min (by C-G formula based on SCr of 0.9 mg/dL). Recent Labs   Lab 06/15/24 2005 06/15/24 2203  WBC 5.3  --   LATICACIDVEN  --  1.5    Liver Function Tests: Recent Labs  Lab 06/15/24 2005  AST 23  ALT 6  ALKPHOS 76  BILITOT 1.4*  PROT 7.1  ALBUMIN 4.4   Recent Labs  Lab 06/15/24 2005  LIPASE 18   No results for input(s): AMMONIA in the last 168 hours.  ABG    Component Value Date/Time   PHART 7.42 06/15/2024 2207   PCO2ART 48 06/15/2024 2207   PO2ART 124 (H) 06/15/2024 2207   HCO3 31.1 (H) 06/15/2024 2207   O2SAT 99.9 06/15/2024 2207     Coagulation Profile: No results for input(s): INR, PROTIME in the last 168 hours.  Cardiac Enzymes: No results for input(s): CKTOTAL, CKMB, CKMBINDEX, TROPONINI in the last 168 hours.  HbA1C: No results found for: HGBA1C  CBG: Recent Labs  Lab 06/15/24 2327 06/15/24 2355  GLUCAP 67* 135*    Review of Systems:   UTA- patient intubated and sedated  Past Medical History:  She,  has a past medical history of History of shingles, Knee pain, and Migraines.   Surgical History:   Past Surgical History:  Procedure Laterality Date   COLONOSCOPY WITH PROPOFOL  N/A 02/19/2020   Procedure: COLONOSCOPY WITH PROPOFOL ;  Surgeon: Janalyn Keene NOVAK, MD;  Location: [facility] ENDOSCOPY;  Service: Endoscopy;  Laterality: N/A;   REPLACEMENT TOTAL KNEE     left knee   TUBAL LIGATION       Social History:   reports that she has never smoked. She has never used smokeless tobacco. She reports that she does not drink alcohol and does not use drugs.   Family History:  Her family history includes Cancer in her brother and father; Heart disease in her mother; Hypertension in her mother.   Allergies No Known Allergies   Home Medications  Prior to Admission medications   Medication Sig Start Date End Date Taking? Authorizing Provider  Acetaminophen  (TYLENOL  8 HOUR PO) Take by mouth as needed.    [provider]  albuterol  (VENTOLIN  HFA) 108 (90 Base) MCG/ACT inhaler Inhale  1-2 puffs into the lungs every 6 (six) hours as needed for wheezing or shortness of breath. 01/25/21 01/25/22  Melvin Pao, NP  apixaban  (ELIQUIS ) 5 MG TABS tablet Take 1 tablet (5 mg total) by mouth 2 (two) times daily. 12/22/21   Melvin Pao, NP  gabapentin  (NEURONTIN ) 600 MG tablet Take 1 tablet (600 mg total) by mouth 3 (three) times daily as needed. 12/22/21   Melvin Pao, NP  metoprolol  succinate (TOPROL -XL) 100 MG 24 hr tablet Take 1 tablet (100 mg total) by mouth in the morning and at bedtime. Take with or immediately following a meal. 08/25/21 11/23/21  Melvin Pao, NP  ondansetron  (ZOFRAN ) 4 MG tablet Take 1 tablet (4 mg total) by mouth every 8 (eight) hours as needed for nausea or vomiting. 08/10/20   Rumball, Alison M, DO  Spacer/Aero-Holding [facility]) MISC by Does not apply route. 08/17/20   [provider]  torsemide  (DEMADEX ) 20 MG  tablet TAKE 1 TABLET BY MOUTH EVERY DAY Patient not taking: Reported on 04/12/2022 12/08/21   Cindie Ole DASEN, MD     Critical care time: 50 minutes       Jenita Jama Meek, AGACNP-BC Acute Care Nurse Practitioner [facility]   [phone] / [phone] Please see Amion for details.

## 2024-06-15 NOTE — Progress Notes (Signed)
 Transported patient to CT and ICU with no events.

## 2024-06-15 NOTE — ED Triage Notes (Addendum)
 Pt to triage via wheelchair.  Pt vomiting blood for 3 days.  Hx afib.  Pt has abd pain.  Pt has a cough.  EKG done in triage and pt taken to room 5.  Iv's started stat and pt placed on monitor.  Pt placed on 3 liters oxygen [place].  Md at bedside.

## 2024-06-16 ENCOUNTER — Inpatient Hospital Stay

## 2024-06-16 ENCOUNTER — Encounter: Payer: Self-pay | Admitting: Anesthesiology

## 2024-06-16 ENCOUNTER — Encounter: Admission: EM | Disposition: A | Payer: Self-pay | Source: Home / Self Care | Attending: Internal Medicine

## 2024-06-16 ENCOUNTER — Inpatient Hospital Stay
Admit: 2024-06-16 | Discharge: 2024-06-16 | Disposition: A | Discharge status: Home / Self Care | Attending: Pulmonary Disease | Admitting: Pulmonary Disease

## 2024-06-16 DIAGNOSIS — K226 Gastro-esophageal laceration-hemorrhage syndrome: Secondary | ICD-10-CM

## 2024-06-16 DIAGNOSIS — A419 Sepsis, unspecified organism: Secondary | ICD-10-CM | POA: Insufficient documentation

## 2024-06-16 DIAGNOSIS — K920 Hematemesis: Secondary | ICD-10-CM

## 2024-06-16 DIAGNOSIS — I4891 Unspecified atrial fibrillation: Secondary | ICD-10-CM

## 2024-06-16 DIAGNOSIS — I4581 Long QT syndrome: Secondary | ICD-10-CM

## 2024-06-16 HISTORY — PX: ESOPHAGOGASTRODUODENOSCOPY: SHX5428

## 2024-06-16 LAB — CBC WITH DIFFERENTIAL/PLATELET
Abs Immature Granulocytes: 1.22 K/uL — ABNORMAL HIGH (ref 0.00–0.07)
Basophils Absolute: 0 K/uL (ref 0.0–0.1)
Basophils Relative: 0 %
Eosinophils Absolute: 0.3 K/uL (ref 0.0–0.5)
Eosinophils Relative: 2 %
HCT: 34.3 % — ABNORMAL LOW (ref 36.0–46.0)
Hemoglobin: 11.2 g/dL — ABNORMAL LOW (ref 12.0–15.0)
Immature Granulocytes: 6 %
Lymphocytes Relative: 5 %
Lymphs Abs: 1.1 K/uL (ref 0.7–4.0)
MCH: 31 pg (ref 26.0–34.0)
MCHC: 32.7 g/dL (ref 30.0–36.0)
MCV: 95 fL (ref 80.0–100.0)
Monocytes Absolute: 3.1 K/uL — ABNORMAL HIGH (ref 0.1–1.0)
Monocytes Relative: 14 %
Neutro Abs: 16.5 K/uL — ABNORMAL HIGH (ref 1.7–7.7)
Neutrophils Relative %: 73 %
Platelets: 119 K/uL — ABNORMAL LOW (ref 150–400)
RBC: 3.61 MIL/uL — ABNORMAL LOW (ref 3.87–5.11)
RDW: 14 % (ref 11.5–15.5)
Smear Review: NORMAL
WBC: 22.2 K/uL — ABNORMAL HIGH (ref 4.0–10.5)
nRBC: 0 % (ref 0.0–0.2)

## 2024-06-16 LAB — BASIC METABOLIC PANEL WITH GFR
Anion gap: 12 (ref 5–15)
BUN: 16 mg/dL (ref 8–23)
CO2: 25 mmol/L (ref 22–32)
Calcium: 8.5 mg/dL — ABNORMAL LOW (ref 8.9–10.3)
Chloride: 100 mmol/L (ref 98–111)
Creatinine, Ser: 1.01 mg/dL — ABNORMAL HIGH (ref 0.44–1.00)
GFR, Estimated: 59 mL/min — ABNORMAL LOW (ref >60)
Glucose, Bld: 95 mg/dL (ref 70–99)
Potassium: 3.6 mmol/L (ref 3.5–5.1)
Sodium: 137 mmol/L (ref 135–145)

## 2024-06-16 LAB — ECHOCARDIOGRAM COMPLETE
AR max vel: 2.74 cm2
AV Area VTI: 2.82 cm2
AV Area mean vel: 2.76 cm2
AV Mean grad: 4 mmHg
AV Peak grad: 6.7 mmHg
Ao pk vel: 1.29 m/s
Area-P 1/2: 4.71 cm2
Calc EF: 57.8 %
Height: 63 in
MV VTI: 1.8 cm2
P 1/2 time: 202 ms
S' Lateral: 3.2 cm
Single Plane A2C EF: 46.6 %
Single Plane A4C EF: 67.7 %
Weight: 4881.87 [oz_av]

## 2024-06-16 LAB — BLOOD GAS, ARTERIAL
Acid-base deficit: 1.1 mmol/L (ref 0.0–2.0)
Bicarbonate: 24.3 mmol/L (ref 20.0–28.0)
FIO2: 40 %
MECHVT: 420 mL
Mechanical Rate: 16
O2 Saturation: 98.6 %
PEEP: 5 cmH2O
Patient temperature: 37
pCO2 arterial: 42 mmHg (ref 32–48)
pH, Arterial: 7.37 (ref 7.35–7.45)
pO2, Arterial: 90 mmHg (ref 83–108)

## 2024-06-16 LAB — GLUCOSE, CAPILLARY
Glucose-Capillary: 83 mg/dL (ref 70–99)
Glucose-Capillary: 80 mg/dL (ref 70–99)
Glucose-Capillary: 103 mg/dL — ABNORMAL HIGH (ref 70–99)
Glucose-Capillary: 110 mg/dL — ABNORMAL HIGH (ref 70–99)
Glucose-Capillary: 127 mg/dL — ABNORMAL HIGH (ref 70–99)
Glucose-Capillary: 120 mg/dL — ABNORMAL HIGH (ref 70–99)

## 2024-06-16 LAB — MAGNESIUM: Magnesium: 1.8 mg/dL (ref 1.7–2.4)

## 2024-06-16 LAB — RESP PANEL BY RT-PCR (RSV, FLU A&B, COVID)  RVPGX2
Influenza A by PCR: NEGATIVE
Influenza B by PCR: NEGATIVE
Resp Syncytial Virus by PCR: NEGATIVE
SARS Coronavirus 2 by RT PCR: NEGATIVE

## 2024-06-16 LAB — PRO BRAIN NATRIURETIC PEPTIDE: Pro Brain Natriuretic Peptide: 5289 pg/mL — ABNORMAL HIGH (ref <300.0)

## 2024-06-16 LAB — PATHOLOGIST SMEAR REVIEW

## 2024-06-16 LAB — PROTIME-INR
INR: 1.2 (ref 0.8–1.2)
Prothrombin Time: 15.4 s — ABNORMAL HIGH (ref 11.4–15.2)

## 2024-06-16 LAB — PROCALCITONIN: Procalcitonin: 18.2 ng/mL

## 2024-06-16 LAB — PHOSPHORUS: Phosphorus: 2.8 mg/dL (ref 2.5–4.6)

## 2024-06-16 LAB — STREP PNEUMONIAE URINARY ANTIGEN: Strep Pneumo Urinary Antigen: NEGATIVE

## 2024-06-16 LAB — TRIGLYCERIDES: Triglycerides: 70 mg/dL (ref <150)

## 2024-06-16 LAB — MRSA NEXT GEN BY PCR, NASAL: MRSA by PCR Next Gen: NOT DETECTED

## 2024-06-16 LAB — HEMOGLOBIN AND HEMATOCRIT, BLOOD
HCT: 35.9 % — ABNORMAL LOW (ref 36.0–46.0)
Hemoglobin: 11.2 g/dL — ABNORMAL LOW (ref 12.0–15.0)

## 2024-06-16 SURGERY — EGD (ESOPHAGOGASTRODUODENOSCOPY)
Anesthesia: General

## 2024-06-16 MED ORDER — AMIODARONE HCL IN DEXTROSE 360-4.14 MG/200ML-% IV SOLN
60.0000 mg/h | INTRAVENOUS | Status: DC
  Administered 2024-06-16: 60 mg/h via INTRAVENOUS
  Filled 2024-06-16: qty 200

## 2024-06-16 MED ORDER — NOREPINEPHRINE 4 MG/250ML-% IV SOLN
0.0000 ug/min | INTRAVENOUS | Status: DC
  Filled 2024-06-16: qty 250

## 2024-06-16 MED ORDER — PNEUMOCOCCAL 20-VAL CONJ VACC 0.5 ML IM SUSY: 0.5000 mL | PREFILLED_SYRINGE | INTRAMUSCULAR | Status: DC | PRN

## 2024-06-16 MED ORDER — SODIUM CHLORIDE 0.9% FLUSH: 10.0000 mL | INTRAVENOUS | Status: DC | PRN

## 2024-06-16 MED ORDER — VANCOMYCIN HCL 2000 MG/400ML IV SOLN
2000.0000 mg | Freq: Once | INTRAVENOUS | Status: AC
  Administered 2024-06-16: 2000 mg via INTRAVENOUS
  Filled 2024-06-16: qty 400

## 2024-06-16 MED ORDER — NOREPINEPHRINE 4 MG/250ML-% IV SOLN
INTRAVENOUS | Status: AC
  Administered 2024-06-16: 10 ug/min via INTRAVENOUS
  Filled 2024-06-16: qty 250

## 2024-06-16 MED ORDER — ACETAMINOPHEN 325 MG PO TABS: 650.0000 mg | ORAL_TABLET | ORAL | Status: DC | PRN

## 2024-06-16 MED ORDER — ACETAMINOPHEN 650 MG RE SUPP
650.0000 mg | RECTAL | Status: DC | PRN
  Administered 2024-06-16: 650 mg via RECTAL
  Filled 2024-06-16: qty 1

## 2024-06-16 MED ORDER — AMIODARONE LOAD VIA INFUSION
150.0000 mg | Freq: Once | INTRAVENOUS | Status: DC
  Filled 2024-06-16: qty 83.34

## 2024-06-16 MED ORDER — SODIUM CHLORIDE 0.9 % IV SOLN
3.0000 g | Freq: Four times a day (QID) | INTRAVENOUS | Status: AC
  Administered 2024-06-16 – 2024-06-22 (×27): 3 g via INTRAVENOUS
  Filled 2024-06-16 (×29): qty 8

## 2024-06-16 MED ORDER — ACETAMINOPHEN 10 MG/ML IV SOLN
1000.0000 mg | Freq: Once | INTRAVENOUS | Status: AC
  Administered 2024-06-16: 1000 mg via INTRAVENOUS
  Filled 2024-06-16: qty 100

## 2024-06-16 MED ORDER — HYDROCORTISONE SOD SUC (PF) 100 MG IJ SOLR
50.0000 mg | Freq: Four times a day (QID) | INTRAMUSCULAR | Status: DC
  Administered 2024-06-16 – 2024-06-18 (×9): 50 mg via INTRAVENOUS
  Filled 2024-06-16 (×9): qty 2

## 2024-06-16 MED ORDER — NOREPINEPHRINE 16 MG/250ML-% IV SOLN
0.0000 ug/min | INTRAVENOUS | Status: DC
  Administered 2024-06-16: 2 ug/min via INTRAVENOUS
  Administered 2024-06-17: 4 ug/min via INTRAVENOUS
  Filled 2024-06-16 (×2): qty 250

## 2024-06-16 MED ORDER — LACTATED RINGERS IV BOLUS
500.0000 mL | Freq: Once | INTRAVENOUS | Status: AC
  Administered 2024-06-16: 500 mL via INTRAVENOUS

## 2024-06-16 MED ORDER — AMIODARONE IV BOLUS ONLY 150 MG/100ML
INTRAVENOUS | Status: AC
Start: 2024-06-16 — End: 2024-06-16
  Administered 2024-06-16: 150 mg via INTRAVENOUS
  Filled 2024-06-16: qty 100

## 2024-06-16 MED ORDER — SODIUM CHLORIDE 0.9 % IV SOLN
500.0000 mg | INTRAVENOUS | Status: DC
  Filled 2024-06-16: qty 5

## 2024-06-16 MED ORDER — PHENYLEPHRINE HCL-NACL 20-0.9 MG/250ML-% IV SOLN
25.0000 ug/min | INTRAVENOUS | Status: DC
  Administered 2024-06-16: 25 ug/min via INTRAVENOUS
  Administered 2024-06-16: 130 ug/min via INTRAVENOUS
  Administered 2024-06-16: 85 ug/min via INTRAVENOUS
  Administered 2024-06-16: 400 ug/min via INTRAVENOUS
  Filled 2024-06-16 (×5): qty 250

## 2024-06-16 MED ORDER — DIGOXIN 0.25 MG/ML IJ SOLN
0.5000 mg | Freq: Once | INTRAMUSCULAR | Status: AC
  Administered 2024-06-16: 0.5 mg via INTRAVENOUS
  Filled 2024-06-16: qty 2

## 2024-06-16 MED ORDER — AMIODARONE HCL IN DEXTROSE 360-4.14 MG/200ML-% IV SOLN
30.0000 mg/h | INTRAVENOUS | Status: DC
  Administered 2024-06-16 – 2024-06-21 (×11): 30 mg/h via INTRAVENOUS
  Filled 2024-06-16 (×13): qty 200

## 2024-06-16 MED ORDER — SODIUM CHLORIDE 0.9 % IV SOLN
250.0000 mL | INTRAVENOUS | Status: AC
  Administered 2024-06-16: 250 mL via INTRAVENOUS

## 2024-06-16 MED ORDER — SODIUM CHLORIDE 0.9 % IV SOLN
100.0000 mg | Freq: Two times a day (BID) | INTRAVENOUS | Status: AC
  Administered 2024-06-16 – 2024-06-20 (×9): 100 mg via INTRAVENOUS
  Filled 2024-06-16 (×11): qty 100

## 2024-06-16 MED ORDER — SODIUM CHLORIDE 0.9% FLUSH
10.0000 mL | Freq: Two times a day (BID) | INTRAVENOUS | Status: DC
  Administered 2024-06-16: 30 mL
  Administered 2024-06-16 – 2024-06-24 (×16): 10 mL
  Administered 2024-06-24: 40 mL
  Administered 2024-06-25 (×2): 10 mL
  Administered 2024-06-26: 20 mL
  Administered 2024-06-26 – 2024-06-27 (×2): 10 mL
  Administered 2024-06-27: 15 mL
  Administered 2024-06-28 (×2): 10 mL
  Administered 2024-06-29: 20 mL

## 2024-06-16 MED ORDER — VASOPRESSIN 20 UNITS/100 ML INFUSION FOR SHOCK
0.0000 [IU]/min | INTRAVENOUS | Status: DC
  Administered 2024-06-16 – 2024-06-18 (×3): 0.03 [IU]/min via INTRAVENOUS
  Filled 2024-06-16 (×5): qty 100

## 2024-06-16 MED ORDER — AMIODARONE IV BOLUS ONLY 150 MG/100ML: 150.0000 mg | Freq: Once | INTRAVENOUS | Status: AC

## 2024-06-16 NOTE — Plan of Care (Signed)
  Problem: Activity: Goal: Ability to tolerate increased activity will improve Outcome: Progressing   Problem: Respiratory: Goal: Ability to maintain a clear airway and adequate ventilation will improve Outcome: Progressing   Problem: Role Relationship: Goal: Method of communication will improve Outcome: Progressing   Problem: Education: Goal: Knowledge of General Education information will improve Description: Including pain rating scale, medication(s)/side effects and non-pharmacologic comfort measures Outcome: Progressing   Problem: Health Behavior/Discharge Planning: Goal: Ability to manage health-related needs will improve Outcome: Progressing   Problem: Clinical Measurements: Goal: Ability to maintain clinical measurements within normal limits will improve Outcome: Progressing Goal: Will remain free from infection Outcome: Progressing Goal: Diagnostic test results will improve Outcome: Progressing Goal: Respiratory complications will improve Outcome: Progressing Goal: Cardiovascular complication will be avoided Outcome: Progressing   Problem: Activity: Goal: Risk for activity intolerance will decrease Outcome: Progressing   Problem: Nutrition: Goal: Adequate nutrition will be maintained Outcome: Progressing   Problem: Coping: Goal: Level of anxiety will decrease Outcome: Progressing   Problem: Elimination: Goal: Will not experience complications related to bowel motility Outcome: Progressing Goal: Will not experience complications related to urinary retention Outcome: Progressing   Problem: Pain Managment: Goal: General experience of comfort will improve and/or be controlled Outcome: Progressing   Problem: Safety: Goal: Ability to remain free from injury will improve Outcome: Progressing   Problem: Skin Integrity: Goal: Risk for impaired skin integrity will decrease Outcome: Progressing   Problem: Education: Goal: Ability to identify signs and  symptoms of gastrointestinal bleeding will improve Outcome: Progressing   Problem: Bowel/Gastric: Goal: Will show no signs and symptoms of gastrointestinal bleeding Outcome: Progressing   Problem: Fluid Volume: Goal: Will show no signs and symptoms of excessive bleeding Outcome: Progressing   Problem: Clinical Measurements: Goal: Complications related to the disease process, condition or treatment will be avoided or minimized Outcome: Progressing   Problem: Education: Goal: Ability to demonstrate management of disease process will improve Outcome: Progressing Goal: Ability to verbalize understanding of medication therapies will improve Outcome: Progressing Goal: Individualized Educational Video(s) Outcome: Progressing   Problem: Activity: Goal: Capacity to carry out activities will improve Outcome: Progressing   Problem: Cardiac: Goal: Ability to achieve and maintain adequate cardiopulmonary perfusion will improve Outcome: Progressing   Problem: Education: Goal: Knowledge of disease or condition will improve Outcome: Progressing Goal: Understanding of medication regimen will improve Outcome: Progressing Goal: Individualized Educational Video(s) Outcome: Progressing   Problem: Activity: Goal: Ability to tolerate increased activity will improve Outcome: Progressing   Problem: Cardiac: Goal: Ability to achieve and maintain adequate cardiopulmonary perfusion will improve Outcome: Progressing   Problem: Health Behavior/Discharge Planning: Goal: Ability to safely manage health-related needs after discharge will improve Outcome: Progressing

## 2024-06-16 NOTE — Progress Notes (Signed)
 The patient underwent EGD with a Mallory-Weiss tear seen without any sign of active bleeding.  The tear appears to be healing.  The patient's hemoglobin has been stable.  Nothing further to do from GI point of view.  I will sign off.  Please call if any further GI concerns or questions.  We would like to thank you for the opportunity to participate in the care of [employer].

## 2024-06-16 NOTE — Progress Notes (Addendum)
 NAME:  [name], MRN:  [id number], DOB:  09-09-1950, LOS: 1 ADMISSION DATE:  06/15/2024, CONSULTATION DATE:  06/15/24 REFERRING MD:  Dr. Dorothyann  CHIEF COMPLAINT:  Hematemesis   History of Present Illness:  [name] is a 73 year old who presented to [facility] ED from home for evaluation of hematemesis.   History obtained per chart review and family bedside report. Patient was in her normal state of health until 11/21. Family reported she started feeling poorly with a cough, they denied congestion, complaints of dyspnea or chest pain. Then starting on 11/24 the patient began feeling nauseous and having hematemesis. Family denied any fever/ chills or any other complaints. And no previous episodes of hematemesis or GI bleeding that family is aware of. Patient is documented as complaining of abdominal pain, but family denies this. Family reports patient was on Eliquis  due to Chronic A-fib but stopped a year ago due to financial constraints. She had a recent PCP appointment and was expecting to hear from someone to assist her in obtaining medication, but hadn't heard anything yet. Family also reports some non-compliance with medication, that she tries not to take medicine if she doesn't need it. They deny any tobacco use/ ETOH use or recreational drug use history.    ED course: Upon arrival patient had a moderate episode of hematemesis in the waiting room. Initial vitals reveal tachypnea/ tachycardia/ hypertensive and hypoxic. Patient alert but altered requiring NRB to maintain oxygen. Due to hypoxic respiratory failure decision made to intubate the patient and place her on mechanical ventilatory support. No additional episodes of hematemesis. GI on call will follow along, not a candidate for emergent EGD as patient appears to have stabilized. CTa GIB scan ordered. Medications given: etomidate , versed , succinylcholine , morphine , zofran , 40 mg protonix , 1 L NS bolus, cefepime / vancomycin /  flagyl , propofol  drip started Initial Vitals: 98.2, 26, 152, 167/127 & 85% on RA   Significant labs:  EKG Interpretation: Date: 06/15/24, EKG Time: 19:52, Rate: 177, Rhythm: A-fib, QRS Axis:  RAD, Intervals: a-fib, ST/T Wave abnormalities: none (challenging read due to artifact), Narrative Interpretation: A-fib RVR Chemistry: Na+: 138, K+: 4.2, BUN/Cr.: 13/ 0.90, Serum CO2/ AG: 24/15, T.Bili: 1.4 Hematology: WBC: 5.3, Hgb: 12.2,  Troponin: 23, BNP: pending, Lactic/ PCT: 1.5/ pending, COVID-19 & Influenza A/B: pending ABG: 7.42/ 48 / 124/ 31.1   CXR 06/15/24: Interval placement of an endotracheal tube with the tip measuring 3.1 cm above the carina and an enteric tube with the tip off the field of view but below the left hemidiaphragm.2. Airspace infiltrates in the right perihilar region and throughout the left lung, similar to the prior study, which may represent pneumonia, edema, or aspiration. 3. Cardiac enlargement.  CT chest wo contrast 06/15/24: . Extensive bilateral airspace disease, most pronounced throughout the left lung and in the right lower lobe, concerning for pneumonia. 2. Trace bilateral pleural effusions. 3. Cardiomegaly, coronary artery disease. 4. Aortic atherosclerosis.  CT angio GIB protocol 06/15/24:  No active GI bleeding. 2. No occlusion or hemodynamically significant stenosis of the abdominal or pelvic arterial system. No abdominal aortic aneurysm or dissection. 3. Presacral perirectal 6.5 x 6.2 cm fluid collection, minimally changed since 2012 and likely benign. 4. Moderate-sized umbilical hernia containing fat and a small amount of fluid, increased in size since 2012   PCCM consulted for admission due to acute hypoxic respiratory failure s/t suspected aspiration pneumonia in the setting of suspected acute upper GIB on mechanical ventilatory support.  Pertinent Medical  History  Pulmonary Hypertension Thrombocytopenia Hypertension CAD Polyneuropathy PAF - Previously  on Eliquis  but stopped last year d/t financial constraints Cardiomyopathy (ECHO 07/2020 LVEF 60-65%; severe LA dilatation, mild RA dilatation, borderline pulmonary hypertension w/ PASP )  Significant Hospital Events: Including procedures, antibiotic start and stop dates in addition to other pertinent events   06/15/24: Admit to ICU due to acute hypoxic respiratory failure s/t suspected aspiration pneumonia in the setting of suspected acute upper GIB on mechanical ventilatory support. 06/16/24: Remains critically ill requiring mechanical ventilation with increasing pressor requirements. Central line and arterial line placed. GI following; plan for EGD today.  Interim History / Subjective:  See above listed under Significant Hospital Events.  Objective    Blood pressure 108/60, pulse (!) 131, temperature (!) 100.6 F (38.1 C), temperature source Rectal, resp. rate 17, height 5' 3 (1.6 m), weight (!) 138.4 kg, SpO2 99%.    Vent Mode: PRVC FiO2 (%):  [40 %-60 %] 40 % Set Rate:  [16 bmp] 16 bmp Vt Set:  [420 mL] 420 mL PEEP:  [5 cmH20] 5 cmH20 Plateau Pressure:  [27 cmH20] 27 cmH20   Intake/Output Summary (Last 24 hours) at 06/16/2024 9273 Last data filed at 06/16/2024 0700 Gross per 24 hour  Intake 2860.92 ml  Output 580 ml  Net 2280.92 ml   Filed Weights   06/15/24 1944 06/15/24 2340 06/16/24 0500  Weight: 127.9 kg 135.6 kg (!) 138.4 kg    Examination: General: critically ill, intubated and sedated in NAD HENT: atraumatic, normocephalic, supple, no jvd Lungs: coarse breath sounds bilaterally, synchronous with the vent Cardiovascular: atrial fibrillation with rvr, S1 S2, no m/r/g Abdomen: soft, mild distention, non-tender, no rebound/guarding, bowel sounds x 4 Extremities: warm, dry, lower extremity edema, radial pulses 2+, distal pulses 1+ Neuro: intubated and sedated, not following commands, PERRL GU: foley catheter draining clear yellow urine  Resolved problem list    Assessment and Plan   #Acute Metabolic Encephalopathy 06/15/24 CT Head wo contrast: no acute intracranial abnormality - PAD protocol - RASS goal of -1 - Continue propofol  to maintain RASS goal - PRN Versed  and Fentanyl  - Wean sedatives as able - Daily WUA as able - Encourage family support at bedside - Maintain appropriate sleep wake cycles  #Chronic/Paroxysmal Atrial Fibrillation with RVR #Septic Shock s/t Aspiration Pneumonia #Acute Decompensated Heart Failure s/t Septic Shock #Mildly Elevated Troponin s/t Demand Ischemia Hx: Hypertension, PAF, Pulmonary Hypertension, Cardiomyopathy - Continuous cardiac monitoring - Vasopressors to maintain MAP >65 ~ currently on neo, levo and vaso - Wean pressors as able - Arterial line placed in left radial for continuous BP monitoring - Left IJV CVC - Received digoxin  x 1 and 2 boluses of amiodarone  - Start amiodarone  gtt - EKG prn - Trend Troponin until peaked - BNP 5,289 - ECHO pending - Cardiology following; appreciate input  #Acute Hypoxic Respiratory Failure s/t aspiration in the setting of refractory vomiting and aspiration pneumonia 06/15/24 CT Chest wo contrast: extensive bilateral airspace disease, mostly throughout left lung and in right lower lobe; concern for pneumonia; trace bilateral pleural effusions; cardiomegaly, CAD. - Full vent support - FiO2 and PEEP to maintain O2 sats >92% - Maintain plateau pressure less than 30 cm H2O - Daily SBT once respiratory and mental status parameters met - VAP bundle - Ensure pulmonary hygiene - Duonebs prn - Intermittent CXR and ABG - Lactic normal at 1.5 - Procalcitonin 18.20 this am - Respiratory PCR (flu/covid/rsv): negative  #Mild AKI secondary to Septic Shock -  Strict I/O - Trend BMP and monitor renal function - Very mild elevation in Creatinine - Avoid nephrotoxins as able - Ensure renal perfusion - ICU electrolyte replacement protocol - Pharmacy to assist with  replacement  #Suspected Acute Upper Gastrointestinal Bleed with unknown etiology 06/15/24 CTA GI Bleed: no active GI bleed; no occlusion or HD significant stenosis of abdominal or pelvic arterial system. No AAA or dissection.  - Continue PPI: Protonix  40mg  - Constipation protocol prn - Diet: NPO - Antiemetics prn - Plan for EGD today to assess if bleed present +/- intervention - GI following; appreciate input  #Acute Blood Loss Anemia s/t Suspected Upper GI Bleed #Thrombocytopenia - Trend CBC - Monitor H/H and platelets - Transfuse if Hgb < 7; Hgb currently stable - Monitor for s/sx of bleeding - Type and screen - Check coags as indicated - DVT prophylaxis: SCDs  #Hyperglycemia w/o dx of Type II Diabetes Mellitus - ICU hypo/hyperglycemia protocol - Range 140-180s - CBG Q4h - SSI  #Aspiration Pneumonia - Trend WBC and monitor fever curve - Febrile today; continue scheduled tylenol  and ice packs  - Continue unasyn ; added azithromycin  for atypical coverage - Doxycycline  for EGD today - Narrow pending culture and sensitivities - Legionella, Strep pneumo urinary antigens pending - Blood cultures x 2 pending  Labs   CBC: Recent Labs  Lab 06/15/24 2005 06/16/24 0440  WBC 5.3  --   HGB 12.2 11.2*  HCT 37.2 35.9*  MCV 95.1  --   PLT 87*  --     Basic Metabolic Panel: Recent Labs  Lab 06/15/24 2005 06/16/24 0440  NA 138 137  K 4.2 3.6  CL 99 100  CO2 24 25  GLUCOSE 109* 95  BUN 13 16  CREATININE 0.90 1.01*  CALCIUM 9.3 8.5*  MG  --  1.8  PHOS  --  2.8   GFR: Estimated Creatinine Clearance: 69 mL/min (A) (by C-G formula based on SCr of 1.01 mg/dL (H)). Recent Labs  Lab 06/15/24 2005 06/15/24 2203 06/16/24 0440  PROCALCITON  --   --  18.20  WBC 5.3  --   --   LATICACIDVEN  --  1.5  --     Liver Function Tests: Recent Labs  Lab 06/15/24 2005  AST 23  ALT 6  ALKPHOS 76  BILITOT 1.4*  PROT 7.1  ALBUMIN 4.4   Recent Labs  Lab 06/15/24 2005   LIPASE 18   No results for input(s): AMMONIA in the last 168 hours.  ABG    Component Value Date/Time   PHART 7.37 06/16/2024 0501   PCO2ART 42 06/16/2024 0501   PO2ART 90 06/16/2024 0501   HCO3 24.3 06/16/2024 0501   ACIDBASEDEF 1.1 06/16/2024 0501   O2SAT 98.6 06/16/2024 0501     Coagulation Profile: Recent Labs  Lab 06/16/24 0443  INR 1.2    Cardiac Enzymes: No results for input(s): CKTOTAL, CKMB, CKMBINDEX, TROPONINI in the last 168 hours.  HbA1C: No results found for: HGBA1C  CBG: Recent Labs  Lab 06/15/24 2327 06/15/24 2355 06/16/24 0440  GLUCAP 67* 135* 83    Review of Systems:   Unable to assess as patient is intubated and sedated.  Past Medical History:  She,  has a past medical history of History of shingles, Knee pain, and Migraines.   Surgical History:   Past Surgical History:  Procedure Laterality Date   COLONOSCOPY WITH PROPOFOL  N/A 02/19/2020   Procedure: COLONOSCOPY WITH PROPOFOL ;  Surgeon: Janalyn Keene NOVAK, MD;  Location: [facility] ENDOSCOPY;  Service: Endoscopy;  Laterality: N/A;   REPLACEMENT TOTAL KNEE     left knee   TUBAL LIGATION       Social History:   reports that she has never smoked. She has never used smokeless tobacco. She reports that she does not drink alcohol and does not use drugs.   Family History:  Her family history includes Cancer in her brother and father; Heart disease in her mother; Hypertension in her mother.   Allergies No Known Allergies   Home Medications  Prior to Admission medications   Medication Sig Start Date End Date Taking? Authorizing Provider  Acetaminophen  (TYLENOL  8 HOUR PO) Take by mouth as needed.    [provider]  albuterol  (VENTOLIN  HFA) 108 (90 Base) MCG/ACT inhaler Inhale 1-2 puffs into the lungs every 6 (six) hours as needed for wheezing or shortness of breath. 01/25/21 01/25/22  Melvin Pao, NP  apixaban  (ELIQUIS ) 5 MG TABS tablet Take 1 tablet (5 mg total) by  mouth 2 (two) times daily. 12/22/21   Melvin Pao, NP  gabapentin  (NEURONTIN ) 600 MG tablet Take 1 tablet (600 mg total) by mouth 3 (three) times daily as needed. 12/22/21   Melvin Pao, NP  metoprolol  succinate (TOPROL -XL) 100 MG 24 hr tablet Take 1 tablet (100 mg total) by mouth in the morning and at bedtime. Take with or immediately following a meal. 08/25/21 11/23/21  Melvin Pao, NP  ondansetron  (ZOFRAN ) 4 MG tablet Take 1 tablet (4 mg total) by mouth every 8 (eight) hours as needed for nausea or vomiting. 08/10/20   Rumball, Alison M, DO  Spacer/Aero-[employer] [facility] DI[facility]OND) MISC by Does not apply route. 08/17/20   [provider]  torsemide  (DEMADEX ) 20 MG tablet TAKE 1 TABLET BY MOUTH EVERY DAY Patient not taking: Reported on 04/12/2022 12/08/21   Cindie Ole DASEN, MD     Critical care time: 65 minutes     Lillyanna Glandon, PA-C Pulmonary/Critical Care PCCM Team Contact Info: [phone]

## 2024-06-16 NOTE — Plan of Care (Signed)
 This patient is admitted to the AR-ICU overnight. The patient is orally intubated and mechanically ventilated. The patient has been unable thus far to follow commands. The patient is receiving active infusions of neo-synepherine and propofol  via PIV; PIV access only. The patient has a foley catheter in place. The patient has remained febrile overnight.   Problem: Activity: Goal: Ability to tolerate increased activity will improve Outcome: Progressing   Problem: Respiratory: Goal: Ability to maintain a clear airway and adequate ventilation will improve Outcome: Progressing   Problem: Role Relationship: Goal: Method of communication will improve Outcome: Progressing   Problem: Education: Goal: Knowledge of General Education information will improve Description: Including pain rating scale, medication(s)/side effects and non-pharmacologic comfort measures Outcome: Progressing   Problem: Health Behavior/Discharge Planning: Goal: Ability to manage health-related needs will improve Outcome: Progressing   Problem: Clinical Measurements: Goal: Ability to maintain clinical measurements within normal limits will improve Outcome: Progressing Goal: Will remain free from infection Outcome: Progressing Goal: Diagnostic test results will improve Outcome: Progressing Goal: Respiratory complications will improve Outcome: Progressing Goal: Cardiovascular complication will be avoided Outcome: Progressing   Problem: Activity: Goal: Risk for activity intolerance will decrease Outcome: Progressing   Problem: Nutrition: Goal: Adequate nutrition will be maintained Outcome: Progressing   Problem: Coping: Goal: Level of anxiety will decrease Outcome: Progressing   Problem: Elimination: Goal: Will not experience complications related to bowel motility Outcome: Progressing Goal: Will not experience complications related to urinary retention Outcome: Progressing   Problem: Pain  Managment: Goal: General experience of comfort will improve and/or be controlled Outcome: Progressing   Problem: Safety: Goal: Ability to remain free from injury will improve Outcome: Progressing   Problem: Skin Integrity: Goal: Risk for impaired skin integrity will decrease Outcome: Progressing   Problem: Education: Goal: Ability to identify signs and symptoms of gastrointestinal bleeding will improve Outcome: Progressing   Problem: Bowel/Gastric: Goal: Will show no signs and symptoms of gastrointestinal bleeding Outcome: Progressing   Problem: Fluid Volume: Goal: Will show no signs and symptoms of excessive bleeding Outcome: Progressing   Problem: Clinical Measurements: Goal: Complications related to the disease process, condition or treatment will be avoided or minimized Outcome: Progressing   Problem: Education: Goal: Ability to demonstrate management of disease process will improve Outcome: Progressing Goal: Ability to verbalize understanding of medication therapies will improve Outcome: Progressing Goal: Individualized Educational Video(s) Outcome: Progressing   Problem: Activity: Goal: Capacity to carry out activities will improve Outcome: Progressing   Problem: Cardiac: Goal: Ability to achieve and maintain adequate cardiopulmonary perfusion will improve Outcome: Progressing   Problem: Education: Goal: Knowledge of disease or condition will improve Outcome: Progressing Goal: Understanding of medication regimen will improve Outcome: Progressing Goal: Individualized Educational Video(s) Outcome: Progressing   Problem: Activity: Goal: Ability to tolerate increased activity will improve Outcome: Progressing   Problem: Cardiac: Goal: Ability to achieve and maintain adequate cardiopulmonary perfusion will improve Outcome: Progressing   Problem: Health Behavior/Discharge Planning: Goal: Ability to safely manage health-related needs after discharge will  improve Outcome: Progressing

## 2024-06-16 NOTE — Op Note (Signed)
 [facility] Gastroenterology Patient Name: [name] Procedure Date: 06/16/2024 2:34 PM MRN: [id number] Account #: [id number] Date of Birth: [birth year] Admit Type: Inpatient Age: 73 Room: [facility] ENDO ROOM 4 Gender: Female Note Status: Finalized Instrument Name: Endoscope [id number] Procedure:             Upper GI endoscopy Indications:           Hematemesis Providers:             Rogelia Copping MD, MD Referring MD:          No Local Md, MD (Referring MD) Medicines:             Propofol  per Anesthesia Complications:         No immediate complications. Procedure:             Pre-Anesthesia Assessment:                        - Prior to the procedure, a History and Physical was                         performed, and patient medications and allergies were                         reviewed. The patient's tolerance of previous                         anesthesia was also reviewed. The risks and benefits                         of the procedure and the sedation options and risks                         were discussed with the patient. All questions were                         answered, and informed consent was obtained. Prior                         Anticoagulants: The patient has taken no anticoagulant                         or antiplatelet agents. ASA Grade Assessment: III - A                         patient with severe systemic disease. After reviewing                         the risks and benefits, the patient was deemed in                         satisfactory condition to undergo the procedure.                        After obtaining informed consent, the endoscope was                         passed under direct vision. Throughout the procedure,  the patient's blood pressure, pulse, and oxygen                         saturations were monitored continuously. The Endoscope                         was introduced through the mouth, and advanced to the                          second part of duodenum. The upper GI endoscopy was                         accomplished without difficulty. The patient tolerated                         the procedure well. Findings:      A non-bleeding Mallory-Weiss tear with no stigmata of recent bleeding       was found.      The stomach was normal.      The examined duodenum was normal. Impression:            - Mallory-Weiss tear.                        - Normal stomach.                        - Normal examined duodenum.                        - No specimens collected. Recommendation:        - NPO.                        - Continue present medications. Procedure Code(s):     --- Professional ---                        [phone], Esophagogastroduodenoscopy, flexible,                         transoral; diagnostic, including collection of                         specimen(s) by brushing or washing, when performed                         (separate procedure) Diagnosis Code(s):     --- Professional ---                        K92.0, Hematemesis                        K22.6, Gastro-esophageal laceration-hemorrhage syndrome CPT copyright 2022 [employer]. All rights reserved. The codes documented in this report are preliminary and upon coder review may  be revised to meet current compliance requirements. Rogelia Copping MD, MD 06/16/2024 2:42:59 PM This report has been signed electronically. Number of Addenda: 0 Note Initiated On: 06/16/2024 2:34 PM Estimated Blood Loss:  Estimated blood loss: none.      [facility]

## 2024-06-16 NOTE — Procedures (Signed)
 Central Venous Catheter Insertion Procedure Note  LILIA LETTERMAN  [id number]  09-03-1950  Date:06/16/24  Time:9:41 AM   Provider Performing:Deontray Hunnicutt D Shellia   Procedure: Insertion of Non-tunneled Central Venous [phone]) with US  guidance (23062)   Indication(s) Medication administration and Difficult access  Consent Unable to obtain consent due to emergent nature of procedure.  Anesthesia Topical only with 1% lidocaine    Timeout Verified patient identification, verified procedure, site/side was marked, verified correct patient position, special equipment/implants available, medications/allergies/relevant history reviewed, required imaging and test results available.  Sterile Technique Maximal sterile technique including full sterile barrier drape, hand hygiene, sterile gown, sterile gloves, mask, hair covering, sterile ultrasound probe cover (if used).  Procedure Description Area of catheter insertion was cleaned with chlorhexidine  and draped in sterile fashion.  With real-time ultrasound guidance a central venous catheter was placed into the left internal jugular vein. Nonpulsatile blood flow and easy flushing noted in all ports.  The catheter was sutured in place and sterile dressing applied.  Complications/Tolerance None; patient tolerated the procedure well. Chest X-ray is ordered to verify placement for internal jugular or subclavian cannulation.   Chest x-ray is not ordered for femoral cannulation.  EBL Minimal  Specimen(s) None    Line inserted to the 20 cm mark.    Inge Shellia, AGACNP-BC [facility] Prefer epic messenger for cross cover needs If after hours, please call E-link

## 2024-06-16 NOTE — Procedures (Signed)
 Arterial Catheter Insertion Procedure Note  CARON ODE  [id number]  1951-06-23  Date:06/16/24  Time:11:43 AM    Provider Performing: Robet Kim    Procedure: Insertion of Arterial Line (63379) with US  guidance (23062)   Indication(s) Blood pressure monitoring and/or need for frequent ABGs  Consent Risks of the procedure as well as the alternatives and risks of each were explained to the patient and/or caregiver.  Consent for the procedure was obtained and is signed in the bedside chart  Anesthesia None   Time Out Verified patient identification, verified procedure, site/side was marked, verified correct patient position, special equipment/implants available, medications/allergies/relevant history reviewed, required imaging and test results available.   Sterile Technique Maximal sterile technique including full sterile barrier drape, hand hygiene, sterile gown, sterile gloves, mask, hair covering, sterile ultrasound probe cover (if used).   Procedure Description Area of catheter insertion was cleaned with chlorhexidine  and draped in sterile fashion. With real-time ultrasound guidance an arterial catheter was placed into the left radial artery.  Appropriate arterial tracings confirmed on monitor.     Complications/Tolerance None; patient tolerated the procedure well.   EBL Minimal   Specimen(s) None  Robet Kim, PA-C Pulmonary/Critical Care PCCM Team Contact Info: [phone]

## 2024-06-16 NOTE — Consult Note (Addendum)
 [facility] CLINIC CARDIOLOGY CONSULT NOTE       Patient ID: [name] MRN: [id number] DOB/AGE: 03/26/1951 73 y.o.  Admit date: 06/15/2024 Referring Physician Inge Lecher, NP (PCCM) Primary Physician Odell Chard, Edra GRADE, MD  Primary Cardiologist [facility] Reason for Consultation AF RVR  HPI: [name] is a 73 y.o. female  with a past medical history of chronic atrial fibrillation noncompliant with Eliquis , migraines who presented to the ED on 06/15/2024 for nausea, hematemesis.  Earlier today developed rapid A-fib.  Cardiology was consulted for further evaluation.   History obtained via chart review as patient is intubated, sedated.  Was brought in by family for evaluation of nausea, hematemesis.  Workup in the ED notable for creatinine 0.90, potassium 4.2, hemoglobin 12.2, WBC 5.3. Troponins 23 > 39, BNP 5289. EKG this a.m. atrial fibrillation RVR rate 127 bpm.  CXR today with mild pulmonary edema, small left pleural effusion.  Concern for declining respiratory status in the ED and thus she was intubated for airway protection.  GI was consulted for possible need of endoscopy.  At the time my evaluation this morning, patient is laying in hospital bed, intubated and sedated.  No family at bedside.  She is currently on multiple vasopressor medications for BP support.  Heart rate is elevated in the 120s, atrial fibrillation.  BNP was noted to be elevated at 5000, coarse breath sounds but she is without significant lower extremity edema.  Review of systems complete and found to be negative unless listed above    Past Medical History:  Diagnosis Date   History of shingles    Knee pain    Migraines     Past Surgical History:  Procedure Laterality Date   COLONOSCOPY WITH PROPOFOL  N/A 02/19/2020   Procedure: COLONOSCOPY WITH PROPOFOL ;  Surgeon: Janalyn Keene NOVAK, MD;  Location: [facility] ENDOSCOPY;  Service: Endoscopy;  Laterality: N/A;   REPLACEMENT TOTAL KNEE     left knee    TUBAL LIGATION      Medications Prior to Admission  Medication Sig Dispense Refill Last Dose/Taking   Acetaminophen  (TYLENOL  8 HOUR PO) Take by mouth as needed.      albuterol  (VENTOLIN  HFA) 108 (90 Base) MCG/ACT inhaler Inhale 1-2 puffs into the lungs every 6 (six) hours as needed for wheezing or shortness of breath. 18 g 1    apixaban  (ELIQUIS ) 5 MG TABS tablet Take 1 tablet (5 mg total) by mouth 2 (two) times daily. 180 tablet 1    gabapentin  (NEURONTIN ) 600 MG tablet Take 1 tablet (600 mg total) by mouth 3 (three) times daily as needed. 270 tablet 1    metoprolol  succinate (TOPROL -XL) 100 MG 24 hr tablet Take 1 tablet (100 mg total) by mouth in the morning and at bedtime. Take with or immediately following a meal. 180 tablet 1    ondansetron  (ZOFRAN ) 4 MG tablet Take 1 tablet (4 mg total) by mouth every 8 (eight) hours as needed for nausea or vomiting. 20 tablet 0    Spacer/Aero-Holding Chambers (OPTICHAMBER DIAMOND) MISC by Does not apply route.      torsemide  (DEMADEX ) 20 MG tablet TAKE 1 TABLET BY MOUTH EVERY DAY (Patient not taking: Reported on 04/12/2022) 90 tablet 0    Social History   Socioeconomic History   Marital status: Widowed    Spouse name: Not on file   Number of children: Not on file   Years of education: Not on file   Highest education level: Not on file  Occupational History   Occupation: retired  Tobacco Use   Smoking status: Never   Smokeless tobacco: Never  Vaping Use   Vaping status: Never Used  Substance and Sexual Activity   Alcohol use: No   Drug use: No   Sexual activity: Not Currently  Other Topics Concern   Not on file  Social History Narrative   Not on file   Social Drivers of Health   Financial Resource Strain: Low Risk  (04/12/2022)   Overall Financial Resource Strain (CARDIA)    Difficulty of Paying Living Expenses: Not hard at all  Food Insecurity: No Food Insecurity (04/12/2022)   Hunger Vital Sign    Worried About Running Out of Food in  the Last Year: Never true    Ran Out of Food in the Last Year: Never true  Transportation Needs: No Transportation Needs (04/12/2022)   PRAPARE - Administrator, Civil Service (Medical): No    Lack of Transportation (Non-Medical): No  Physical Activity: Inactive (04/12/2022)   Exercise Vital Sign    Days of Exercise per Week: 0 days    Minutes of Exercise per Session: 0 min  Stress: No Stress Concern Present (04/12/2022)   [employer] of Occupational Health - Occupational Stress Questionnaire    Feeling of Stress : Only a little  Social Connections: Socially Isolated (04/12/2022)   Social Connection and Isolation Panel    Frequency of Communication with Friends and Family: More than three times a week    Frequency of Social Gatherings with Friends and Family: Once a week    Attends Religious Services: Never    Database Administrator or Organizations: No    Attends Banker Meetings: Never    Marital Status: Widowed  Intimate Partner Violence: Not At Risk (04/12/2022)   Humiliation, Afraid, Rape, and Kick questionnaire    Fear of Current or Ex-Partner: No    Emotionally Abused: No    Physically Abused: No    Sexually Abused: No    Family History  Problem Relation Age of Onset   Heart disease Mother    Hypertension Mother    Cancer Father    Cancer Brother      Vitals:   06/16/24 0700 06/16/24 0715 06/16/24 0745 06/16/24 0800  BP: 108/60 107/64 90/65   Pulse: (!) 131  100   Resp: 17 18 18    Temp: (!) 100.6 F (38.1 C) (!) 100.6 F (38.1 C) (!) 100.8 F (38.2 C)   TempSrc: Rectal Rectal Rectal   SpO2: 99%  99% 100%  Weight:      Height:        PHYSICAL EXAM General: Ill-appearing female, intubated and sedated. HEENT: Normocephalic and atraumatic. Neck: No JVD.  Lungs: Mechanical breath sounds, coarse bilaterally Heart: Irregularly irregular, elevated rate. Normal S1 and S2 without gallops or murmurs.  Abdomen: Non-distended appearing.   Msk: Normal strength and tone for age. Extremities: Warm and well perfused. No clubbing, cyanosis.  Trace edema.   Labs: Basic Metabolic Panel: Recent Labs    06/15/24 2005 06/16/24 0440  NA 138 137  K 4.2 3.6  CL 99 100  CO2 24 25  GLUCOSE 109* 95  BUN 13 16  CREATININE 0.90 1.01*  CALCIUM 9.3 8.5*  MG  --  1.8  PHOS  --  2.8   Liver Function Tests: Recent Labs    06/15/24 2005  AST 23  ALT 6  ALKPHOS 76  BILITOT 1.4*  PROT 7.1  ALBUMIN 4.4   Recent Labs    06/15/24 2005  LIPASE 18   CBC: Recent Labs    06/15/24 2005 06/16/24 0440  WBC 5.3  --   HGB 12.2 11.2*  HCT 37.2 35.9*  MCV 95.1  --   PLT 87*  --    Cardiac Enzymes: No results for input(s): CKTOTAL, CKMB, CKMBINDEX, TROPONINIHS in the last 72 hours. BNP: No results for input(s): BNP in the last 72 hours. D-Dimer: No results for input(s): DDIMER in the last 72 hours. Hemoglobin A1C: No results for input(s): HGBA1C in the last 72 hours. Fasting Lipid Panel: Recent Labs    06/16/24 0440  TRIG 70   Thyroid  Function Tests: No results for input(s): TSH, T4TOTAL, T3FREE, THYROIDAB in the last 72 hours.  Invalid input(s): FREET3 Anemia Panel: No results for input(s): VITAMINB12, FOLATE, FERRITIN, TIBC, IRON, RETICCTPCT in the last 72 hours.   Radiology: DG Chest 1 View Result Date: 06/16/2024 EXAM: 1 VIEW(S) XRAY OF THE CHEST 06/16/2024 09:48:00 AM COMPARISON: 06/15/2024 CLINICAL HISTORY: Encounter for central line placement [id number] FINDINGS: LINES, TUBES AND DEVICES: Endotracheal tube in place with tip 1.3 cm above the carina. Left internal jugular central venous catheter terminates in the region of the distal superior vena cava. Enteric tube extends into the stomach. LUNGS AND PLEURA: Mild pulmonary edema. Stable bilateral airspace opacities. Small left pleural effusion. Low lung volumes. No pneumothorax. HEART AND MEDIASTINUM: Persistent cardiomegaly. Aortic  atherosclerosis. BONES AND SOFT TISSUES: No acute osseous abnormality. IMPRESSION: 1. Mild pulmonary edema and stable bilateral airspace opacities. 2. Small left pleural effusion. 3. Persistent cardiomegaly and aortic atherosclerosis. Electronically signed by: Evalene Coho MD 06/16/2024 10:13 AM EST RP Workstation: [id number]   CT CHEST WO CONTRAST Result Date: 06/15/2024 EXAM: CT CHEST WITHOUT CONTRAST 06/15/2024 11:23:23 PM TECHNIQUE: CT of the chest was performed without the administration of intravenous contrast. Multiplanar reformatted images are provided for review. Automated exposure control, iterative reconstruction, and/or weight based adjustment of the mA/kV was utilized to reduce the radiation dose to as low as reasonably achievable. COMPARISON: 05/27/2021. CLINICAL HISTORY: Aspiration. FINDINGS: MEDIASTINUM: There was a ball in the middle of the esophagus. Cardiomegaly. Coronary artery and aortic atherosclerosis. Endotracheal tube tip is in the lower trachea above the carina. Pericardium is unremarkable. LYMPH NODES: No mediastinal, hilar or axillary lymphadenopathy. LUNGS AND PLEURA: Extensive bilateral airspace disease, most pronounced throughout the left lung and in the right lower lobe. Findings concerning for pneumonia. Trace bilateral pleural effusions. No pneumothorax. SOFT TISSUES/BONES: No acute abnormality of the bones or soft tissues. UPPER ABDOMEN: Limited images of the upper abdomen demonstrates no acute abnormality. IMPRESSION: 1. Extensive bilateral airspace disease, most pronounced throughout the left lung and in the right lower lobe, concerning for pneumonia. 2. Trace bilateral pleural effusions. 3. Cardiomegaly, coronary artery disease . 4. Aortic atherosclerosis. Electronically signed by: Franky Crease MD 06/15/2024 11:44 PM EST RP Workstation: [id number]   CT ANGIO GI BLEED Result Date: 06/15/2024 EXAM: CTA ABDOMEN AND PELVIS WITH CONTRAST 06/15/2024 11:23:23 PM TECHNIQUE:  CTA images of the abdomen and pelvis with intravenous contrast. 125 mL of iohexol  (OMNIPAQUE ) 350 MG/ML injection was administered. Three-dimensional MIP/volume rendered formations were performed. Automated exposure control, iterative reconstruction, and/or weight based adjustment of the mA/kV was utilized to reduce the radiation dose to as low as reasonably achievable. COMPARISON: Comparison is made to a study from 2012. CLINICAL HISTORY: LUQ abdominal pain; hematemesis, abd pain. FINDINGS: VASCULATURE: GI BLEED: No active extravasation of contrast  within the GI tract. AORTA: Aortic atherosclerosis. No acute finding. No abdominal aortic aneurysm. No dissection. CELIAC TRUNK: No acute finding. No occlusion or significant stenosis. SUPERIOR MESENTERIC ARTERY: No acute finding. No occlusion or significant stenosis. INFERIOR MESENTERIC ARTERY: No acute finding. No occlusion or significant stenosis. RENAL ARTERIES: No acute finding. No occlusion or significant stenosis. ILIAC ARTERIES: No acute finding. No occlusion or significant stenosis. ABDOMEN/PELVIS: LOWER CHEST: See chest CT report today. LIVER: The liver is unremarkable. GALLBLADDER AND BILE DUCTS: Gallbladder is unremarkable. No biliary ductal dilatation. SPLEEN: The spleen is unremarkable. PANCREAS: The pancreas is unremarkable. ADRENAL GLANDS: Bilateral adrenal glands demonstrate no acute abnormality. KIDNEYS, URETERS AND BLADDER: No stones in the kidneys or ureters. No hydronephrosis. No perinephric or periureteral stranding. Foley catheter in the bladder, which is decompressed. GI AND BOWEL: NG tube in the stomach. Stomach and duodenal sweep demonstrate no acute abnormality. Normal appendix. There is no bowel obstruction. No abnormal bowel wall thickening or distension. REPRODUCTIVE: Reproductive organs are unremarkable. PERITONEUM AND RETROPERITONEUM: 6.5 x 6.2 cm presacral perirectal fluid collection noted. This was present in 2012 when this measured 6.3  x 4.1 cm. The very slow growth over 13 years suggests a benign process. Moderate-sized umbilical hernia containing fat and a small amount of fluid. This has enlarged since 2012. No ascites or free air. LYMPH NODES: No lymphadenopathy. BONES AND SOFT TISSUES: No acute abnormality of the bones. No acute soft tissue abnormality. IMPRESSION: 1. No active GI bleeding. 2. No occlusion or hemodynamically significant stenosis of the abdominal or pelvic arterial system. No abdominal aortic aneurysm or dissection. 3. Presacral perirectal 6.5 x 6.2 cm fluid collection, minimally changed since 2012 and likely benign. 4. Moderate-sized umbilical hernia containing fat and a small amount of fluid, increased in size since 2012. Electronically signed by: Franky Crease MD 06/15/2024 11:42 PM EST RP Workstation: [id number]   CT HEAD WO CONTRAST ( ) Result Date: 06/15/2024 EXAM: CT HEAD WITHOUT CONTRAST 06/15/2024 11:23:23 PM TECHNIQUE: CT of the head was performed without the administration of intravenous contrast. Automated exposure control, iterative reconstruction, and/or weight based adjustment of the mA/kV was utilized to reduce the radiation dose to as low as reasonably achievable. COMPARISON: 09/01/2015. CLINICAL HISTORY: Mental status change, unknown cause. FINDINGS: BRAIN AND VENTRICLES: No acute hemorrhage. No evidence of acute infarct. No hydrocephalus. No extra-axial collection. No mass effect or midline shift. ORBITS: Soft tissue swelling along the lateral aspect of the left orbit. SINUSES: Mucosal thickening in ethmoid air cells. SOFT TISSUES AND SKULL: Partially visualized enteric tube. No acute soft tissue abnormality. No skull fracture. IMPRESSION: 1. No acute intracranial abnormality. 2. Soft tissue swelling along the lateral aspect of the left orbit. 3. Mucosal thickening in ethmoid air cells. Electronically signed by: Donnice Mania MD 06/15/2024 11:33 PM EST RP Workstation: [id number]   DG Abd Portable 1  View Result Date: 06/15/2024 EXAM: 1 VIEW XRAY OF THE ABDOMEN 06/15/2024 09:23:00 PM COMPARISON: None available. CLINICAL HISTORY: OB tube insertion. FINDINGS: LIMITATIONS: Limited field of view for tube placement verification purposes. LINES, TUBES AND DEVICES: An enteric tube has been placed with the tip projecting over the upper mid-abdomen, consistent with its location in the upper stomach. BOWEL: Nonobstructive bowel gas pattern. SOFT TISSUES: No opaque urinary calculi. BONES: No acute osseous abnormality. LUNGS: Infiltrates demonstrated in the left lung base. IMPRESSION: 1. Enteric tube tip projects over the upper mid-abdomen, consistent with its location in the upper stomach. 2. Left basilar pulmonary infiltrates. Electronically signed by: Elsie Gravely MD  06/15/2024 09:27 PM EST RP Workstation: [id number]   DG Chest Port 1 View Result Date: 06/15/2024 EXAM: 1 VIEW(S) XRAY OF THE CHEST 06/15/2024 09:23:00 PM COMPARISON: Comparison with 06/15/2024. CLINICAL HISTORY: [id number] Encounter for intubation [id number] Encounter for intubation. FINDINGS: LINES, TUBES AND DEVICES: Interval placement of an endotracheal tube with the tip measuring 3.1 cm above the carina. An enteric tube has been placed. The tip is off the field of view but below the left hemidiaphragm. LUNGS AND PLEURA: Shallow inspiration. Airspace infiltrates in the right perihilar region and throughout the left lung, similar to the prior study. This may represent pneumonia, edema, or aspiration. No pleural effusion. No pneumothorax. HEART AND MEDIASTINUM: Cardiac enlargement. Calcification of the aorta. BONES AND SOFT TISSUES: No acute osseous abnormality. IMPRESSION: 1. Interval placement of an endotracheal tube with the tip measuring 3.1 cm above the carina and an enteric tube with the tip off the field of view but below the left hemidiaphragm. 2. Airspace infiltrates in the right perihilar region and throughout the left lung, similar to the prior  study, which may represent pneumonia, edema, or aspiration. 3. Cardiac enlargement. Electronically signed by: Elsie Gravely MD 06/15/2024 09:26 PM EST RP Workstation: [id number]   DG Chest Portable 1 View Result Date: 06/15/2024 CLINICAL DATA:  Cough, hematemesis EXAM: PORTABLE CHEST 1 VIEW COMPARISON:  08/28/2021 FINDINGS: Single frontal view of the chest demonstrates an enlarged cardiac silhouette. There is left perihilar airspace disease, consistent with hemorrhage, asymmetric edema, or infection. No effusion or pneumothorax. The right chest is clear. IMPRESSION: 1. Left perihilar airspace disease consistent with hemorrhage, infection, or asymmetric edema. 2. Enlarged cardiac silhouette. Electronically Signed   By: Ozell Daring M.D.   On: 06/15/2024 20:37    ECHO ordered  TELEMETRY (personally reviewed): Atrial fibrillation rate 120s.  EKG (personally reviewed): Atrial fibrillation RVR rate 127 bpm  Data reviewed by me 06/16/2024: last 24h vitals tele labs imaging I/O ED provider note, admission H&P  Principal Problem:   GIB (gastrointestinal bleeding) Active Problems:   Shock (HCC)    ASSESSMENT AND PLAN:  [name] is a 73 y.o. female  with a past medical history of chronic atrial fibrillation noncompliant with Eliquis , migraines who presented to the ED on 06/15/2024 for nausea, hematemesis.  Earlier today developed rapid A-fib.  Cardiology was consulted for further evaluation.   # Atrial fibrillation RVR # Chronic atrial fibrillation # Acute hypoxic respiratory failure # Acute HFpEF # GI bleed Patient presented with nausea, hematemesis.  Hemoglobin 12.2 > 11.2.  BNP elevated at 5289.  Chest x-ray concerning for pulmonary edema.  Has known history of atrial fibrillation and overnight into this morning has been noted to have rapid rates.  Given an IV amiodarone  bolus this morning. - Echo ordered.  Further recommendations pending these results. - Will rebolus and start  amio infusion. - Given concern for GI bleed will hold off on initiation of anticoagulation for atrial fibrillation at this time.  GI consulted, will await their recommendations. - Will defer IV Lasix  as she is requiring multiple IV pressors currently. - Further management of shock, decompensated respiratory status as per PCCM. - Patient is critically ill with multiple comorbidities.  Will continue to follow closely but overall prognosis is guarded.   This patient's plan of care was discussed and created with Dr. Custovic and she is in agreement.  Signed: Danita Bloch, PA-C  06/16/2024, 10:51 AM [facility]

## 2024-06-16 NOTE — Consult Note (Signed)
 Sue Copping, MD [facility]  [address], [address] [place], [place] [address] Phone: [phone] Fax : [phone]  Consultation  Referring Provider:     Dr. Dorothyann Primary Care Physician:  Odell Chard, Edra GRADE, MD Primary Gastroenterologist: [facility]         Reason for Consultation:     Hematemesis  Date of Admission:  06/15/2024 Date of Consultation:  06/16/2024         HPI:   [name] is a 73 y.o. female who has a history of migraine headaches and atrial fibrillation who is presently not taking any anticoagulation for this.  She presents to the emergency department with nausea vomiting and trouble swallowing with bloody vomitus.  She had reported to the ED physician that she had been having these for the last 3 days prior to admission with abdominal pain in the upper abdomen associated with nausea.  On the day of admission she had been vomiting blood and vomited a moderate amount of blood in the ED when she arrived.  The patient's pulse ox was low and she was intubated and a GI consult was called. The patient was admitted to the ICU and is intubated and sedated at the present time.  She is also receiving pressors.  The patient's blood count showed:  Component     Latest Ref Rng 10/10/2021 06/15/2024 06/16/2024  Hemoglobin     12.0 - 15.0 g/dL 86.8  87.7  88.7 (L)   Hemoglobin        11.2 (L)   HCT     36.0 - 46.0 % 39.0  37.2  34.3 (L)   HCT        35.9 (L)    And was admitted with a white cell count of 5.3 that this morning went up to 22.2.  There is some discussion about extubating the patient soon and a GI consult for an EGD was obtained due to the desire to do her procedure prior to extubation.  Past Medical History:  Diagnosis Date   History of shingles    Knee pain    Migraines     Past Surgical History:  Procedure Laterality Date   COLONOSCOPY WITH PROPOFOL  N/A 02/19/2020   Procedure: COLONOSCOPY WITH PROPOFOL ;  Surgeon: Janalyn Keene NOVAK,  MD;  Location: [facility] ENDOSCOPY;  Service: Endoscopy;  Laterality: N/A;   REPLACEMENT TOTAL KNEE     left knee   TUBAL LIGATION      Prior to Admission medications   Medication Sig Start Date End Date Taking? Authorizing Provider  Acetaminophen  (TYLENOL  8 HOUR PO) Take by mouth as needed.    [provider]  albuterol  (VENTOLIN  HFA) 108 (90 Base) MCG/ACT inhaler Inhale 1-2 puffs into the lungs every 6 (six) hours as needed for wheezing or shortness of breath. 01/25/21 01/25/22  Melvin Pao, NP  apixaban  (ELIQUIS ) 5 MG TABS tablet Take 1 tablet (5 mg total) by mouth 2 (two) times daily. 12/22/21   Melvin Pao, NP  gabapentin  (NEURONTIN ) 600 MG tablet Take 1 tablet (600 mg total) by mouth 3 (three) times daily as needed. 12/22/21   Melvin Pao, NP  metoprolol  succinate (TOPROL -XL) 100 MG 24 hr tablet Take 1 tablet (100 mg total) by mouth in the morning and at bedtime. Take with or immediately following a meal. 08/25/21 11/23/21  Melvin Pao, NP  ondansetron  (ZOFRAN ) 4 MG tablet Take 1 tablet (4 mg total) by mouth every 8 (eight) hours  as needed for nausea or vomiting. 08/10/20   Rumball, Alison M, DO  Spacer/Aero-Holding [facility] DIAMOND) MISC by Does not apply route. 08/17/20   [provider]  torsemide  (DEMADEX ) 20 MG tablet TAKE 1 TABLET BY MOUTH EVERY DAY Patient not taking: Reported on 04/12/2022 12/08/21   Cindie Ole DASEN, MD    Family History  Problem Relation Age of Onset   Heart disease Mother    Hypertension Mother    Cancer Father    Cancer Brother      Social History   Tobacco Use   Smoking status: Never   Smokeless tobacco: Never  Vaping Use   Vaping status: Never Used  Substance Use Topics   Alcohol use: No   Drug use: No    Allergies as of 06/15/2024   (No Known Allergies)    Review of Systems:    All systems reviewed and negative except where noted in HPI.   Physical Exam:  Vital signs in last 24 hours: Temp:   [98.2 F (36.8 C)-101.7 F (38.7 C)] 100.8 F (38.2 C) (11/25 0745) Pulse Rate:  [73-153] 100 (11/25 0745) Resp:  [15-31] 18 (11/25 0745) BP: (69-181)/(36-127) 90/65 (11/25 0745) SpO2:  [85 %-100 %] 100 % (11/25 1215) FiO2 (%):  [40 %-60 %] 40 % (11/25 1215) Weight:  [127.9 kg-138.4 kg] 138.4 kg (11/25 0500)   General : The patient is intubated and sedated and unable to answer questions  Head:  Normocephalic and atraumatic. Eyes:   No icterus.   Conjunctiva pink. PERRLA. Ears:  Normal auditory acuity. Neck:  Supple; no masses or thyroidomegaly Lungs: Respirations even and unlabored. Lungs clear to auscultation bilaterally.   No wheezes, crackles, or rhonchi.  Heart:  Regular rate and rhythm;  Without murmur, clicks, rubs or gallops Abdomen:  Soft, nondistended, nontender. Normal bowel sounds. No appreciable masses or hepatomegaly.  No rebound or guarding.  Rectal:  Not performed. Msk:  Symmetrical without gross deformities.   Extremities:  Without edema, cyanosis or clubbing. Neurologic: Unable to ascertain Skin:  Intact without significant lesions or rashes. Cervical Nodes:  No significant cervical adenopathy.   LAB RESULTS: Recent Labs    06/15/24 2005 06/16/24 0440 06/16/24 0944  WBC 5.3  --  22.2*  HGB 12.2 11.2* 11.2*  HCT 37.2 35.9* 34.3*  PLT 87*  --  119*   BMET Recent Labs    06/15/24 2005 06/16/24 0440  NA 138 137  K 4.2 3.6  CL 99 100  CO2 24 25  GLUCOSE 109* 95  BUN 13 16  CREATININE 0.90 1.01*  CALCIUM 9.3 8.5*   LFT Recent Labs    06/15/24 2005  PROT 7.1  ALBUMIN 4.4  AST 23  ALT 6  ALKPHOS 76  BILITOT 1.4*   PT/INR Recent Labs    06/16/24 0443  LABPROT 15.4*  INR 1.2    STUDIES: DG Chest 1 View Result Date: 06/16/2024 EXAM: 1 VIEW(S) XRAY OF THE CHEST 06/16/2024 09:48:00 AM COMPARISON: 06/15/2024 CLINICAL HISTORY: Encounter for central line placement [id number] FINDINGS: LINES, TUBES AND DEVICES: Endotracheal tube in place with tip  1.3 cm above the carina. Left internal jugular central venous catheter terminates in the region of the distal superior vena cava. Enteric tube extends into the stomach. LUNGS AND PLEURA: Mild pulmonary edema. Stable bilateral airspace opacities. Small left pleural effusion. Low lung volumes. No pneumothorax. HEART AND MEDIASTINUM: Persistent cardiomegaly. Aortic atherosclerosis. BONES AND SOFT TISSUES: No acute osseous abnormality. IMPRESSION: 1. Mild  pulmonary edema and stable bilateral airspace opacities. 2. Small left pleural effusion. 3. Persistent cardiomegaly and aortic atherosclerosis. Electronically signed by: Evalene Coho MD 06/16/2024 10:13 AM EST RP Workstation: [id number]   CT CHEST WO CONTRAST Result Date: 06/15/2024 EXAM: CT CHEST WITHOUT CONTRAST 06/15/2024 11:23:23 PM TECHNIQUE: CT of the chest was performed without the administration of intravenous contrast. Multiplanar reformatted images are provided for review. Automated exposure control, iterative reconstruction, and/or weight based adjustment of the mA/kV was utilized to reduce the radiation dose to as low as reasonably achievable. COMPARISON: 05/27/2021. CLINICAL HISTORY: Aspiration. FINDINGS: MEDIASTINUM: There was a ball in the middle of the esophagus. Cardiomegaly. Coronary artery and aortic atherosclerosis. Endotracheal tube tip is in the lower trachea above the carina. Pericardium is unremarkable. LYMPH NODES: No mediastinal, hilar or axillary lymphadenopathy. LUNGS AND PLEURA: Extensive bilateral airspace disease, most pronounced throughout the left lung and in the right lower lobe. Findings concerning for pneumonia. Trace bilateral pleural effusions. No pneumothorax. SOFT TISSUES/BONES: No acute abnormality of the bones or soft tissues. UPPER ABDOMEN: Limited images of the upper abdomen demonstrates no acute abnormality. IMPRESSION: 1. Extensive bilateral airspace disease, most pronounced throughout the left lung and in the right  lower lobe, concerning for pneumonia. 2. Trace bilateral pleural effusions. 3. Cardiomegaly, coronary artery disease . 4. Aortic atherosclerosis. Electronically signed by: Franky Crease MD 06/15/2024 11:44 PM EST RP Workstation: [id number]   CT ANGIO GI BLEED Result Date: 06/15/2024 EXAM: CTA ABDOMEN AND PELVIS WITH CONTRAST 06/15/2024 11:23:23 PM TECHNIQUE: CTA images of the abdomen and pelvis with intravenous contrast. 125 mL of iohexol  (OMNIPAQUE ) 350 MG/ML injection was administered. Three-dimensional MIP/volume rendered formations were performed. Automated exposure control, iterative reconstruction, and/or weight based adjustment of the mA/kV was utilized to reduce the radiation dose to as low as reasonably achievable. COMPARISON: Comparison is made to a study from 2012. CLINICAL HISTORY: LUQ abdominal pain; hematemesis, abd pain. FINDINGS: VASCULATURE: GI BLEED: No active extravasation of contrast within the GI tract. AORTA: Aortic atherosclerosis. No acute finding. No abdominal aortic aneurysm. No dissection. CELIAC TRUNK: No acute finding. No occlusion or significant stenosis. SUPERIOR MESENTERIC ARTERY: No acute finding. No occlusion or significant stenosis. INFERIOR MESENTERIC ARTERY: No acute finding. No occlusion or significant stenosis. RENAL ARTERIES: No acute finding. No occlusion or significant stenosis. ILIAC ARTERIES: No acute finding. No occlusion or significant stenosis. ABDOMEN/PELVIS: LOWER CHEST: See chest CT report today. LIVER: The liver is unremarkable. GALLBLADDER AND BILE DUCTS: Gallbladder is unremarkable. No biliary ductal dilatation. SPLEEN: The spleen is unremarkable. PANCREAS: The pancreas is unremarkable. ADRENAL GLANDS: Bilateral adrenal glands demonstrate no acute abnormality. KIDNEYS, URETERS AND BLADDER: No stones in the kidneys or ureters. No hydronephrosis. No perinephric or periureteral stranding. Foley catheter in the bladder, which is decompressed. GI AND BOWEL: NG tube  in the stomach. Stomach and duodenal sweep demonstrate no acute abnormality. Normal appendix. There is no bowel obstruction. No abnormal bowel wall thickening or distension. REPRODUCTIVE: Reproductive organs are unremarkable. PERITONEUM AND RETROPERITONEUM: 6.5 x 6.2 cm presacral perirectal fluid collection noted. This was present in 2012 when this measured 6.3 x 4.1 cm. The very slow growth over 13 years suggests a benign process. Moderate-sized umbilical hernia containing fat and a small amount of fluid. This has enlarged since 2012. No ascites or free air. LYMPH NODES: No lymphadenopathy. BONES AND SOFT TISSUES: No acute abnormality of the bones. No acute soft tissue abnormality. IMPRESSION: 1. No active GI bleeding. 2. No occlusion or hemodynamically significant stenosis of the abdominal  or pelvic arterial system. No abdominal aortic aneurysm or dissection. 3. Presacral perirectal 6.5 x 6.2 cm fluid collection, minimally changed since 2012 and likely benign. 4. Moderate-sized umbilical hernia containing fat and a small amount of fluid, increased in size since 2012. Electronically signed by: Franky Crease MD 06/15/2024 11:42 PM EST RP Workstation: [id number]   CT HEAD WO CONTRAST ( ) Result Date: 06/15/2024 EXAM: CT HEAD WITHOUT CONTRAST 06/15/2024 11:23:23 PM TECHNIQUE: CT of the head was performed without the administration of intravenous contrast. Automated exposure control, iterative reconstruction, and/or weight based adjustment of the mA/kV was utilized to reduce the radiation dose to as low as reasonably achievable. COMPARISON: 09/01/2015. CLINICAL HISTORY: Mental status change, unknown cause. FINDINGS: BRAIN AND VENTRICLES: No acute hemorrhage. No evidence of acute infarct. No hydrocephalus. No extra-axial collection. No mass effect or midline shift. ORBITS: Soft tissue swelling along the lateral aspect of the left orbit. SINUSES: Mucosal thickening in ethmoid air cells. SOFT TISSUES AND SKULL:  Partially visualized enteric tube. No acute soft tissue abnormality. No skull fracture. IMPRESSION: 1. No acute intracranial abnormality. 2. Soft tissue swelling along the lateral aspect of the left orbit. 3. Mucosal thickening in ethmoid air cells. Electronically signed by: Donnice Mania MD 06/15/2024 11:33 PM EST RP Workstation: [id number]   DG Abd Portable 1 View Result Date: 06/15/2024 EXAM: 1 VIEW XRAY OF THE ABDOMEN 06/15/2024 09:23:00 PM COMPARISON: None available. CLINICAL HISTORY: OB tube insertion. FINDINGS: LIMITATIONS: Limited field of view for tube placement verification purposes. LINES, TUBES AND DEVICES: An enteric tube has been placed with the tip projecting over the upper mid-abdomen, consistent with its location in the upper stomach. BOWEL: Nonobstructive bowel gas pattern. SOFT TISSUES: No opaque urinary calculi. BONES: No acute osseous abnormality. LUNGS: Infiltrates demonstrated in the left lung base. IMPRESSION: 1. Enteric tube tip projects over the upper mid-abdomen, consistent with its location in the upper stomach. 2. Left basilar pulmonary infiltrates. Electronically signed by: Elsie Gravely MD 06/15/2024 09:27 PM EST RP Workstation: [id number]   DG Chest Port 1 View Result Date: 06/15/2024 EXAM: 1 VIEW(S) XRAY OF THE CHEST 06/15/2024 09:23:00 PM COMPARISON: Comparison with 06/15/2024. CLINICAL HISTORY: [id number] Encounter for intubation [id number] Encounter for intubation. FINDINGS: LINES, TUBES AND DEVICES: Interval placement of an endotracheal tube with the tip measuring 3.1 cm above the carina. An enteric tube has been placed. The tip is off the field of view but below the left hemidiaphragm. LUNGS AND PLEURA: Shallow inspiration. Airspace infiltrates in the right perihilar region and throughout the left lung, similar to the prior study. This may represent pneumonia, edema, or aspiration. No pleural effusion. No pneumothorax. HEART AND MEDIASTINUM: Cardiac enlargement. Calcification  of the aorta. BONES AND SOFT TISSUES: No acute osseous abnormality. IMPRESSION: 1. Interval placement of an endotracheal tube with the tip measuring 3.1 cm above the carina and an enteric tube with the tip off the field of view but below the left hemidiaphragm. 2. Airspace infiltrates in the right perihilar region and throughout the left lung, similar to the prior study, which may represent pneumonia, edema, or aspiration. 3. Cardiac enlargement. Electronically signed by: Elsie Gravely MD 06/15/2024 09:26 PM EST RP Workstation: [id number]   DG Chest Portable 1 View Result Date: 06/15/2024 CLINICAL DATA:  Cough, hematemesis EXAM: PORTABLE CHEST 1 VIEW COMPARISON:  08/28/2021 FINDINGS: Single frontal view of the chest demonstrates an enlarged cardiac silhouette. There is left perihilar airspace disease, consistent with hemorrhage, asymmetric edema, or infection. No effusion or pneumothorax. The right chest is clear.  IMPRESSION: 1. Left perihilar airspace disease consistent with hemorrhage, infection, or asymmetric edema. 2. Enlarged cardiac silhouette. Electronically Signed   By: Ozell Daring M.D.   On: 06/15/2024 20:37      Impression / Plan:   Assessment: Principal Problem:   GIB (gastrointestinal bleeding) Active Problems:   Septic shock (HCC)   [name] is a 73 y.o. y/o female with with hematemesis and a stable hemoglobin with what appears to be aspiration and resulting in intubation admission to the ICU.   Plan:  The patient will be set up for an EGD for today.  The patient's daughter was spoken to about risks benefits and alternatives of this procedure and she agrees towards proceeding with an upper endoscopy since it would be safer to do it while she is intubated opposed to waiting until she gets extubated and risk aspiration again.  The family agrees with the plan and she will be set up for an EGD for today.  Thank you for involving me in the care of this patient.      LOS:  1 day   Sue Copping, MD, MD. NOLIA 06/16/2024, 1:14 PM,  Pager [phone] 7am-5pm  Check AMION for 5pm -7am coverage and on weekends   Note: This dictation was prepared with Dragon dictation along with smaller phrase technology. Any transcriptional errors that result from this process are unintentional.

## 2024-06-16 NOTE — Consult Note (Signed)
 PHARMACY CONSULT NOTE - ELECTROLYTES  Pharmacy Consult for Electrolyte Monitoring and Replacement   Recent Labs: Height: 5' 3 (160 cm) Weight: (!) 138.4 kg (305 lb 1.9 oz) IBW/kg (Calculated) : 52.4 Estimated Creatinine Clearance: 69 mL/min (A) (by C-G formula based on SCr of 1.01 mg/dL (H)). Potassium (mmol/L)  Date Value  06/16/2024 3.6   Magnesium  (mg/dL)  Date Value  88/74/7974 1.8   Calcium (mg/dL)  Date Value  88/74/7974 8.5 (L)   Albumin (g/dL)  Date Value  88/75/7974 4.4  10/25/2021 4.2   Phosphorus (mg/dL)  Date Value  88/74/7974 2.8   Sodium (mmol/L)  Date Value  06/16/2024 137  10/25/2021 144    Assessment  [name] is a 73 y.o. female presenting with hematemesis. PMH significant for pulmonary hypertension, thrombocytopenia, hypertension, CAD, polyneuropathy, atrial fibrillation, cardiomyopathy. Pharmacy has been consulted to monitor and replace electrolytes.  Diet: NPO, intubated MIVF: N/A Pertinent medications: norepinephrine  gtt, phenylephrine  gtt, vasopressin  gtt  Goal of Therapy: Electrolytes WNL  Plan:  No replacement currently warranted Check BMP, Mg, Phos with AM labs  Thank you for allowing pharmacy to be a part of this patient's care.  Devan Danzer A Pierre Dellarocco, PharmD Clinical Pharmacist 06/16/2024 7:37 AM

## 2024-06-17 ENCOUNTER — Encounter: Payer: Self-pay | Admitting: Gastroenterology

## 2024-06-17 ENCOUNTER — Inpatient Hospital Stay

## 2024-06-17 ENCOUNTER — Other Ambulatory Visit (HOSPITAL_COMMUNITY): Payer: Self-pay

## 2024-06-17 ENCOUNTER — Telehealth (HOSPITAL_COMMUNITY): Payer: Self-pay

## 2024-06-17 LAB — LEGIONELLA PNEUMOPHILA SEROGP 1 UR AG: L. pneumophila Serogp 1 Ur Ag: NEGATIVE

## 2024-06-17 LAB — RESPIRATORY PANEL BY PCR

## 2024-06-17 LAB — CBC
HCT: 33.2 % — ABNORMAL LOW (ref 36.0–46.0)
Hemoglobin: 11 g/dL — ABNORMAL LOW (ref 12.0–15.0)
MCH: 31.3 pg (ref 26.0–34.0)
MCHC: 33.1 g/dL (ref 30.0–36.0)
MCV: 94.3 fL (ref 80.0–100.0)
Platelets: 108 K/uL — ABNORMAL LOW (ref 150–400)
RBC: 3.52 MIL/uL — ABNORMAL LOW (ref 3.87–5.11)
RDW: 14.4 % (ref 11.5–15.5)
WBC: 24.9 K/uL — ABNORMAL HIGH (ref 4.0–10.5)
nRBC: 0 % (ref 0.0–0.2)

## 2024-06-17 LAB — RENAL FUNCTION PANEL
Albumin: 3.4 g/dL — ABNORMAL LOW (ref 3.5–5.0)
Anion gap: 13 (ref 5–15)
BUN: 26 mg/dL — ABNORMAL HIGH (ref 8–23)
CO2: 20 mmol/L — ABNORMAL LOW (ref 22–32)
Calcium: 7.7 mg/dL — ABNORMAL LOW (ref 8.9–10.3)
Chloride: 101 mmol/L (ref 98–111)
Creatinine, Ser: 1.22 mg/dL — ABNORMAL HIGH (ref 0.44–1.00)
GFR, Estimated: 47 mL/min — ABNORMAL LOW (ref >60)
Glucose, Bld: 132 mg/dL — ABNORMAL HIGH (ref 70–99)
Phosphorus: 4.5 mg/dL (ref 2.5–4.6)
Potassium: 3.9 mmol/L (ref 3.5–5.1)
Sodium: 134 mmol/L — ABNORMAL LOW (ref 135–145)

## 2024-06-17 LAB — GLUCOSE, CAPILLARY
Glucose-Capillary: 123 mg/dL — ABNORMAL HIGH (ref 70–99)
Glucose-Capillary: 114 mg/dL — ABNORMAL HIGH (ref 70–99)
Glucose-Capillary: 114 mg/dL — ABNORMAL HIGH (ref 70–99)
Glucose-Capillary: 113 mg/dL — ABNORMAL HIGH (ref 70–99)
Glucose-Capillary: 133 mg/dL — ABNORMAL HIGH (ref 70–99)
Glucose-Capillary: 118 mg/dL — ABNORMAL HIGH (ref 70–99)

## 2024-06-17 LAB — THYROID PANEL WITH TSH
Free Thyroxine Index: 1.6 (ref 1.2–4.9)
T3 Uptake Ratio: 25 % (ref 24–39)
T4, Total: 6.2 ug/dL (ref 4.5–12.0)
TSH: 6.38 u[IU]/mL — ABNORMAL HIGH (ref 0.450–4.500)

## 2024-06-17 LAB — MAGNESIUM: Magnesium: 1.6 mg/dL — ABNORMAL LOW (ref 1.7–2.4)

## 2024-06-17 LAB — LACTIC ACID, PLASMA: Lactic Acid, Venous: 1.9 mmol/L (ref 0.5–1.9)

## 2024-06-17 LAB — PROCALCITONIN: Procalcitonin: 24.1 ng/mL

## 2024-06-17 MED ORDER — MAGNESIUM SULFATE 2 GM/50ML IV SOLN
2.0000 g | Freq: Once | INTRAVENOUS | Status: AC
  Administered 2024-06-17: 2 g via INTRAVENOUS
  Filled 2024-06-17: qty 50

## 2024-06-17 MED ORDER — FUROSEMIDE 10 MG/ML IJ SOLN
60.0000 mg | Freq: Once | INTRAMUSCULAR | Status: AC
  Administered 2024-06-17: 60 mg via INTRAVENOUS
  Filled 2024-06-17: qty 6

## 2024-06-17 NOTE — Progress Notes (Addendum)
 Initial Nutrition Assessment  DOCUMENTATION CODES:   Morbid obesity  INTERVENTION:   If patient does not extubate:   Vital HP @25ml /hr continuous + ProSource TF 20- Give 60ml QID via tube, each supplement provides 80kcal and 20g of protein.   Propofol : 23 ml/hr- provides 607kcal/day   Free water flushes 30ml q4 hours to maintain tube patency   Regimen provides 1527kcal/day, 133g/day protein and 632ml/day of free water.   Pt at high refeed risk; recommend monitor potassium, magnesium  and phosphorus labs daily until stable  MVI daily via tube   Daily weights   NUTRITION DIAGNOSIS:   Inadequate oral intake related to inability to eat (pt sedated and ventilated) as evidenced by NPO status.  GOAL:   Provide needs based on ASPEN/SCCM guidelines  MONITOR:   Vent status, Labs, Weight trends, I & O's, Skin, TF tolerance  REASON FOR ASSESSMENT:   Ventilator    ASSESSMENT:   73 y/o female with h/o morbid obesity , chronic pain, Afib and CHF who is admitted with PNA, septic shock and GIB secondary to [facility] Weiss tear.  RD working remotely.  Pt sedated and ventilated. OGT in place. Will plan to initiate tube feeds if pt does not extubate. Pt is likely at refeed risk. Per chart, pt appears weight stable at baseline. Pt is up ~9lbs since admit but appears to be back at her UBW. RD will obtain exam at follow up.   Medications reviewed and include: solu-cortef , protonix , unasyn , doxycycline , levophed , propofol , vasopressin    Labs reviewed: Na 134(L), K 3.9 wnl, BUN 26(H), creat 1.22(H), P 4.5 wnl, Mg 1.6(L) Pro BNP- 5289.0(H)- 11/25 Wbc- 24.9(H) Cbgs- 114, 114, 123 x 24 hrs   Patient is currently intubated on ventilator support MV: 9.2 L/min Temp (24hrs), Avg:98.7 F (37.1 C), Min:97.5 F (36.4 C), Max:99.5 F (37.5 C)  Propofol : 23 ml/hr- provides 607kcal/day   MAP >78mmHg   UOP-   NUTRITION - FOCUSED PHYSICAL EXAM: Unable to perform at this time   Diet  Order:   Diet Order             Diet NPO time specified  Diet effective now                  EDUCATION NEEDS:   No education needs have been identified at this time  Skin:  Skin Assessment: Reviewed RN Assessment  Last BM:  pta  Height:   Ht Readings from Last 1 Encounters:  06/15/24 5' 3 (1.6 m)    Weight:   Wt Readings from Last 1 Encounters:  06/17/24 (!) 139.3 kg    Ideal Body Weight:  52.2 kg  BMI:  Body mass index is 54.4 kg/m.  Estimated Nutritional Needs:   Kcal:  1150-1306kcal/day  Protein:  115-130g/day  Fluid:  1.6-1.8L/day  Sue Shams MS, RD, [name] If unable to be reached, please send secure chat to RD inpatient available from 8:00a-4:00p daily

## 2024-06-17 NOTE — Consult Note (Signed)
 PHARMACY CONSULT NOTE - ELECTROLYTES  Pharmacy Consult for Electrolyte Monitoring and Replacement   Recent Labs: Height: 5' 3 (160 cm) Weight: (!) 139.3 kg (307 lb 1.6 oz) IBW/kg (Calculated) : 52.4 Estimated Creatinine Clearance: 57.4 mL/min (A) (by C-G formula based on SCr of 1.22 mg/dL (H)). Potassium (mmol/L)  Date Value  06/17/2024 3.9   Magnesium  (mg/dL)  Date Value  88/73/7974 1.6 (L)   Calcium (mg/dL)  Date Value  88/73/7974 7.7 (L)   Albumin (g/dL)  Date Value  88/73/7974 3.4 (L)  10/25/2021 4.2   Phosphorus (mg/dL)  Date Value  88/73/7974 4.5   Sodium (mmol/L)  Date Value  06/17/2024 134 (L)  10/25/2021 144    Assessment  [name] is a 73 y.o. female presenting with hematemesis. PMH significant for pulmonary hypertension, thrombocytopenia, hypertension, CAD, polyneuropathy, atrial fibrillation, cardiomyopathy. Pharmacy has been consulted to monitor and replace electrolytes.  Diet: NPO, intubated MIVF: N/A Pertinent medications: norepinephrine  gtt, phenylephrine  gtt, vasopressin  gtt  Goal of Therapy: Electrolytes WNL  Plan:  Mag 1.6: Magnesium  sulfate 2g IV x 1 ordered by CCNP Check BMP, Mg, Phos with AM labs  Thank you for allowing pharmacy to be a part of this patient's care.  Taejon Irani A Oree Mirelez, PharmD Clinical Pharmacist 06/17/2024 7:31 AM

## 2024-06-17 NOTE — Care Management Important Message (Signed)
 Important Message  Patient Details  Name: [name] MRN: [id number] Date of Birth: [birth year]   Important Message Given:  Yes - Medicare IM     Rojelio [name] 06/17/2024, 4:39 PM

## 2024-06-17 NOTE — Telephone Encounter (Signed)
 Pharmacy Patient Advocate Encounter  Insurance verification completed.    The patient is insured through [facility]. Patient has Medicare and is not eligible for a copay card, but may be able to apply for patient assistance or Medicare RX Payment Plan (Patient Must reach out to their plan, if eligible for payment plan), if available.    Ran test claim for Eliquis  5mg  and the current 30 day co-pay is $252.34 - due to deductible, will be $47 once its met.   This test claim was processed through [facility]- copay amounts may vary at other pharmacies due to pharmacy/plan contracts, or as the patient moves through the different stages of their insurance plan.

## 2024-06-17 NOTE — Progress Notes (Signed)
 Patient tolerated spontaneous weaning trail- for approximately 2 hrs. Per Kasa-patient re-sedated until family is able to be here for weaning trial tomorrow.

## 2024-06-17 NOTE — Progress Notes (Signed)
 [facility] CLINIC CARDIOLOGY PROGRESS NOTE       Patient ID: [name] MRN: [id number] DOB/AGE: [birth year] 73 y.o.  Admit date: 06/15/2024 Referring Physician Inge Lecher, NP (PCCM) Primary Physician Odell Chard, Edra GRADE, MD  Primary Cardiologist [facility] Reason for Consultation AF RVR  HPI: [name] is a 73 y.o. female  with a past medical history of chronic atrial fibrillation noncompliant with Eliquis , migraines who presented to the ED on 06/15/2024 for nausea, hematemesis.  Earlier today developed rapid A-fib.  Cardiology was consulted for further evaluation.   Interval history: - Patient seen and examined morning, remains intubated and sedated. - Heart rate overall improved compared to yesterday on Amio infusion. - Vasopressor requirement down to levo and vasopressin .  Review of systems complete and found to be negative unless listed above    Past Medical History:  Diagnosis Date   History of shingles    Knee pain    Migraines     Past Surgical History:  Procedure Laterality Date   COLONOSCOPY WITH PROPOFOL  N/A 02/19/2020   Procedure: COLONOSCOPY WITH PROPOFOL ;  Surgeon: Janalyn Keene NOVAK, MD;  Location: [facility] ENDOSCOPY;  Service: Endoscopy;  Laterality: N/A;   REPLACEMENT TOTAL KNEE     left knee   TUBAL LIGATION      Medications Prior to Admission  Medication Sig Dispense Refill Last Dose/Taking   pregabalin (LYRICA) 50 MG capsule Take 1 capsule by mouth 2 (two) times daily.   Taking   Acetaminophen  (TYLENOL  8 HOUR PO) Take by mouth as needed.      albuterol  (VENTOLIN  HFA) 108 (90 Base) MCG/ACT inhaler Inhale 1-2 puffs into the lungs every 6 (six) hours as needed for wheezing or shortness of breath. 18 g 1    apixaban  (ELIQUIS ) 5 MG TABS tablet Take 1 tablet (5 mg total) by mouth 2 (two) times daily. 180 tablet 1    gabapentin  (NEURONTIN ) 600 MG tablet Take 1 tablet (600 mg total) by mouth 3 (three) times daily as needed. (Patient not taking: Reported  on 06/16/2024) 270 tablet 1 Not Taking   metoprolol  succinate (TOPROL -XL) 100 MG 24 hr tablet Take 1 tablet (100 mg total) by mouth in the morning and at bedtime. Take with or immediately following a meal. 180 tablet 1    ondansetron  (ZOFRAN ) 4 MG tablet Take 1 tablet (4 mg total) by mouth every 8 (eight) hours as needed for nausea or vomiting. 20 tablet 0    Spacer/Aero-Holding Chambers (OPTICHAMBER DIAMOND) MISC by Does not apply route.      torsemide  (DEMADEX ) 20 MG tablet TAKE 1 TABLET BY MOUTH EVERY DAY (Patient not taking: Reported on 04/12/2022) 90 tablet 0    Social History   Socioeconomic History   Marital status: Widowed    Spouse name: Not on file   Number of children: Not on file   Years of education: Not on file   Highest education level: Not on file  Occupational History   Occupation: retired  Tobacco Use   Smoking status: Never   Smokeless tobacco: Never  Vaping Use   Vaping status: Never Used  Substance and Sexual Activity   Alcohol use: No   Drug use: No   Sexual activity: Not Currently  Other Topics Concern   Not on file  Social History Narrative   Not on file   Social Drivers of Health   Financial Resource Strain: Low Risk  (04/12/2022)   Overall Financial Resource Strain (CARDIA)    Difficulty  of Paying Living Expenses: Not hard at all  Food Insecurity: No Food Insecurity (04/12/2022)   Hunger Vital Sign    Worried About Running Out of Food in the Last Year: Never true    Ran Out of Food in the Last Year: Never true  Transportation Needs: No Transportation Needs (04/12/2022)   PRAPARE - Administrator, Civil Service (Medical): No    Lack of Transportation (Non-Medical): No  Physical Activity: Inactive (04/12/2022)   Exercise Vital Sign    Days of Exercise per Week: 0 days    Minutes of Exercise per Session: 0 min  Stress: No Stress Concern Present (04/12/2022)   [employer] of Occupational Health - Occupational Stress Questionnaire     Feeling of Stress : Only a little  Social Connections: Socially Isolated (04/12/2022)   Social Connection and Isolation Panel    Frequency of Communication with Friends and Family: More than three times a week    Frequency of Social Gatherings with Friends and Family: Once a week    Attends Religious Services: Never    Database Administrator or Organizations: No    Attends Banker Meetings: Never    Marital Status: Widowed  Intimate Partner Violence: Not At Risk (04/12/2022)   Humiliation, Afraid, Rape, and Kick questionnaire    Fear of Current or Ex-Partner: No    Emotionally Abused: No    Physically Abused: No    Sexually Abused: No    Family History  Problem Relation Age of Onset   Heart disease Mother    Hypertension Mother    Cancer Father    Cancer Brother      Vitals:   06/17/24 0630 06/17/24 0645 06/17/24 0700 06/17/24 0715  BP:      Pulse: 79 88 94 88  Resp: 16 19 16 19   Temp: 98.8 F (37.1 C) 98.8 F (37.1 C) 98.8 F (37.1 C) 98.6 F (37 C)  TempSrc:      SpO2: 100% 100% 99% 100%  Weight:      Height:        PHYSICAL EXAM General: Ill-appearing female, intubated and sedated. HEENT: Normocephalic and atraumatic. Neck: No JVD.  Lungs: Mechanical breath sounds, coarse bilaterally Heart: Irregularly irregular, borderline rate. Normal S1 and S2 without gallops or murmurs.  Abdomen: Non-distended appearing.  Msk: Normal strength and tone for age. Extremities: Warm and well perfused. No clubbing, cyanosis.  Trace edema.   Labs: Basic Metabolic Panel: Recent Labs    06/16/24 0440 06/17/24 0330  NA 137 134*  K 3.6 3.9  CL 100 101  CO2 25 20*  GLUCOSE 95 132*  BUN 16 26*  CREATININE 1.01* 1.22*  CALCIUM 8.5* 7.7*  MG 1.8 1.6*  PHOS 2.8 4.5   Liver Function Tests: Recent Labs    06/15/24 2005 06/17/24 0330  AST 23  --   ALT 6  --   ALKPHOS 76  --   BILITOT 1.4*  --   PROT 7.1  --   ALBUMIN 4.4 3.4*   Recent Labs     06/15/24 2005  LIPASE 18   CBC: Recent Labs    06/16/24 0944 06/17/24 0330  WBC 22.2* 24.9*  NEUTROABS 16.5*  --   HGB 11.2* 11.0*  HCT 34.3* 33.2*  MCV 95.0 94.3  PLT 119* 108*   Cardiac Enzymes: No results for input(s): CKTOTAL, CKMB, CKMBINDEX, TROPONINIHS in the last 72 hours. BNP: No results for input(s): BNP in the  last 72 hours. D-Dimer: No results for input(s): DDIMER in the last 72 hours. Hemoglobin A1C: No results for input(s): HGBA1C in the last 72 hours. Fasting Lipid Panel: Recent Labs    06/16/24 0440  TRIG 70   Thyroid  Function Tests: No results for input(s): TSH, T4TOTAL, T3FREE, THYROIDAB in the last 72 hours.  Invalid input(s): FREET3 Anemia Panel: No results for input(s): VITAMINB12, FOLATE, FERRITIN, TIBC, IRON, RETICCTPCT in the last 72 hours.   Radiology: [facility] Chest Port 1 View Result Date: 06/16/2024 CLINICAL DATA:  Orogastric tube placement. EXAM: PORTABLE CHEST 1 VIEW COMPARISON:  Radiographs 06/16/2024 and 06/15/2024.  CT 06/15/2024. FINDINGS: 1500 hours. Two views submitted. The carina is not well visualized. Endotracheal tube appears unchanged, proximally 1.1 cm above the carina. Enteric tube projects below the diaphragm with tip overlying the mid stomach. A left IJ central venous catheter projects over the lower SVC. Lower lung volumes with increasing diffuse bilateral airspace opacities, most consistent with worsening pulmonary edema. Possible small bilateral pleural effusions. No evidence of pneumothorax. Grossly stable heart size and mediastinal contours. There are aortic calcifications. IMPRESSION: 1. Enteric tube tip overlies the mid stomach. 2. Lower lung volumes with increasing diffuse bilateral airspace opacities, most consistent with worsening pulmonary edema. Electronically Signed   By: Elsie Perone M.D.   On: 06/16/2024 16:09   ECHOCARDIOGRAM COMPLETE Result Date: 06/16/2024    ECHOCARDIOGRAM  REPORT   Patient Name:   RONALDA WALPOLE Date of Exam: 06/16/2024 Medical Rec #:  [id number]       Height:       63.0 in Accession #:    [id number]      Weight:       305.1 lb Date of Birth:  27-Oct-1950       BSA:          2.314 m Patient Age:    73 years        BP:           107/64 mmHg Patient Gender: F               HR:           133 bpm. Exam Location:  [facility] Procedure: 2D Echo, Cardiac Doppler and Color Doppler (Both Spectral and Color            Flow Doppler were utilized during procedure). Indications:     Cardiomyopathy-Unspecified I42.9  History:         Patient has prior history of Echocardiogram examinations, most                  recent 10/23/2021. Cardiomyopathy; Arrythmias:Atrial                  Fibrillation.  Sonographer:     Ashley McNeely-Sloane Referring Phys:  [id number] BRITTON L RUST-CHESTER Diagnosing Phys: Annalee Custovic IMPRESSIONS  1. Left ventricular ejection fraction, by estimation, is 60 to 65%. The left ventricle has normal function. The left ventricle has no regional wall motion abnormalities. Left ventricular diastolic parameters are consistent with Grade II diastolic dysfunction (pseudonormalization).  2. Right ventricular systolic function is normal. The right ventricular size is normal. There is moderately elevated pulmonary artery systolic pressure. The estimated right ventricular systolic pressure is 55.4 mmHg.  3. Left atrial size was moderately dilated.  4. Right atrial size was mildly dilated.  5. The mitral valve is normal in structure. Mild mitral valve regurgitation. No evidence of mitral stenosis.  6. Tricuspid valve regurgitation  is moderate.  7. The aortic valve is normal in structure. Aortic valve regurgitation is moderate. No aortic stenosis is present.  8. The inferior vena cava is normal in size with greater than 50% respiratory variability, suggesting right atrial pressure of 3 mmHg. FINDINGS  Left Ventricle: Left ventricular ejection fraction, by estimation, is 60 to  65%. The left ventricle has normal function. The left ventricle has no regional wall motion abnormalities. The left ventricular internal cavity size was normal in size. There is  no left ventricular hypertrophy. Left ventricular diastolic parameters are consistent with Grade II diastolic dysfunction (pseudonormalization). Right Ventricle: The right ventricular size is normal. No increase in right ventricular wall thickness. Right ventricular systolic function is normal. There is moderately elevated pulmonary artery systolic pressure. The tricuspid regurgitant velocity is 3.18 m/s, and with an assumed right atrial pressure of 15 mmHg, the estimated right ventricular systolic pressure is 55.4 mmHg. Left Atrium: Left atrial size was moderately dilated. Right Atrium: Right atrial size was mildly dilated. Pericardium: There is no evidence of pericardial effusion. Mitral Valve: The mitral valve is normal in structure. Mild mitral valve regurgitation. No evidence of mitral valve stenosis. MV peak gradient, 11.6 mmHg. The mean mitral valve gradient is 4.0 mmHg. Tricuspid Valve: The tricuspid valve is normal in structure. Tricuspid valve regurgitation is moderate. Aortic Valve: The aortic valve is normal in structure. Aortic valve regurgitation is moderate. Aortic regurgitation PHT measures 202 msec. No aortic stenosis is present. Aortic valve mean gradient measures 4.0 mmHg. Aortic valve peak gradient measures 6.7 mmHg. Aortic valve area, by VTI measures 2.82 cm. Pulmonic Valve: The pulmonic valve was normal in structure. Pulmonic valve regurgitation is not visualized. Aorta: The aortic root is normal in size and structure. Venous: The inferior vena cava is normal in size with greater than 50% respiratory variability, suggesting right atrial pressure of 3 mmHg. IAS/Shunts: No atrial level shunt detected by color flow Doppler.  LEFT VENTRICLE PLAX 2D LVIDd:         4.50 cm     Diastology LVIDs:         3.20 cm     LV e'  medial:    7.62 cm/s LV PW:         1.20 cm     LV E/e' medial:  14.4 LV IVS:        1.30 cm     LV e' lateral:   8.27 cm/s LVOT diam:     2.10 cm     LV E/e' lateral: 13.3 LV SV:         47 LV SV Index:   20 LVOT Area:     3.46 cm LV IVRT:       56 msec  LV Volumes (MOD) LV vol d, MOD A2C: 36.7 ml LV vol d, MOD A4C: 69.6 ml LV vol s, MOD A2C: 19.6 ml LV vol s, MOD A4C: 22.5 ml LV SV MOD A2C:     17.1 ml LV SV MOD A4C:     69.6 ml LV SV MOD BP:      29.3 ml RIGHT VENTRICLE             IVC RV Basal diam:  4.20 cm     IVC diam: 2.70 cm RV Mid diam:    4.20 cm RV S prime:     12.50 cm/s TAPSE (M-mode): 1.4 cm LEFT ATRIUM             Index  RIGHT ATRIUM           Index LA diam:        4.70 cm 2.03 cm/m   RA Area:     24.00 cm LA Vol (A2C):   79.1 ml 34.19 ml/m  RA Volume:   71.00 ml  30.68 ml/m LA Vol (A4C):   91.7 ml 39.63 ml/m LA Biplane Vol: 90.4 ml 39.07 ml/m  AORTIC VALVE                    PULMONIC VALVE AV Area (Vmax):    2.74 cm     PV Vmax:        0.83 m/s AV Area (Vmean):   2.76 cm     PV Vmean:       58.700 cm/s AV Area (VTI):     2.82 cm     PV VTI:         0.121 m AV Vmax:           129.00 cm/s  PV Peak grad:   2.8 mmHg AV Vmean:          89.400 cm/s  PV Mean grad:   2.0 mmHg AV VTI:            0.166 m      RVOT Peak grad: 2 mmHg AV Peak Grad:      6.7 mmHg AV Mean Grad:      4.0 mmHg LVOT Vmax:         102.00 cm/s LVOT Vmean:        71.300 cm/s LVOT VTI:          0.135 m LVOT/AV VTI ratio: 0.81 AI PHT:            202 msec  AORTA Ao Root diam: 3.40 cm Ao Asc diam:  3.50 cm MITRAL VALVE                TRICUSPID VALVE MV Area (PHT): 4.71 cm     TR Peak grad:   40.4 mmHg MV Area VTI:   1.80 cm     TR Mean grad:   21.0 mmHg MV Peak grad:  11.6 mmHg    TR Vmax:        318.00 cm/s MV Mean grad:  4.0 mmHg     TR Vmean:       225.0 cm/s MV Vmax:       1.70 m/s MV Vmean:      90.2 cm/s    SHUNTS MV Decel Time: 161 msec     Systemic VTI:  0.14 m MV E velocity: 110.00 cm/s  Systemic Diam: 2.10 cm                              Pulmonic VTI:  0.095 m Annalee Custovic Electronically signed by Annalee Casa Signature Date/Time: 06/16/2024/3:15:43 PM    Final    DG Chest 1 View Result Date: 06/16/2024 EXAM: 1 VIEW(S) XRAY OF THE CHEST 06/16/2024 09:48:00 AM COMPARISON: 06/15/2024 CLINICAL HISTORY: Encounter for central line placement [id number] FINDINGS: LINES, TUBES AND DEVICES: Endotracheal tube in place with tip 1.3 cm above the carina. Left internal jugular central venous catheter terminates in the region of the distal superior vena cava. Enteric tube extends into the stomach. LUNGS AND PLEURA: Mild pulmonary edema. Stable bilateral airspace opacities. Small left pleural effusion. Low lung volumes. No pneumothorax. HEART AND MEDIASTINUM: Persistent  cardiomegaly. Aortic atherosclerosis. BONES AND SOFT TISSUES: No acute osseous abnormality. IMPRESSION: 1. Mild pulmonary edema and stable bilateral airspace opacities. 2. Small left pleural effusion. 3. Persistent cardiomegaly and aortic atherosclerosis. Electronically signed by: Evalene Coho MD 06/16/2024 10:13 AM EST RP Workstation: [id number]   CT CHEST WO CONTRAST Result Date: 06/15/2024 EXAM: CT CHEST WITHOUT CONTRAST 06/15/2024 11:23:23 PM TECHNIQUE: CT of the chest was performed without the administration of intravenous contrast. Multiplanar reformatted images are provided for review. Automated exposure control, iterative reconstruction, and/or weight based adjustment of the mA/kV was utilized to reduce the radiation dose to as low as reasonably achievable. COMPARISON: 05/27/2021. CLINICAL HISTORY: Aspiration. FINDINGS: MEDIASTINUM: There was a ball in the middle of the esophagus. Cardiomegaly. Coronary artery and aortic atherosclerosis. Endotracheal tube tip is in the lower trachea above the carina. Pericardium is unremarkable. LYMPH NODES: No mediastinal, hilar or axillary lymphadenopathy. LUNGS AND PLEURA: Extensive bilateral airspace disease, most  pronounced throughout the left lung and in the right lower lobe. Findings concerning for pneumonia. Trace bilateral pleural effusions. No pneumothorax. SOFT TISSUES/BONES: No acute abnormality of the bones or soft tissues. UPPER ABDOMEN: Limited images of the upper abdomen demonstrates no acute abnormality. IMPRESSION: 1. Extensive bilateral airspace disease, most pronounced throughout the left lung and in the right lower lobe, concerning for pneumonia. 2. Trace bilateral pleural effusions. 3. Cardiomegaly, coronary artery disease . 4. Aortic atherosclerosis. Electronically signed by: Franky Crease MD 06/15/2024 11:44 PM EST RP Workstation: [id number]   CT ANGIO GI BLEED Result Date: 06/15/2024 EXAM: CTA ABDOMEN AND PELVIS WITH CONTRAST 06/15/2024 11:23:23 PM TECHNIQUE: CTA images of the abdomen and pelvis with intravenous contrast. 125 mL of iohexol  (OMNIPAQUE ) 350 MG/ML injection was administered. Three-dimensional MIP/volume rendered formations were performed. Automated exposure control, iterative reconstruction, and/or weight based adjustment of the mA/kV was utilized to reduce the radiation dose to as low as reasonably achievable. COMPARISON: Comparison is made to a study from 2012. CLINICAL HISTORY: LUQ abdominal pain; hematemesis, abd pain. FINDINGS: VASCULATURE: GI BLEED: No active extravasation of contrast within the GI tract. AORTA: Aortic atherosclerosis. No acute finding. No abdominal aortic aneurysm. No dissection. CELIAC TRUNK: No acute finding. No occlusion or significant stenosis. SUPERIOR MESENTERIC ARTERY: No acute finding. No occlusion or significant stenosis. INFERIOR MESENTERIC ARTERY: No acute finding. No occlusion or significant stenosis. RENAL ARTERIES: No acute finding. No occlusion or significant stenosis. ILIAC ARTERIES: No acute finding. No occlusion or significant stenosis. ABDOMEN/PELVIS: LOWER CHEST: See chest CT report today. LIVER: The liver is unremarkable. GALLBLADDER AND BILE  DUCTS: Gallbladder is unremarkable. No biliary ductal dilatation. SPLEEN: The spleen is unremarkable. PANCREAS: The pancreas is unremarkable. ADRENAL GLANDS: Bilateral adrenal glands demonstrate no acute abnormality. KIDNEYS, URETERS AND BLADDER: No stones in the kidneys or ureters. No hydronephrosis. No perinephric or periureteral stranding. Foley catheter in the bladder, which is decompressed. GI AND BOWEL: NG tube in the stomach. Stomach and duodenal sweep demonstrate no acute abnormality. Normal appendix. There is no bowel obstruction. No abnormal bowel wall thickening or distension. REPRODUCTIVE: Reproductive organs are unremarkable. PERITONEUM AND RETROPERITONEUM: 6.5 x 6.2 cm presacral perirectal fluid collection noted. This was present in 2012 when this measured 6.3 x 4.1 cm. The very slow growth over 13 years suggests a benign process. Moderate-sized umbilical hernia containing fat and a small amount of fluid. This has enlarged since 2012. No ascites or free air. LYMPH NODES: No lymphadenopathy. BONES AND SOFT TISSUES: No acute abnormality of the bones. No acute soft tissue abnormality. IMPRESSION: 1.  No active GI bleeding. 2. No occlusion or hemodynamically significant stenosis of the abdominal or pelvic arterial system. No abdominal aortic aneurysm or dissection. 3. Presacral perirectal 6.5 x 6.2 cm fluid collection, minimally changed since 2012 and likely benign. 4. Moderate-sized umbilical hernia containing fat and a small amount of fluid, increased in size since 2012. Electronically signed by: Franky Crease MD 06/15/2024 11:42 PM EST RP Workstation: [id number]   CT HEAD WO CONTRAST ( ) Result Date: 06/15/2024 EXAM: CT HEAD WITHOUT CONTRAST 06/15/2024 11:23:23 PM TECHNIQUE: CT of the head was performed without the administration of intravenous contrast. Automated exposure control, iterative reconstruction, and/or weight based adjustment of the mA/kV was utilized to reduce the radiation dose to as  low as reasonably achievable. COMPARISON: 09/01/2015. CLINICAL HISTORY: Mental status change, unknown cause. FINDINGS: BRAIN AND VENTRICLES: No acute hemorrhage. No evidence of acute infarct. No hydrocephalus. No extra-axial collection. No mass effect or midline shift. ORBITS: Soft tissue swelling along the lateral aspect of the left orbit. SINUSES: Mucosal thickening in ethmoid air cells. SOFT TISSUES AND SKULL: Partially visualized enteric tube. No acute soft tissue abnormality. No skull fracture. IMPRESSION: 1. No acute intracranial abnormality. 2. Soft tissue swelling along the lateral aspect of the left orbit. 3. Mucosal thickening in ethmoid air cells. Electronically signed by: Donnice Mania MD 06/15/2024 11:33 PM EST RP Workstation: [id number]   DG Abd Portable 1 View Result Date: 06/15/2024 EXAM: 1 VIEW XRAY OF THE ABDOMEN 06/15/2024 09:23:00 PM COMPARISON: None available. CLINICAL HISTORY: OB tube insertion. FINDINGS: LIMITATIONS: Limited field of view for tube placement verification purposes. LINES, TUBES AND DEVICES: An enteric tube has been placed with the tip projecting over the upper mid-abdomen, consistent with its location in the upper stomach. BOWEL: Nonobstructive bowel gas pattern. SOFT TISSUES: No opaque urinary calculi. BONES: No acute osseous abnormality. LUNGS: Infiltrates demonstrated in the left lung base. IMPRESSION: 1. Enteric tube tip projects over the upper mid-abdomen, consistent with its location in the upper stomach. 2. Left basilar pulmonary infiltrates. Electronically signed by: Elsie Gravely MD 06/15/2024 09:27 PM EST RP Workstation: [id number]   DG Chest Port 1 View Result Date: 06/15/2024 EXAM: 1 VIEW(S) XRAY OF THE CHEST 06/15/2024 09:23:00 PM COMPARISON: Comparison with 06/15/2024. CLINICAL HISTORY: [id number] Encounter for intubation [id number] Encounter for intubation. FINDINGS: LINES, TUBES AND DEVICES: Interval placement of an endotracheal tube with the tip measuring 3.1 cm  above the carina. An enteric tube has been placed. The tip is off the field of view but below the left hemidiaphragm. LUNGS AND PLEURA: Shallow inspiration. Airspace infiltrates in the right perihilar region and throughout the left lung, similar to the prior study. This may represent pneumonia, edema, or aspiration. No pleural effusion. No pneumothorax. HEART AND MEDIASTINUM: Cardiac enlargement. Calcification of the aorta. BONES AND SOFT TISSUES: No acute osseous abnormality. IMPRESSION: 1. Interval placement of an endotracheal tube with the tip measuring 3.1 cm above the carina and an enteric tube with the tip off the field of view but below the left hemidiaphragm. 2. Airspace infiltrates in the right perihilar region and throughout the left lung, similar to the prior study, which may represent pneumonia, edema, or aspiration. 3. Cardiac enlargement. Electronically signed by: Elsie Gravely MD 06/15/2024 09:26 PM EST RP Workstation: [id number]   DG Chest Portable 1 View Result Date: 06/15/2024 CLINICAL DATA:  Cough, hematemesis EXAM: PORTABLE CHEST 1 VIEW COMPARISON:  08/28/2021 FINDINGS: Single frontal view of the chest demonstrates an enlarged cardiac silhouette. There is left perihilar airspace disease, consistent with  hemorrhage, asymmetric edema, or infection. No effusion or pneumothorax. The right chest is clear. IMPRESSION: 1. Left perihilar airspace disease consistent with hemorrhage, infection, or asymmetric edema. 2. Enlarged cardiac silhouette. Electronically Signed   By: Ozell Daring M.D.   On: 06/15/2024 20:37    ECHO as above  TELEMETRY (personally reviewed): Atrial fibrillation rate 120s.  EKG (personally reviewed): Atrial fibrillation RVR rate 127 bpm  Data reviewed by me 06/17/2024: last 24h vitals tele labs imaging I/O ED provider note, admission H&P  Principal Problem:   GIB (gastrointestinal bleeding) Active Problems:   Septic shock (HCC)   Hematemesis   Mallory-Weiss  tear    ASSESSMENT AND PLAN:  [name] is a 73 y.o. female  with a past medical history of chronic atrial fibrillation noncompliant with Eliquis , migraines who presented to the ED on 06/15/2024 for nausea, hematemesis.  Earlier today developed rapid A-fib.  Cardiology was consulted for further evaluation.   # Atrial fibrillation RVR # Chronic atrial fibrillation # Acute hypoxic respiratory failure # Acute HFpEF # GI bleed Patient presented with nausea, hematemesis.  Hemoglobin 12.2 > 11.2.  BNP elevated at 5289.  Chest x-ray concerning for pulmonary edema.  Has known history of atrial fibrillation and overnight into this morning has been noted to have rapid rates.  Given an IV amiodarone  bolus this morning.  Echo this admission with EF 60-65%, no wall motion abnormalities, grade 2 diastolic dysfunction, moderately elevated PASP, mild MR, moderate TR. - Continue IV amiodarone . - Hemoglobin remains relatively stable.  Will ultimately need anticoagulation given the atrial fibrillation. - Upper endoscopy yesterday with Mallory-Weiss tear noted without any evidence of active bleeding and appeared to be healing. - Will defer IV Lasix  as she is requiring multiple IV pressors currently. - Further management of shock, decompensated respiratory status as per PCCM. - Patient is critically ill with multiple comorbidities.  Will continue to follow closely but overall prognosis is guarded.   This patient's plan of care was discussed and created with Dr. Custovic and she is in agreement.  Signed: Danita Bloch, PA-C  06/17/2024, 8:28 AM [facility]

## 2024-06-17 NOTE — Progress Notes (Addendum)
 NAME:  [name], MRN:  [id number], DOB:  Dec 27, 1950, LOS: 2 ADMISSION DATE:  06/15/2024, CONSULTATION DATE:  06/15/24 REFERRING MD:  Dr. Dorothyann  CHIEF COMPLAINT:  Hematemesis   History of Present Illness:  [name] is a 73 year old who presented to [facility] ED from home for evaluation of hematemesis.   History obtained per chart review and family bedside report. Patient was in her normal state of health until 11/21. Family reported she started feeling poorly with a cough, they denied congestion, complaints of dyspnea or chest pain. Then starting on 11/24 the patient began feeling nauseous and having hematemesis. Family denied any fever/ chills or any other complaints. And no previous episodes of hematemesis or GI bleeding that family is aware of. Patient is documented as complaining of abdominal pain, but family denies this. Family reports patient was on Eliquis  due to Chronic A-fib but stopped a year ago due to financial constraints. She had a recent PCP appointment and was expecting to hear from someone to assist her in obtaining medication, but hadn't heard anything yet. Family also reports some non-compliance with medication, that she tries not to take medicine if she doesn't need it. They deny any tobacco use/ ETOH use or recreational drug use history.    ED course: Upon arrival patient had a moderate episode of hematemesis in the waiting room. Initial vitals reveal tachypnea/ tachycardia/ hypertensive and hypoxic. Patient alert but altered requiring NRB to maintain oxygen. Due to hypoxic respiratory failure decision made to intubate the patient and place her on mechanical ventilatory support. No additional episodes of hematemesis. GI on call will follow along, not a candidate for emergent EGD as patient appears to have stabilized. CTa GIB scan ordered. Medications given: etomidate , versed , succinylcholine , morphine , zofran , 40 mg protonix , 1 L NS bolus, cefepime / vancomycin /  flagyl , propofol  drip started Initial Vitals: 98.2, 26, 152, 167/127 & 85% on RA   Significant labs:  EKG Interpretation: Date: 06/15/24, EKG Time: 19:52, Rate: 177, Rhythm: A-fib, QRS Axis:  RAD, Intervals: a-fib, ST/T Wave abnormalities: none (challenging read due to artifact), Narrative Interpretation: A-fib RVR Chemistry: Na+: 138, K+: 4.2, BUN/Cr.: 13/ 0.90, Serum CO2/ AG: 24/15, T.Bili: 1.4 Hematology: WBC: 5.3, Hgb: 12.2,  Troponin: 23, BNP: pending, Lactic/ PCT: 1.5/ pending, COVID-19 & Influenza A/B: pending ABG: 7.42/ 48 / 124/ 31.1   CXR 06/15/24: Interval placement of an endotracheal tube with the tip measuring 3.1 cm above the carina and an enteric tube with the tip off the field of view but below the left hemidiaphragm.2. Airspace infiltrates in the right perihilar region and throughout the left lung, similar to the prior study, which may represent pneumonia, edema, or aspiration. 3. Cardiac enlargement.  CT chest wo contrast 06/15/24: . Extensive bilateral airspace disease, most pronounced throughout the left lung and in the right lower lobe, concerning for pneumonia. 2. Trace bilateral pleural effusions. 3. Cardiomegaly, coronary artery disease. 4. Aortic atherosclerosis.  CT angio GIB protocol 06/15/24:  No active GI bleeding. 2. No occlusion or hemodynamically significant stenosis of the abdominal or pelvic arterial system. No abdominal aortic aneurysm or dissection. 3. Presacral perirectal 6.5 x 6.2 cm fluid collection, minimally changed since 2012 and likely benign. 4. Moderate-sized umbilical hernia containing fat and a small amount of fluid, increased in size since 2012   PCCM consulted for admission due to acute hypoxic respiratory failure s/t suspected aspiration pneumonia in the setting of suspected acute upper GIB on mechanical ventilatory support.  Pertinent Medical  History  Pulmonary Hypertension Thrombocytopenia Hypertension CAD Polyneuropathy PAF - Previously  on Eliquis  but stopped last year d/t financial constraints Cardiomyopathy (ECHO 07/2020 LVEF 60-65%; severe LA dilatation, mild RA dilatation, borderline pulmonary hypertension w/ PASP )  Significant Hospital Events: Including procedures, antibiotic start and stop dates in addition to other pertinent events   06/15/24: Admit to ICU due to acute hypoxic respiratory failure s/t suspected aspiration pneumonia in the setting of suspected acute upper GIB on mechanical ventilatory support. 06/16/24: Remains critically ill requiring mechanical ventilation with increasing pressor requirements. Central line and arterial line placed. GI following; plan for EGD today. 06/17/24: EGD found evidence of Mallory Weiss tear without active bleeding. Remains critically ill requiring mechanical ventilation. Vent requirements decreasing. Pressor requirements coming down. Will plan for WUA today with family at bedside. Cardiology following.  Interim History / Subjective:  See above listed under Significant Hospital Events.  Objective    Blood pressure (!) 140/76, pulse (!) 105, temperature 98.6 F (37 C), resp. rate (!) 24, height 5' 3 (1.6 m), weight (!) 139.3 kg, SpO2 100%. CVP:  [11 mmHg-16 mmHg] 14 mmHg  Vent Mode: PRVC FiO2 (%):  [30 %-40 %] 30 % Set Rate:  [16 bmp] 16 bmp Vt Set:  [420 mL] 420 mL PEEP:  [5 cmH20] 5 cmH20 Plateau Pressure:  [22 cmH20-28 cmH20] 22 cmH20   Intake/Output Summary (Last 24 hours) at 06/17/2024 9082 Last data filed at 06/17/2024 0700 Gross per 24 hour  Intake 2546.03 ml  Output 950 ml  Net 1596.03 ml   Filed Weights   06/15/24 2340 06/16/24 0500 06/17/24 0500  Weight: 135.6 kg (!) 138.4 kg (!) 139.3 kg    Examination: General: critically ill, intubated and sedated in NAD HENT: atraumatic, normocephalic, supple, no jvd Lungs: mildly coarse breath sounds bilaterally, synchronous with the vent Cardiovascular: atrial fibrillation with rvr, S1 S2, no  m/r/g Abdomen: soft, mild distention, non-tender, no rebound/guarding, bowel sounds x 4 Extremities: warm, dry, lower extremity edema, radial pulses 2+, distal pulses 1+ Neuro: following commands on WUA, no focal neuro deficits, PERRL GU: foley catheter draining clear yellow urine  Resolved problem list   Assessment and Plan   #Acute Metabolic Encephalopathy s/t Septic Shock d/t Multifocal Pneumonia 06/15/24 CT Head wo contrast: no acute intracranial abnormality - PAD protocol - RASS goal of -1 - Continue propofol  to maintain RASS goal - PRN Versed  and Fentanyl  - Wean sedatives as able - Daily WUA as able; following commands - Encourage family support at bedside - Maintain appropriate sleep wake cycles  #Chronic/Paroxysmal Atrial Fibrillation with RVR #Septic Shock s/t Aspiration Pneumonia #Acute Decompensated Heart Failure s/t Septic Shock #Mildly Elevated Troponin s/t Demand Ischemia Hx: Hypertension, PAF, Pulmonary Hypertension, Cardiomyopathy 06/16/24 ECHO: LVEF 60-65% with grade II diastolic dysfunction; moderately elevated pulmonary artery systolic pressure; tricuspid regurgitation with right atrial pressure of . - Continuous cardiac monitoring - Vasopressors to maintain MAP >65 ~ weaned off all pressors - Continue amiodarone  gtt - EKG prn - Most recent troponin 39 - BNP 5,289; lasix  60mg  x 1 - Cardiology following; appreciate input  #Acute Hypoxic Respiratory Failure s/t aspiration in the setting of refractory vomiting and Multifocal Pneumonia 06/15/24 CT Chest wo contrast: extensive bilateral airspace disease, mostly throughout left lung and in right lower lobe; concern for pneumonia; trace bilateral pleural effusions; cardiomegaly, CAD. - Full vent support - FiO2 and PEEP to maintain O2 sats >92% - Maintain plateau pressure less than 30 cm H2O - Daily SBT once respiratory  and mental status parameters met - SBT this am - VAP bundle - Ensure pulmonary  hygiene - Duonebs prn - Intermittent CXR and ABG - Repeat CXR - Lactic normal at 1.5 - Procalcitonin 18.20  - Respiratory PCR (flu/covid/rsv): negative  #Mild AKI secondary to Septic Shock - Strict I/O - Trend BMP and monitor renal function - Creatinine trending up at 1.22 this am - Avoid nephrotoxins as able - Ensure renal perfusion - ICU electrolyte replacement protocol - Pharmacy to assist with replacement  #Acute Upper GI Bleed s/t Mallory Weiss Tear ~ Resolved 06/15/24 CTA GI Bleed: no active GI bleed; no occlusion or HD significant stenosis of abdominal or pelvic arterial system. No AAA or dissection.  - Continue PPI: Protonix  40mg  - Constipation protocol prn - Diet: NPO - Antiemetics prn - EGD: evidence of mallory weiss tear without signs of active bleeding - GI signed off  #Acute Blood Loss Anemia s/t Mallory Weiss Tear ~ Resolved #Thrombocytopenia - Trend CBC - Monitor H/H and platelets - Transfuse if Hgb < 7; Hgb trending down 12.2 >> 11.2 - Monitor for s/sx of bleeding - Type and screen - Check coags as indicated - DVT prophylaxis: SCDs; will need AC at some point  #Hyperglycemia w/o dx of Type II Diabetes Mellitus - ICU hypo/hyperglycemia protocol - Range 140-180s - CBG Q4h - SSI  #Aspiration Pneumonia - Trend WBC and monitor fever curve - Afebrile; tylenol  prn for fevers - Continue Unasyn  and Doxycycline  - Narrow pending culture and sensitivities - Strep pneumo urinary antigen negative - Blood cultures x 2 no growth at 2 days - Respiratory culture with gram stain: negative  Labs   CBC: Recent Labs  Lab 06/15/24 2005 06/16/24 0440 06/16/24 0944 06/17/24 0330  WBC 5.3  --  22.2* 24.9*  NEUTROABS  --   --  16.5*  --   HGB 12.2 11.2* 11.2* 11.0*  HCT 37.2 35.9* 34.3* 33.2*  MCV 95.1  --  95.0 94.3  PLT 87*  --  119* 108*    Basic Metabolic Panel: Recent Labs  Lab 06/15/24 2005 06/16/24 0440 06/17/24 0330  NA 138 137 134*  K 4.2 3.6  3.9  CL 99 100 101  CO2 24 25 20*  GLUCOSE 109* 95 132*  BUN 13 16 26*  CREATININE 0.90 1.01* 1.22*  CALCIUM 9.3 8.5* 7.7*  MG  --  1.8 1.6*  PHOS  --  2.8 4.5   GFR: Estimated Creatinine Clearance: 57.4 mL/min (A) (by C-G formula based on SCr of 1.22 mg/dL (H)). Recent Labs  Lab 06/15/24 2005 06/15/24 2203 06/16/24 0440 06/16/24 0944 06/17/24 0330  PROCALCITON  --   --  18.20  --  24.10  WBC 5.3  --   --  22.2* 24.9*  LATICACIDVEN  --  1.5  --   --  1.9    Liver Function Tests: Recent Labs  Lab 06/15/24 2005 06/17/24 0330  AST 23  --   ALT 6  --   ALKPHOS 76  --   BILITOT 1.4*  --   PROT 7.1  --   ALBUMIN 4.4 3.4*   Recent Labs  Lab 06/15/24 2005  LIPASE 18   No results for input(s): AMMONIA in the last 168 hours.  ABG    Component Value Date/Time   PHART 7.37 06/16/2024 0501   PCO2ART 42 06/16/2024 0501   PO2ART 90 06/16/2024 0501   HCO3 24.3 06/16/2024 0501   ACIDBASEDEF 1.1 06/16/2024 0501   O2SAT  98.6 06/16/2024 0501     Coagulation Profile: Recent Labs  Lab 06/16/24 0443  INR 1.2    Cardiac Enzymes: No results for input(s): CKTOTAL, CKMB, CKMBINDEX, TROPONINI in the last 168 hours.  HbA1C: No results found for: HGBA1C  CBG: Recent Labs  Lab 06/16/24 1642 06/16/24 1923 06/16/24 2313 06/17/24 0321 06/17/24 0721  GLUCAP 110* 127* 120* 123* 114*    Review of Systems:   Unable to assess as patient is intubated and sedated.  Past Medical History:  She,  has a past medical history of History of shingles, Knee pain, and Migraines.   Surgical History:   Past Surgical History:  Procedure Laterality Date   COLONOSCOPY WITH PROPOFOL  N/A 02/19/2020   Procedure: COLONOSCOPY WITH PROPOFOL ;  Surgeon: Janalyn Keene NOVAK, MD;  Location: [facility] ENDOSCOPY;  Service: Endoscopy;  Laterality: N/A;   REPLACEMENT TOTAL KNEE     left knee   TUBAL LIGATION       Social History:   reports that she has never smoked. She has never  used smokeless tobacco. She reports that she does not drink alcohol and does not use drugs.   Family History:  Her family history includes Cancer in her brother and father; Heart disease in her mother; Hypertension in her mother.   Allergies No Known Allergies   Home Medications  Prior to Admission medications   Medication Sig Start Date End Date Taking? Authorizing Provider  Acetaminophen  (TYLENOL  8 HOUR PO) Take by mouth as needed.    [provider]  albuterol  (VENTOLIN  HFA) 108 (90 Base) MCG/ACT inhaler Inhale 1-2 puffs into the lungs every 6 (six) hours as needed for wheezing or shortness of breath. 01/25/21 01/25/22  Melvin Pao, NP  apixaban  (ELIQUIS ) 5 MG TABS tablet Take 1 tablet (5 mg total) by mouth 2 (two) times daily. 12/22/21   Melvin Pao, NP  gabapentin  (NEURONTIN ) 600 MG tablet Take 1 tablet (600 mg total) by mouth 3 (three) times daily as needed. 12/22/21   Melvin Pao, NP  metoprolol  succinate (TOPROL -XL) 100 MG 24 hr tablet Take 1 tablet (100 mg total) by mouth in the morning and at bedtime. Take with or immediately following a meal. 08/25/21 11/23/21  Melvin Pao, NP  ondansetron  (ZOFRAN ) 4 MG tablet Take 1 tablet (4 mg total) by mouth every 8 (eight) hours as needed for nausea or vomiting. 08/10/20   Rumball, Alison M, DO  Spacer/Aero-Holding Chambers [facility] DIAMOND) MISC by Does not apply route. 08/17/20   [provider]  torsemide  (DEMADEX ) 20 MG tablet TAKE 1 TABLET BY MOUTH EVERY DAY Patient not taking: Reported on 04/12/2022 12/08/21   Cindie Ole DASEN, MD     Critical care time: 55 minutes     Delmos Velaquez, PA-C [facility] PCCM Team Contact Info: [phone]

## 2024-06-18 LAB — BLOOD GAS, ARTERIAL
Acid-Base Excess: 4.5 mmol/L — ABNORMAL HIGH (ref 0.0–2.0)
Bicarbonate: 28.4 mmol/L — ABNORMAL HIGH (ref 20.0–28.0)
O2 Content: 30 L/min
O2 Saturation: 99.1 %
PEEP: 5 cmH2O
Patient temperature: 37
Pressure support: 5 cmH2O
Spontaneous VT: 319 mL
pCO2 arterial: 39 mmHg (ref 32–48)
pH, Arterial: 7.47 — ABNORMAL HIGH (ref 7.35–7.45)
pO2, Arterial: 83 mmHg (ref 83–108)

## 2024-06-18 LAB — CULTURE, RESPIRATORY W GRAM STAIN: Culture: NO GROWTH

## 2024-06-18 LAB — CBC
HCT: 30 % — ABNORMAL LOW (ref 36.0–46.0)
Hemoglobin: 10.1 g/dL — ABNORMAL LOW (ref 12.0–15.0)
MCH: 31.3 pg (ref 26.0–34.0)
MCHC: 33.7 g/dL (ref 30.0–36.0)
MCV: 92.9 fL (ref 80.0–100.0)
Platelets: 104 K/uL — ABNORMAL LOW (ref 150–400)
RBC: 3.23 MIL/uL — ABNORMAL LOW (ref 3.87–5.11)
RDW: 14 % (ref 11.5–15.5)
WBC: 14.6 K/uL — ABNORMAL HIGH (ref 4.0–10.5)
nRBC: 0 % (ref 0.0–0.2)

## 2024-06-18 LAB — GLUCOSE, CAPILLARY
Glucose-Capillary: 139 mg/dL — ABNORMAL HIGH (ref 70–99)
Glucose-Capillary: 133 mg/dL — ABNORMAL HIGH (ref 70–99)
Glucose-Capillary: 121 mg/dL — ABNORMAL HIGH (ref 70–99)
Glucose-Capillary: 121 mg/dL — ABNORMAL HIGH (ref 70–99)
Glucose-Capillary: 112 mg/dL — ABNORMAL HIGH (ref 70–99)

## 2024-06-18 LAB — RENAL FUNCTION PANEL
Albumin: 3.4 g/dL — ABNORMAL LOW (ref 3.5–5.0)
Anion gap: 14 (ref 5–15)
BUN: 32 mg/dL — ABNORMAL HIGH (ref 8–23)
CO2: 23 mmol/L (ref 22–32)
Calcium: 8 mg/dL — ABNORMAL LOW (ref 8.9–10.3)
Chloride: 99 mmol/L (ref 98–111)
Creatinine, Ser: 1.12 mg/dL — ABNORMAL HIGH (ref 0.44–1.00)
GFR, Estimated: 52 mL/min — ABNORMAL LOW (ref >60)
Glucose, Bld: 130 mg/dL — ABNORMAL HIGH (ref 70–99)
Phosphorus: 3.3 mg/dL (ref 2.5–4.6)
Potassium: 3.3 mmol/L — ABNORMAL LOW (ref 3.5–5.1)
Sodium: 136 mmol/L (ref 135–145)

## 2024-06-18 LAB — MAGNESIUM: Magnesium: 2.1 mg/dL (ref 1.7–2.4)

## 2024-06-18 MED ORDER — POTASSIUM CHLORIDE 10 MEQ/100ML IV SOLN
10.0000 meq | INTRAVENOUS | Status: AC
  Administered 2024-06-18 (×2): 10 meq via INTRAVENOUS
  Filled 2024-06-18 (×2): qty 100

## 2024-06-18 MED ORDER — INSULIN ASPART 100 UNIT/ML IJ SOLN: 0.0000 [IU] | Freq: Every day | INTRAMUSCULAR | Status: DC

## 2024-06-18 MED ORDER — FUROSEMIDE 10 MG/ML IJ SOLN: 20.0000 mg | Freq: Once | INTRAMUSCULAR | Status: DC

## 2024-06-18 MED ORDER — METHYLPREDNISOLONE SODIUM SUCC 40 MG IJ SOLR
40.0000 mg | Freq: Two times a day (BID) | INTRAMUSCULAR | Status: DC
  Administered 2024-06-18 – 2024-06-19 (×2): 40 mg via INTRAVENOUS
  Filled 2024-06-18 (×2): qty 1

## 2024-06-18 MED ORDER — BUDESONIDE 0.5 MG/2ML IN SUSP
0.5000 mg | Freq: Two times a day (BID) | RESPIRATORY_TRACT | Status: DC
  Administered 2024-06-18 – 2024-06-26 (×16): 0.5 mg via RESPIRATORY_TRACT
  Filled 2024-06-18 (×16): qty 2

## 2024-06-18 MED ORDER — ORAL CARE MOUTH RINSE: 15.0000 mL | OROMUCOSAL | Status: DC | PRN

## 2024-06-18 MED ORDER — IPRATROPIUM-ALBUTEROL 0.5-2.5 (3) MG/3ML IN SOLN
3.0000 mL | RESPIRATORY_TRACT | Status: DC
  Administered 2024-06-18 – 2024-06-19 (×7): 3 mL via RESPIRATORY_TRACT
  Filled 2024-06-18 (×7): qty 3

## 2024-06-18 MED ORDER — FUROSEMIDE 10 MG/ML IJ SOLN
40.0000 mg | Freq: Once | INTRAMUSCULAR | Status: AC
  Administered 2024-06-18: 40 mg via INTRAVENOUS
  Filled 2024-06-18: qty 4

## 2024-06-18 MED ORDER — INSULIN ASPART 100 UNIT/ML IJ SOLN
0.0000 [IU] | Freq: Three times a day (TID) | INTRAMUSCULAR | Status: DC
  Administered 2024-06-19 – 2024-06-24 (×8): 1 [IU] via SUBCUTANEOUS
  Administered 2024-06-28: 2 [IU] via SUBCUTANEOUS
  Filled 2024-06-18: qty 1
  Filled 2024-06-18: qty 2
  Filled 2024-06-18 (×4): qty 1
  Filled 2024-06-18: qty 2
  Filled 2024-06-18 (×2): qty 1

## 2024-06-18 MED ORDER — FENTANYL CITRATE (PF) 50 MCG/ML IJ SOSY
25.0000 ug | PREFILLED_SYRINGE | INTRAMUSCULAR | Status: DC | PRN
  Administered 2024-06-18 – 2024-06-19 (×2): 50 ug via INTRAVENOUS
  Administered 2024-06-21: 25 ug via INTRAVENOUS
  Filled 2024-06-18 (×3): qty 1

## 2024-06-18 NOTE — Plan of Care (Signed)
  Problem: Activity: Goal: Ability to tolerate increased activity will improve Outcome: Progressing   Problem: Respiratory: Goal: Ability to maintain a clear airway and adequate ventilation will improve Outcome: Progressing   Problem: Role Relationship: Goal: Method of communication will improve Outcome: Progressing   Problem: Education: Goal: Knowledge of General Education information will improve Description: Including pain rating scale, medication(s)/side effects and non-pharmacologic comfort measures Outcome: Progressing   Problem: Health Behavior/Discharge Planning: Goal: Ability to manage health-related needs will improve Outcome: Progressing   Problem: Clinical Measurements: Goal: Ability to maintain clinical measurements within normal limits will improve Outcome: Progressing Goal: Will remain free from infection Outcome: Progressing Goal: Diagnostic test results will improve Outcome: Progressing Goal: Respiratory complications will improve Outcome: Progressing Goal: Cardiovascular complication will be avoided Outcome: Progressing   Problem: Activity: Goal: Risk for activity intolerance will decrease Outcome: Progressing   Problem: Nutrition: Goal: Adequate nutrition will be maintained Outcome: Progressing   Problem: Coping: Goal: Level of anxiety will decrease Outcome: Progressing   Problem: Elimination: Goal: Will not experience complications related to bowel motility Outcome: Progressing Goal: Will not experience complications related to urinary retention Outcome: Progressing   Problem: Pain Managment: Goal: General experience of comfort will improve and/or be controlled Outcome: Progressing   Problem: Safety: Goal: Ability to remain free from injury will improve Outcome: Progressing   Problem: Skin Integrity: Goal: Risk for impaired skin integrity will decrease Outcome: Progressing   Problem: Education: Goal: Ability to identify signs and  symptoms of gastrointestinal bleeding will improve Outcome: Progressing   Problem: Bowel/Gastric: Goal: Will show no signs and symptoms of gastrointestinal bleeding Outcome: Progressing   Problem: Fluid Volume: Goal: Will show no signs and symptoms of excessive bleeding Outcome: Progressing   Problem: Clinical Measurements: Goal: Complications related to the disease process, condition or treatment will be avoided or minimized Outcome: Progressing   Problem: Education: Goal: Ability to demonstrate management of disease process will improve Outcome: Progressing Goal: Ability to verbalize understanding of medication therapies will improve Outcome: Progressing Goal: Individualized Educational Video(s) Outcome: Progressing   Problem: Activity: Goal: Capacity to carry out activities will improve Outcome: Progressing   Problem: Cardiac: Goal: Ability to achieve and maintain adequate cardiopulmonary perfusion will improve Outcome: Progressing   Problem: Education: Goal: Knowledge of disease or condition will improve Outcome: Progressing Goal: Understanding of medication regimen will improve Outcome: Progressing Goal: Individualized Educational Video(s) Outcome: Progressing   Problem: Activity: Goal: Ability to tolerate increased activity will improve Outcome: Progressing   Problem: Cardiac: Goal: Ability to achieve and maintain adequate cardiopulmonary perfusion will improve Outcome: Progressing   Problem: Health Behavior/Discharge Planning: Goal: Ability to safely manage health-related needs after discharge will improve Outcome: Progressing

## 2024-06-18 NOTE — Progress Notes (Signed)
 PHARMACY - PHYSICIAN COMMUNICATION CRITICAL VALUE ALERT - BLOOD CULTURE IDENTIFICATION (BCID)  [name] is an 73 y.o. female who presented to [facility] on 06/15/2024 with a chief complaint of feeling poorly with a cough, currently being treated for multifocal pneumonia  Assessment:  blood culture from 11/24 now with 1 of 4 bottles (aerobic) growing GPR. Currently on Doxycycline  and Unasyn  for pneumonia.  Name of physician (or Provider) Contacted: Almarie Nose, NP  Current antibiotics: Doxycycline  and Unasyn   Changes to prescribed antibiotics recommended:  Patient is on recommended antibiotics - No changes needed  No results found for this or any previous visit.  [name] 06/18/2024  11:52 PM

## 2024-06-18 NOTE — Consult Note (Signed)
 PHARMACY CONSULT NOTE - ELECTROLYTES  Pharmacy Consult for Electrolyte Monitoring and Replacement   Recent Labs: Height: 5' 3 (160 cm) Weight: (!) 140.6 kg (309 lb 15.5 oz) IBW/kg (Calculated) : 52.4 Estimated Creatinine Clearance: 62.9 mL/min (A) (by C-G formula based on SCr of 1.12 mg/dL (H)). Potassium (mmol/L)  Date Value  06/18/2024 3.3 (L)   Magnesium  (mg/dL)  Date Value  88/72/7974 2.1   Calcium (mg/dL)  Date Value  88/72/7974 8.0 (L)   Albumin (g/dL)  Date Value  88/72/7974 3.4 (L)  10/25/2021 4.2   Phosphorus (mg/dL)  Date Value  88/72/7974 3.3   Sodium (mmol/L)  Date Value  06/18/2024 136  10/25/2021 144    Assessment  [name] is a 73 y.o. female presenting with hematemesis. PMH significant for pulmonary hypertension, thrombocytopenia, hypertension, CAD, polyneuropathy, atrial fibrillation, cardiomyopathy. Pharmacy has been consulted to monitor and replace electrolytes.  Diet: NPO, intubated MIVF: N/A Pertinent medications: norepinephrine  gtt, phenylephrine  gtt, vasopressin  gtt  Goal of Therapy: Electrolytes WNL  Plan:  K 3.3: Kcl 10mEq IV x 2 Check BMP, Mg, Phos with AM labs  Thank you for allowing pharmacy to be a part of this patient's care.  Hasten Sweitzer A Sharai Overbay, PharmD Clinical Pharmacist 06/18/2024 8:46 AM

## 2024-06-18 NOTE — Progress Notes (Signed)
 NAME:  [name], MRN:  [id number], DOB:  [birth year], LOS: 3 ADMISSION DATE:  06/15/2024, CONSULTATION DATE:  06/15/24 REFERRING MD:  Dr. Dorothyann   CHIEF COMPLAINT:  Hematemesis, multifocal pneumonia  History of Present Illness:  [name] is a 73 year old who presented to [facility] ED from home for evaluation of hematemesis.   History obtained per chart review and family bedside report. Patient was in her normal state of health until 11/21. Family reported she started feeling poorly with a cough, they denied congestion, complaints of dyspnea or chest pain. Then starting on 11/24 the patient began feeling nauseous and having hematemesis. Family denied any fever/ chills or any other complaints. And no previous episodes of hematemesis or GI bleeding that family is aware of. Patient is documented as complaining of abdominal pain, but family denies this. Family reports patient was on Eliquis  due to Chronic A-fib but stopped a year ago due to financial constraints. She had a recent PCP appointment and was expecting to hear from someone to assist her in obtaining medication, but hadn't heard anything yet. Family also reports some non-compliance with medication, that she tries not to take medicine if she doesn't need it. They deny any tobacco use/ ETOH use or recreational drug use history.   CXR 06/15/24: Interval placement of an endotracheal tube with the tip measuring 3.1 cm above the carina and an enteric tube with the tip off the field of view but below the left hemidiaphragm.2. Airspace infiltrates in the right perihilar region and throughout the left lung, similar to the prior study, which may represent pneumonia, edema, or aspiration. 3. Cardiac enlargement.  CT chest wo contrast 06/15/24: . Extensive bilateral airspace disease, most pronounced throughout the left lung and in the right lower lobe, concerning for pneumonia. 2. Trace bilateral pleural effusions. 3. Cardiomegaly, coronary  artery disease. 4. Aortic atherosclerosis.  CT angio GIB protocol 06/15/24:  No active GI bleeding. 2. No occlusion or hemodynamically significant stenosis of the abdominal or pelvic arterial system. No abdominal aortic aneurysm or dissection. 3. Presacral perirectal 6.5 x 6.2 cm fluid collection, minimally changed since 2012 and likely benign. 4. Moderate-sized umbilical hernia containing fat and a small amount of fluid, increased in size since 2012   PCCM consulted for admission due to acute hypoxic respiratory failure s/t suspected aspiration pneumonia in the setting of suspected acute upper GIB on mechanical ventilatory support.  Pertinent Medical History  Pulmonary Hypertension Thrombocytopenia Hypertension CAD Polyneuropathy PAF - Previously on Eliquis  but stopped last year d/t financial constraints Cardiomyopathy (ECHO 07/2020 LVEF 60-65%; severe LA dilatation, mild RA dilatation, borderline pulmonary hypertension w/ PASP )  Significant Hospital Events: Including procedures, antibiotic start and stop dates in addition to other pertinent events   06/15/24: Admit to ICU due to acute hypoxic respiratory failure s/t suspected aspiration pneumonia in the setting of suspected acute upper GIB on mechanical ventilatory support. 06/16/24: Remains critically ill requiring mechanical ventilation with increasing pressor requirements. Central line and arterial line placed. GI following; plan for EGD today. 06/17/24: EGD found evidence of Mallory Weiss tear without active bleeding. Remains critically ill requiring mechanical ventilation. Vent requirements decreasing. Pressor requirements coming down. Cardiology following. 11/27 plan for wake-up assessment today  Interim History / Subjective:  Remains critically ill Multifocal pneumonia Plan for workup assessment SAT SBT when family arrives   Objective    Blood pressure (!) 140/76, pulse 67, temperature 97.9 F (36.6 C), resp. rate  16, height 5' 3 (1.6  m), weight (!) 140.6 kg, SpO2 95%. CVP:  [10 mmHg-17 mmHg] 17 mmHg  Vent Mode: PRVC FiO2 (%):  [30 %] 30 % Set Rate:  [16 bmp] 16 bmp Vt Set:  [420 mL] 420 mL PEEP:  [5 cmH20] 5 cmH20 Pressure Support:  [5 cmH20] 5 cmH20 Plateau Pressure:  [22 cmH20-25 cmH20] 24 cmH20   Intake/Output Summary (Last 24 hours) at 06/18/2024 9271 Last data filed at 06/18/2024 0600 Gross per 24 hour  Intake 2272.86 ml  Output 2375 ml  Net -102.14 ml   Filed Weights   06/16/24 0500 06/17/24 0500 06/18/24 0500  Weight: (!) 138.4 kg (!) 139.3 kg (!) 140.6 kg     REVIEW OF SYSTEMS  PATIENT IS UNABLE TO PROVIDE COMPLETE REVIEW OF SYSTEMS DUE TO SEVERE CRITICAL ILLNESS   PHYSICAL EXAMINATION:  GENERAL:critically ill appearing EYES: Pupils equal, round, reactive to light.  No scleral icterus.  MOUTH: Moist mucosal membrane. INTUBATED NECK: Supple.  PULMONARY: Lungs clear to auscultation, +rhonchi, +wheezing CARDIOVASCULAR: S1 and S2.  Regular rate and rhythm GASTROINTESTINAL: Soft, nontender, -distended. Positive bowel sounds.  MUSCULOSKELETAL: No swelling, clubbing, or edema.  NEUROLOGIC: obtunded,sedated SKIN:normal, warm to touch, Capillary refill delayed  Pulses present bilaterally    Assessment and Plan  73 year old white female admitted for severe metabolic encephalopathy due to multifocal pneumonia with septic shock nausea and vomiting causing hematemesis from Mallory-Weiss tear with chronic persistent atrial fibrillation with RVR and acute decompensated heart failure  Severe ACUTE Hypoxic and Hypercapnic Respiratory Failure -continue Mechanical Ventilator support -Wean Fio2 and PEEP as tolerated -VAP/VENT bundle implementation - Wean PEEP & FiO2 as tolerated, maintain SpO2 > 88% - Head of bed elevated 30 degrees, VAP protocol in place - Plateau pressures less than 30 cm H20  - Intermittent chest x-ray & ABG PRN - Ensure adequate pulmonary hygiene  -will perform  SAT/SBT when respiratory parameters are met  INFECTIOUS DISEASE -continue antibiotics as prescribed -follow up cultures   NEUROLOGY ACUTE METABOLIC ENCEPHALOPATHY - Daily WUA as able; following commands - Encourage family support at bedside  CARDIAC Chronic/Paroxysmal Atrial Fibrillation with RVR Acute Decompensated Heart Failure s/t Septic Shock #Mildly Elevated Troponin s/t Demand Ischemia Hx: Hypertension, PAF, Pulmonary Hypertension, Cardiomyopathy 06/16/24 ECHO: LVEF 60-65% with grade II diastolic dysfunction; moderately elevated pulmonary artery systolic pressure; tricuspid regurgitation with right atrial pressure of . - Continuous cardiac monitoring - Vasopressors to maintain MAP >65 ~ weaned off all pressors - Continue amiodarone  gtt - EKG prn - Most recent troponin 39 - BNP 5,289; lasix  60mg  x 1 - Cardiology following; appreciate input  ACUTE KIDNEY INJURY/Renal Failure -continue Foley Catheter-assess need -Avoid nephrotoxic agents -Follow urine output, BMP -Ensure adequate renal perfusion, optimize oxygenation -Renal dose medications   Intake/Output Summary (Last 24 hours) at 06/18/2024 0732 Last data filed at 06/18/2024 0600 Gross per 24 hour  Intake 2272.86 ml  Output 2375 ml  Net -102.14 ml    Acute Upper GI Bleed s/t Mallory Weiss Tear ~ Resolved 06/15/24 CTA GI Bleed: no active GI bleed; no occlusion or HD significant stenosis of abdominal or pelvic arterial system. No AAA or dissection.  - Continue PPI: Protonix  40mg  - Constipation protocol prn - Diet: NPO - Antiemetics prn - EGD: evidence of mallory weiss tear without signs of active bleeding - GI signed off  ENDO - ICU hypoglycemic\Hyperglycemia protocol -check FSBS per protocol   GI GI PROPHYLAXIS as indicated NUTRITIONAL STATUS DIET-->TF's as tolerated Constipation protocol as indicated   ELECTROLYTES -follow labs as  needed -replace as needed -pharmacy consultation and  following  RESTRICTIVE TRANSFUSION PROTOCOL TRANSFUSION  IF HGB<7  or ACTIVE BLEEDING OR DX of ACUTE CORONARY SYNDROMES     Labs   CBC: Recent Labs  Lab 06/15/24 2005 06/16/24 0440 06/16/24 0944 06/17/24 0330 06/18/24 0335  WBC 5.3  --  22.2* 24.9* 14.6*  NEUTROABS  --   --  16.5*  --   --   HGB 12.2 11.2* 11.2* 11.0* 10.1*  HCT 37.2 35.9* 34.3* 33.2* 30.0*  MCV 95.1  --  95.0 94.3 92.9  PLT 87*  --  119* 108* 104*    Basic Metabolic Panel: Recent Labs  Lab 06/15/24 2005 06/16/24 0440 06/17/24 0330 06/18/24 0335  NA 138 137 134* 136  K 4.2 3.6 3.9 3.3*  CL 99 100 101 99  CO2 24 25 20* 23  GLUCOSE 109* 95 132* 130*  BUN 13 16 26* 32*  CREATININE 0.90 1.01* 1.22* 1.12*  CALCIUM 9.3 8.5* 7.7* 8.0*  MG  --  1.8 1.6*  --   PHOS  --  2.8 4.5 3.3   GFR: Estimated Creatinine Clearance: 62.9 mL/min (A) (by C-G formula based on SCr of 1.12 mg/dL (H)). Recent Labs  Lab 06/15/24 2005 06/15/24 2203 06/16/24 0440 06/16/24 0944 06/17/24 0330 06/18/24 0335  PROCALCITON  --   --  18.20  --  24.10  --   WBC 5.3  --   --  22.2* 24.9* 14.6*  LATICACIDVEN  --  1.5  --   --  1.9  --     Liver Function Tests: Recent Labs  Lab 06/15/24 2005 06/17/24 0330 06/18/24 0335  AST 23  --   --   ALT 6  --   --   ALKPHOS 76  --   --   BILITOT 1.4*  --   --   PROT 7.1  --   --   ALBUMIN 4.4 3.4* 3.4*   Recent Labs  Lab 06/15/24 2005  LIPASE 18   No results for input(s): AMMONIA in the last 168 hours.  ABG    Component Value Date/Time   PHART 7.37 06/16/2024 0501   PCO2ART 42 06/16/2024 0501   PO2ART 90 06/16/2024 0501   HCO3 24.3 06/16/2024 0501   ACIDBASEDEF 1.1 06/16/2024 0501   O2SAT 98.6 06/16/2024 0501     Coagulation Profile: Recent Labs  Lab 06/16/24 0443  INR 1.2    Cardiac Enzymes: No results for input(s): CKTOTAL, CKMB, CKMBINDEX, TROPONINI in the last 168 hours.  HbA1C: No results found for: HGBA1C  CBG: Recent Labs  Lab  06/17/24 1106 06/17/24 1653 06/17/24 1955 06/17/24 2310 06/18/24 0416  GLUCAP 114* 113* 133* 118* 139*  Home Medications  Prior to Admission medications   Medication Sig Start Date End Date Taking? Authorizing Provider  Acetaminophen  (TYLENOL  8 HOUR PO) Take by mouth as needed.    [provider]  albuterol  (VENTOLIN  HFA) 108 (90 Base) MCG/ACT inhaler Inhale 1-2 puffs into the lungs every 6 (six) hours as needed for wheezing or shortness of breath. 01/25/21 01/25/22  Melvin Pao, NP  apixaban  (ELIQUIS ) 5 MG TABS tablet Take 1 tablet (5 mg total) by mouth 2 (two) times daily. 12/22/21   Melvin Pao, NP  gabapentin  (NEURONTIN ) 600 MG tablet Take 1 tablet (600 mg total) by mouth 3 (three) times daily as needed. 12/22/21   Melvin Pao, NP  metoprolol  succinate (TOPROL -XL) 100 MG 24 hr tablet Take 1 tablet (100 mg total)  by mouth in the morning and at bedtime. Take with or immediately following a meal. 08/25/21 11/23/21  Melvin Pao, NP  ondansetron  (ZOFRAN ) 4 MG tablet Take 1 tablet (4 mg total) by mouth every 8 (eight) hours as needed for nausea or vomiting. 08/10/20   Rumball, Alison M, DO  Spacer/Aero-Holding Chambers [facility] DIAMOND) MISC by Does not apply route. 08/17/20   [provider]  torsemide  (DEMADEX ) 20 MG tablet TAKE 1 TABLET BY MOUTH EVERY DAY Patient not taking: Reported on 04/12/2022 12/08/21   Cindie Ole DASEN, MD      DVT/GI PRX  assessed I Assessed the need for Labs I Assessed the need for Foley I Assessed the need for Central Venous Line Family Discussion when available I Assessed the need for Mobilization I made an Assessment of medications to be adjusted accordingly Safety Risk assessment completed  CASE DISCUSSED IN MULTIDISCIPLINARY ROUNDS WITH ICU TEAM     Critical Care Time devoted to patient care services described in this note is 55 minutes.  Critical care was necessary to treat /prevent imminent and life-threatening  deterioration.   Nickolas Alm Cellar, M.D.  [facility]  Medical Director [facility]

## 2024-06-18 NOTE — Progress Notes (Signed)
 Patient extubated to 2 LPM nasal cannula with no complications.  O2 saturation and vital signs remained stable througout.  Patient able to cough and speak after extubation.

## 2024-06-18 NOTE — IPAL (Signed)
  Interdisciplinary Goals of Care Family Meeting   Date carried out: 06/18/2024  Location of the meeting: Bedside  Member's involved: Physician, Bedside Registered Nurse, and Family Member or next of kin    GOALS OF CARE DISCUSSION  The Clinical status was relayed to family in detail-Son at bedside  Updated and notified of patients medical condition-   PATIENT REMAINS FULL CODE  Family understands the situation.  Plan to trial of extubation  Family are satisfied with Plan of action and management. All questions answered  Additional CC time 35 mins   [name], M.D.  [facility]  Medical Director [facility] Medical Director [facility] Cardio-Pulmonary Department

## 2024-06-19 LAB — CBC
HCT: 30.5 % — ABNORMAL LOW (ref 36.0–46.0)
Hemoglobin: 9.9 g/dL — ABNORMAL LOW (ref 12.0–15.0)
MCH: 30.4 pg (ref 26.0–34.0)
MCHC: 32.5 g/dL (ref 30.0–36.0)
MCV: 93.6 fL (ref 80.0–100.0)
Platelets: 107 K/uL — ABNORMAL LOW (ref 150–400)
RBC: 3.26 MIL/uL — ABNORMAL LOW (ref 3.87–5.11)
RDW: 14 % (ref 11.5–15.5)
WBC: 8.6 K/uL (ref 4.0–10.5)
nRBC: 0.6 % — ABNORMAL HIGH (ref 0.0–0.2)

## 2024-06-19 LAB — RENAL FUNCTION PANEL
Albumin: 3.7 g/dL (ref 3.5–5.0)
Anion gap: 14 (ref 5–15)
BUN: 34 mg/dL — ABNORMAL HIGH (ref 8–23)
CO2: 27 mmol/L (ref 22–32)
Calcium: 8.4 mg/dL — ABNORMAL LOW (ref 8.9–10.3)
Chloride: 99 mmol/L (ref 98–111)
Creatinine, Ser: 1.16 mg/dL — ABNORMAL HIGH (ref 0.44–1.00)
GFR, Estimated: 50 mL/min — ABNORMAL LOW (ref >60)
Glucose, Bld: 123 mg/dL — ABNORMAL HIGH (ref 70–99)
Phosphorus: 2.9 mg/dL (ref 2.5–4.6)
Potassium: 3.1 mmol/L — ABNORMAL LOW (ref 3.5–5.1)
Sodium: 140 mmol/L (ref 135–145)

## 2024-06-19 LAB — GLUCOSE, CAPILLARY
Glucose-Capillary: 121 mg/dL — ABNORMAL HIGH (ref 70–99)
Glucose-Capillary: 124 mg/dL — ABNORMAL HIGH (ref 70–99)
Glucose-Capillary: 134 mg/dL — ABNORMAL HIGH (ref 70–99)
Glucose-Capillary: 124 mg/dL — ABNORMAL HIGH (ref 70–99)

## 2024-06-19 LAB — POTASSIUM
Potassium: 3.2 mmol/L — ABNORMAL LOW (ref 3.5–5.1)
Potassium: 3.6 mmol/L (ref 3.5–5.1)

## 2024-06-19 LAB — MAGNESIUM: Magnesium: 2.5 mg/dL — ABNORMAL HIGH (ref 1.7–2.4)

## 2024-06-19 MED ORDER — ACETAMINOPHEN 10 MG/ML IV SOLN
1000.0000 mg | Freq: Four times a day (QID) | INTRAVENOUS | Status: AC
  Administered 2024-06-19 – 2024-06-20 (×3): 1000 mg via INTRAVENOUS
  Filled 2024-06-19 (×4): qty 100

## 2024-06-19 MED ORDER — FUROSEMIDE 10 MG/ML IJ SOLN
40.0000 mg | Freq: Once | INTRAMUSCULAR | Status: AC
  Administered 2024-06-19: 40 mg via INTRAVENOUS
  Filled 2024-06-19: qty 4

## 2024-06-19 MED ORDER — METHYLPREDNISOLONE SODIUM SUCC 40 MG IJ SOLR
20.0000 mg | Freq: Two times a day (BID) | INTRAMUSCULAR | Status: DC
  Administered 2024-06-19 – 2024-06-23 (×8): 20 mg via INTRAVENOUS
  Filled 2024-06-19 (×8): qty 1

## 2024-06-19 MED ORDER — POTASSIUM CHLORIDE 10 MEQ/50ML IV SOLN
10.0000 meq | INTRAVENOUS | Status: AC
  Administered 2024-06-19 (×5): 10 meq via INTRAVENOUS
  Filled 2024-06-19 (×5): qty 50

## 2024-06-19 MED ORDER — HYDROMORPHONE HCL 1 MG/ML IJ SOLN
1.0000 mg | INTRAMUSCULAR | Status: DC | PRN
  Administered 2024-06-19: 1 mg via INTRAVENOUS
  Administered 2024-06-20: 0.5 mg via INTRAVENOUS
  Administered 2024-06-20: 1 mg via INTRAVENOUS
  Filled 2024-06-19 (×2): qty 1

## 2024-06-19 MED ORDER — IPRATROPIUM-ALBUTEROL 0.5-2.5 (3) MG/3ML IN SOLN
3.0000 mL | Freq: Two times a day (BID) | RESPIRATORY_TRACT | Status: DC
  Administered 2024-06-20 – 2024-06-21 (×3): 3 mL via RESPIRATORY_TRACT
  Filled 2024-06-19 (×3): qty 3

## 2024-06-19 MED ORDER — POTASSIUM CHLORIDE 10 MEQ/50ML IV SOLN
10.0000 meq | INTRAVENOUS | Status: AC
  Administered 2024-06-19 (×4): 10 meq via INTRAVENOUS
  Filled 2024-06-19 (×4): qty 50

## 2024-06-19 MED ORDER — HYDROMORPHONE HCL 1 MG/ML IJ SOLN
INTRAMUSCULAR | Status: AC
  Filled 2024-06-19: qty 1

## 2024-06-19 NOTE — Plan of Care (Signed)
  Problem: Respiratory: Goal: Ability to maintain a clear airway and adequate ventilation will improve Outcome: Progressing   Problem: Clinical Measurements: Goal: Ability to maintain clinical measurements within normal limits will improve Outcome: Progressing Goal: Respiratory complications will improve Outcome: Progressing Goal: Cardiovascular complication will be avoided Outcome: Progressing   Problem: Elimination: Goal: Will not experience complications related to urinary retention Outcome: Progressing   Problem: Bowel/Gastric: Goal: Will show no signs and symptoms of gastrointestinal bleeding Outcome: Progressing

## 2024-06-19 NOTE — Progress Notes (Signed)
 NAME:  [name], MRN:  [id number], DOB:  [birth year], LOS: 4 ADMISSION DATE:  06/15/2024, CONSULTATION DATE:  06/15/24 REFERRING MD:  Dr. Dorothyann, CHIEF COMPLAINT:  Hematemesis, Multifocal pneumonia   History of Present Illness:  [name] is a 73 year old who presented to [facility] ED from home for evaluation of hematemesis.   History obtained per chart review and family bedside report. Patient was in her normal state of health until 11/21. Family reported she started feeling poorly with a cough, they denied congestion, complaints of dyspnea or chest pain. Then starting on 11/24 the patient began feeling nauseous and having hematemesis. Family denied any fever/ chills or any other complaints. And no previous episodes of hematemesis or GI bleeding that family is aware of. Patient is documented as complaining of abdominal pain, but family denies this. Family reports patient was on Eliquis  due to Chronic A-fib but stopped a year ago due to financial constraints. She had a recent PCP appointment and was expecting to hear from someone to assist her in obtaining medication, but hadn't heard anything yet. Family also reports some non-compliance with medication, that she tries not to take medicine if she doesn't need it. They deny any tobacco use/ ETOH use or recreational drug use history.    CXR 06/15/24: Interval placement of an endotracheal tube with the tip measuring 3.1 cm above the carina and an enteric tube with the tip off the field of view but below the left hemidiaphragm.2. Airspace infiltrates in the right perihilar region and throughout the left lung, similar to the prior study, which may represent pneumonia, edema, or aspiration. 3. Cardiac enlargement.   CT chest wo contrast 06/15/24: . Extensive bilateral airspace disease, most pronounced throughout the left lung and in the right lower lobe, concerning for pneumonia. 2. Trace bilateral pleural effusions. 3. Cardiomegaly, coronary  artery disease. 4. Aortic atherosclerosis.   CT angio GIB protocol 06/15/24:  No active GI bleeding. 2. No occlusion or hemodynamically significant stenosis of the abdominal or pelvic arterial system. No abdominal aortic aneurysm or dissection. 3. Presacral perirectal 6.5 x 6.2 cm fluid collection, minimally changed since 2012 and likely benign. 4. Moderate-sized umbilical hernia containing fat and a small amount of fluid, increased in size since 2012   PCCM consulted for admission due to acute hypoxic respiratory failure s/t suspected aspiration pneumonia in the setting of suspected acute upper GIB on mechanical ventilatory support.  Pertinent  Medical History  Pulmonary Hypertension Thrombocytopenia Hypertension CAD Polyneuropathy PAF - Previously on Eliquis  but stopped last year d/t financial constraints Cardiomyopathy (ECHO 07/2020 LVEF 60-65%; severe LA dilatation, mild RA dilatation, borderline pulmonary hypertension w/ PASP )  Significant Hospital Events: Including procedures, antibiotic start and stop dates in addition to other pertinent events   06/15/24: Admit to ICU due to acute hypoxic respiratory failure s/t suspected aspiration pneumonia in the setting of suspected acute upper GIB on mechanical ventilatory support. 06/16/24: Remains critically ill requiring mechanical ventilation with increasing pressor requirements. Central line and arterial line placed. GI following; plan for EGD today. 06/17/24: EGD found evidence of Mallory Weiss tear without active bleeding. Remains critically ill requiring mechanical ventilation. Vent requirements decreasing. Pressor requirements coming down. Cardiology following. 06/18/24: plan for wake-up assessment today 06/19/24: Extubated yesterday to HFNC. No overnight events. Remains on amiodarone . No longer requiring pressors or sedation. Intermittent confusion. Satting well on room air.   Interim History / Subjective:  See above listed  under significant hospital events.  Objective  Blood pressure (!) 140/76, pulse 97, temperature 98.7 F (37.1 C), temperature source Axillary, resp. rate (!) 22, height 5' 3 (1.6 m), weight (!) 138.3 kg, SpO2 98%. CVP:  [14 mmHg-33 mmHg] 33 mmHg  Vent Mode: PSV;CPAP FiO2 (%):  [30 %-55 %] 45 % PEEP:  [5 cmH20] 5 cmH20 Pressure Support:  [5 cmH20] 5 cmH20   Intake/Output Summary (Last 24 hours) at 06/19/2024 9057 Last data filed at 06/19/2024 0800 Gross per 24 hour  Intake 1611.1 ml  Output 2165 ml  Net -553.9 ml   Filed Weights   06/17/24 0500 06/18/24 0500 06/19/24 0458  Weight: (!) 139.3 kg (!) 140.6 kg (!) 138.3 kg    Examination: General: ill appearing female, resting in bed with intermittent agitation in NAD HENT: atraumatic, normocephalic, supple, no JVD Lungs: mildly diminished bilaterally, equal throughout, mild wheezing and rhonci Cardiovascular: irregularly irregular, S1 S2, no m/r/g Abdomen: soft, non-tender, non-distended, no rebound/guarding, bowel sounds x 4 Extremities: normal bulk and tone, warm, dry, radial pulses 2+, distal 1+, UE edema Neuro: oriented to self only, follows commands with redirection, no focal neuro deficits, PERRL GU: foley draining clear yellow urine  Resolved problem list   Assessment and Plan   #Acute Metabolic Encephalopathy s/t Septic Shock d/t Multifocal Pneumonia  06/15/24 CT Head wo contrast: no acute intracranial abnormality - No longer requiring sedation - Encourage family support at bedside - Maintain appropriate sleep wake cycles   #Chronic/Paroxysmal Atrial Fibrillation with RVR #Septic Shock s/t Aspiration Pneumonia #Acute Decompensated Heart Failure s/t Septic Shock #Mildly Elevated Troponin s/t Demand Ischemia Hx: Hypertension, PAF, Pulmonary Hypertension, Cardiomyopathy 06/16/24 ECHO: LVEF 60-65% with grade II diastolic dysfunction; moderately elevated pulmonary artery systolic pressure; tricuspid regurgitation  with right atrial pressure of . - Continuous cardiac monitoring - Vasopressors to maintain MAP >65 ~ weaned off all pressors - Continue amiodarone  gtt - EKG prn - Most recent troponin 39 - Initial BNP 5,289 - Will diurese with lasix  40mg  x 1 - Cardiology following; appreciate input   #Acute Hypoxic Respiratory Failure s/t aspiration in the setting of refractory vomiting and Multifocal Pneumonia ~ Resolved 06/15/24 CT Chest wo contrast: extensive bilateral airspace disease, mostly throughout left lung and in right lower lobe; concern for pneumonia; trace bilateral pleural effusions; cardiomegaly, CAD. - Extubated to HFNC 06/18/24 - Alternate between [place] and room air as able - Supplemental oxygen to maintain O2 sats >88% - Duonebs prn - Solumedrol 20mg  Q12h - Intermittent CXR and ABG - Lactic normal at 1.5 - Procalcitonin 18.20  - Respiratory PCR (flu/covid/rsv): negative   #Mild AKI secondary to Septic Shock #Hypokalemia - Strict I/O - Trend BMP and monitor renal function - Creatinine 1.16 today - Avoid nephrotoxins as able - Ensure renal perfusion - ICU electrolyte replacement protocol - Potassium Chloride  IV - Pharmacy to assist with replacement   #Acute Upper GI Bleed s/t Mallory Weiss Tear ~ Resolved 06/15/24 CTA GI Bleed: no active GI bleed; no occlusion or HD significant stenosis of abdominal or pelvic arterial system. No AAA or dissection.  - Continue PPI: Protonix  40mg  - Constipation protocol prn - Diet: NPO - Antiemetics prn - EGD: evidence of mallory weiss tear without signs of active bleeding - GI signed off   #Acute Blood Loss Anemia s/t Mallory Weiss Tear ~ Resolved #Thrombocytopenia - Trend CBC - Monitor H/H and platelets - Transfuse if Hgb < 7; Hgb trending down at 9.9 - Monitor for s/sx of bleeding - Type and screen - Check coags  as indicated - DVT prophylaxis: SCDs; will need AC at some point   #Hyperglycemia w/o dx of Type II Diabetes  Mellitus - ICU hypo/hyperglycemia protocol - Range 140-180s - CBG Q4h - SSI   #Aspiration Pneumonia - Trend WBC and monitor fever curve - Afebrile; tylenol  prn for fevers - Continue Unasyn  and Doxycycline  - Narrow pending culture and sensitivities - Strep pneumo urinary antigen negative - Blood cultures x 2 no growth at 2 days - Respiratory culture with gram stain: negative  Labs   CBC: Recent Labs  Lab 06/15/24 2005 06/16/24 0440 06/16/24 0944 06/17/24 0330 06/18/24 0335 06/19/24 0250  WBC 5.3  --  22.2* 24.9* 14.6* 8.6  NEUTROABS  --   --  16.5*  --   --   --   HGB 12.2 11.2* 11.2* 11.0* 10.1* 9.9*  HCT 37.2 35.9* 34.3* 33.2* 30.0* 30.5*  MCV 95.1  --  95.0 94.3 92.9 93.6  PLT 87*  --  119* 108* 104* 107*    Basic Metabolic Panel: Recent Labs  Lab 06/15/24 2005 06/16/24 0440 06/17/24 0330 06/18/24 0335 06/18/24 0355 06/19/24 0250  NA 138 137 134* 136  --  140  K 4.2 3.6 3.9 3.3*  --  3.1*  CL 99 100 101 99  --  99  CO2 24 25 20* 23  --  27  GLUCOSE 109* 95 132* 130*  --  123*  BUN 13 16 26* 32*  --  34*  CREATININE 0.90 1.01* 1.22* 1.12*  --  1.16*  CALCIUM 9.3 8.5* 7.7* 8.0*  --  8.4*  MG  --  1.8 1.6*  --  2.1  --   PHOS  --  2.8 4.5 3.3  --  2.9   GFR: Estimated Creatinine Clearance: 60.1 mL/min (A) (by C-G formula based on SCr of 1.16 mg/dL (H)). Recent Labs  Lab 06/15/24 2203 06/16/24 0440 06/16/24 0944 06/17/24 0330 06/18/24 0335 06/19/24 0250  PROCALCITON  --  18.20  --  24.10  --   --   WBC  --   --  22.2* 24.9* 14.6* 8.6  LATICACIDVEN 1.5  --   --  1.9  --   --     Liver Function Tests: Recent Labs  Lab 06/15/24 2005 06/17/24 0330 06/18/24 0335 06/19/24 0250  AST 23  --   --   --   ALT 6  --   --   --   ALKPHOS 76  --   --   --   BILITOT 1.4*  --   --   --   PROT 7.1  --   --   --   ALBUMIN 4.4 3.4* 3.4* 3.7   Recent Labs  Lab 06/15/24 2005  LIPASE 18   No results for input(s): AMMONIA in the last 168 hours.  ABG     Component Value Date/Time   PHART 7.47 (H) 06/18/2024 1237   PCO2ART 39 06/18/2024 1237   PO2ART 83 06/18/2024 1237   HCO3 28.4 (H) 06/18/2024 1237   ACIDBASEDEF 1.1 06/16/2024 0501   O2SAT 99.1 06/18/2024 1237     Coagulation Profile: Recent Labs  Lab 06/16/24 0443  INR 1.2    Cardiac Enzymes: No results for input(s): CKTOTAL, CKMB, CKMBINDEX, TROPONINI in the last 168 hours.  HbA1C: No results found for: HGBA1C  CBG: Recent Labs  Lab 06/18/24 0754 06/18/24 1132 06/18/24 1937 06/18/24 2158 06/19/24 0728  GLUCAP 133* 121* 121* 112* 121*    Review of  Systems:   Denies chest pain, shortness of breath, headache, vision changes or abdominal pain.  Past Medical History:  She,  has a past medical history of History of shingles, Knee pain, and Migraines.   Surgical History:   Past Surgical History:  Procedure Laterality Date   COLONOSCOPY WITH PROPOFOL  N/A 02/19/2020   Procedure: COLONOSCOPY WITH PROPOFOL ;  Surgeon: Janalyn Keene NOVAK, MD;  Location: [facility] ENDOSCOPY;  Service: Endoscopy;  Laterality: N/A;   ESOPHAGOGASTRODUODENOSCOPY N/A 06/16/2024   Procedure: EGD (ESOPHAGOGASTRODUODENOSCOPY);  Surgeon: Jinny Carmine, MD;  Location: [facility] ENDOSCOPY;  Service: Endoscopy;  Laterality: N/A;   REPLACEMENT TOTAL KNEE     left knee   TUBAL LIGATION       Social History:   reports that she has never smoked. She has never used smokeless tobacco. She reports that she does not drink alcohol and does not use drugs.   Family History:  Her family history includes Cancer in her brother and father; Heart disease in her mother; Hypertension in her mother.   Allergies No Known Allergies   Home Medications  Prior to Admission medications   Medication Sig Start Date End Date Taking? Authorizing Provider  Acetaminophen  (TYLENOL  8 HOUR PO) Take by mouth as needed.   Yes [provider]  metoprolol  succinate (TOPROL -XL) 100 MG 24 hr tablet Take 1 tablet (100 mg  total) by mouth in the morning and at bedtime. Take with or immediately following a meal. 08/25/21 06/18/24 Yes Melvin Pao, NP  ondansetron  (ZOFRAN ) 4 MG tablet Take 1 tablet (4 mg total) by mouth every 8 (eight) hours as needed for nausea or vomiting. 08/10/20  Yes Rumball, Alison M, DO  pregabalin (LYRICA) 50 MG capsule Take 1 capsule by mouth 2 (two) times daily. 04/24/24  Yes [provider]  albuterol  (VENTOLIN  HFA) 108 (90 Base) MCG/ACT inhaler Inhale 1-2 puffs into the lungs every 6 (six) hours as needed for wheezing or shortness of breath. 01/25/21 01/25/22  Melvin Pao, NP  apixaban  (ELIQUIS ) 5 MG TABS tablet Take 1 tablet (5 mg total) by mouth 2 (two) times daily. Patient not taking: Reported on 06/18/2024 12/22/21   Melvin Pao, NP  gabapentin  (NEURONTIN ) 600 MG tablet Take 1 tablet (600 mg total) by mouth 3 (three) times daily as needed. Patient not taking: Reported on 06/16/2024 12/22/21   Melvin Pao, NP  Spacer/Aero-Holding [facility] DIAMOND) MISC by Does not apply route. 08/17/20   [provider]  torsemide  (DEMADEX ) 20 MG tablet TAKE 1 TABLET BY MOUTH EVERY DAY Patient not taking: Reported on 04/12/2022 12/08/21   Cindie Ole DASEN, MD     Critical care time: 55 minutes     Shaquira Moroz, PA-C [facility] PCCM Team Contact Info: [phone]

## 2024-06-19 NOTE — Evaluation (Signed)
 Physical Therapy Evaluation Patient Details Name: [name] MRN: [id number] DOB: 06-Jan-1951 Today's Date: 06/19/2024  History of Present Illness  Patient is a 73 year old female with acute hypoxic respiratory failure s/t suspected aspiration pneumonia in the setting of suspected acute upper GIB requiring vent support. Extubated 11/27. PMH: migraines, atrial fibrillation.  Clinical Impression  Patient is agreeable to PT evaluation. She is moaning out throughout session but reports she is in no pain. Family member at the bedside reports patient lives with other family members and uses a rolling walker for mobility at baseline.  Today the patient required +2 person assistance for mobility. She was able to stand with assistance but unable to take steps for ambulation today. She has generalized weakness and appears anxious with mobility. Recommend to continue PT to maximize independence and facilitate return to prior level of function. Consider rehabilitation < 3 hours/day after this hospital stay.       If plan is discharge home, recommend the following: A lot of help with walking and/or transfers;A lot of help with bathing/dressing/bathroom;Assistance with cooking/housework;Supervision due to cognitive status;Help with stairs or ramp for entrance;Assist for transportation   Can travel by private vehicle   No    Equipment Recommendations None recommended by PT  Recommendations for Other Services       Functional Status Assessment Patient has had a recent decline in their functional status and demonstrates the ability to make significant improvements in function in a reasonable and predictable amount of time.     Precautions / Restrictions Precautions Precautions: Fall Recall of Precautions/Restrictions: Intact Restrictions Weight Bearing Restrictions Per Provider Order: No      Mobility  Bed Mobility Overal bed mobility: Needs Assistance Bed Mobility: Supine to Sit, Sit to  Supine     Supine to sit: Max assist, +2 for physical assistance Sit to supine: Max assist, +2 for physical assistance   General bed mobility comments: assistance for trunk and BLE support. cues for technique and initiation    Transfers Overall transfer level: Needs assistance Equipment used: 2 person hand held assist Transfers: Sit to/from Stand Sit to Stand: Max assist, +2 physical assistance           General transfer comment: cues for anterior weight shifting. assistance for lifting and lowering    Ambulation/Gait               General Gait Details: unable to take steps at this time, generalized weakness and fatigue with minimal activity  Stairs            Wheelchair Mobility     Tilt Bed    Modified Rankin (Stroke Patients Only)       Balance Overall balance assessment: Needs assistance Sitting-balance support: Feet supported Sitting balance-Leahy Scale: Fair     Standing balance support: Bilateral upper extremity supported Standing balance-Leahy Scale: Zero Standing balance comment: +2 person assistance required                             Pertinent Vitals/Pain Pain Assessment Facial Expression: Relaxed, neutral Body Movements: Absence of movements Muscle Tension: Relaxed Compliance with ventilator (intubated pts.): N/A Vocalization (extubated pts.): Sighing, moaning CPOT Total: 1    Home Living Family/patient expects to be discharged to:: Private residence Living Arrangements: Other relatives Available Help at Discharge: Family Type of Home: House Home Access: Stairs to enter         Home Equipment: Rolling  Walker (2 wheels)      Prior Function Prior Level of Function : Independent/Modified Independent             Mobility Comments: with rolling walker. drives occasionally       Extremity/Trunk Assessment   Upper Extremity Assessment Upper Extremity Assessment: Generalized weakness    Lower Extremity  Assessment Lower Extremity Assessment: Generalized weakness       Communication   Communication Communication: Impaired Factors Affecting Communication: Difficulty expressing self;Reduced clarity of speech    Cognition Arousal: Alert Behavior During Therapy: Restless, Anxious   PT - Cognitive impairments: Difficult to assess Difficult to assess due to: Impaired communication                     PT - Cognition Comments: patient moaning out (does not appear to be in distress and reports no to pain). she has difficulty communicating Following commands: Impaired Following commands impaired: Follows one step commands with increased time     Cueing Cueing Techniques: Verbal cues, Gestural cues, Tactile cues, Visual cues     General Comments General comments (skin integrity, edema, etc.): supportive daughter in law at bedside    Exercises     Assessment/Plan    PT Assessment Patient needs continued PT services  PT Problem List Decreased strength;Decreased range of motion;Decreased activity tolerance;Decreased balance;Decreased mobility;Decreased cognition;Decreased safety awareness       PT Treatment Interventions Stair training;DME instruction;Gait training;Functional mobility training;Therapeutic exercise;Therapeutic activities;Patient/family education;Cognitive remediation;Neuromuscular re-education;Balance training;Wheelchair mobility training    PT Goals (Current goals can be found in the Care Plan section)  Acute Rehab PT Goals Patient Stated Goal: to go back home PT Goal Formulation: With family Time For Goal Achievement: 07/03/24 Potential to Achieve Goals: Fair    Frequency Min 2X/week     Co-evaluation PT/OT/SLP Co-Evaluation/Treatment: Yes Reason for Co-Treatment: Complexity of the patient's impairments (multi-system involvement);To address functional/ADL transfers PT goals addressed during session: Mobility/safety with mobility         AM-PAC  PT 6 Clicks Mobility  Outcome Measure Help needed turning from your back to your side while in a flat bed without using bedrails?: A Lot Help needed moving from lying on your back to sitting on the side of a flat bed without using bedrails?: Total Help needed moving to and from a bed to a chair (including a wheelchair)?: Total Help needed standing up from a chair using your arms (e.g., wheelchair or bedside chair)?: Total Help needed to walk in hospital room?: Total Help needed climbing 3-5 steps with a railing? : Total 6 Click Score: 7    End of Session   Activity Tolerance: Patient limited by fatigue;Patient tolerated treatment well Patient left: in bed;with call bell/phone within reach;with bed alarm set Nurse Communication: Mobility status PT Visit Diagnosis: Muscle weakness (generalized) (M62.81);Unsteadiness on feet (R26.81)    Time: 1340-1403 PT Time Calculation (min) (ACUTE ONLY): 23 min   Charges:   PT Evaluation $PT Eval Moderate Complexity: 1 Mod   PT General Charges $$ ACUTE PT VISIT: 1 Visit         Randine Essex, PT, MPT   Randine LULLA Essex 06/19/2024, 2:21 PM

## 2024-06-19 NOTE — Progress Notes (Signed)
 Nutrition Follow-up  DOCUMENTATION CODES:   Morbid obesity  INTERVENTION:   -RD will follow for diet advancement and add supplements as tolerated -If patient remains unable to safely take PO diet, consider initial of enteral nutrition support if this aligns with patient's goals of care  NUTRITION DIAGNOSIS:   Inadequate oral intake related to inability to eat (pt sedated and ventilated) as evidenced by NPO status.  Ongoing  GOAL:   Patient will meet greater than or equal to 90% of their needs  Unmet  MONITOR:   Diet advancement  REASON FOR ASSESSMENT:   Ventilator    ASSESSMENT:   73 y/o female with h/o morbid obesity , chronic pain, Afib and CHF who is admitted with PNA, septic shock and GIB secondary to [facility] Weiss tear.  11/24- intubated 11/25- s/p EGD- revealed mallory weiss tear 11/27- extubated  Reviewed I/O's: -1.7 L x 24 hours and +2.5 L since admission  UOP: 2.2 L x 24 hours   Per PCCM, patient is full code. Family agreeable to trial of extubation (extubated 11/27). Patient no longer requiring pressors or sedation, but remains on amiodarone .   Patient sitting up in bed at time of visit. Patient moaning at time of visit. She was unable to answer RD questions. No family at bedside to provide additional history.   Case discussed with RN, who confirms NPO and no plans to start nutrition today.   Weight has ranged from 127.9 kg-140.6 kg since admission. Weight has been stable since last visit (06/17/24).   Medications reviewed and include amiodarone , solu-medrol , and protonix .   Labs reviewed: K: 3.1 (on IV supplementation), CBGS: 112-139 (inpatient orders for glycemic control are 0-5 units insulin  aspart daily at bedtime and 0-9 units insulin  aspart TID with meals).    NUTRITION - FOCUSED PHYSICAL EXAM:  Flowsheet Row Most Recent Value  Orbital Region No depletion  Upper Arm Region No depletion  Thoracic and Lumbar Region No depletion  Buccal  Region No depletion  Temple Region No depletion  Clavicle Bone Region No depletion  Clavicle and Acromion Bone Region No depletion  Scapular Bone Region No depletion  Dorsal Hand No depletion  Patellar Region No depletion  Anterior Thigh Region No depletion  Posterior Calf Region No depletion  Edema (RD Assessment) Severe  Hair Reviewed  Eyes Reviewed  Mouth Reviewed  Skin Reviewed  Nails Reviewed    Diet Order:   Diet Order             Diet NPO time specified  Diet effective now                   EDUCATION NEEDS:   Not appropriate for education at this time  Skin:  Skin Assessment: Reviewed RN Assessment  Last BM:  Unknown  Height:   Ht Readings from Last 1 Encounters:  06/15/24 5' 3 (1.6 m)    Weight:   Wt Readings from Last 1 Encounters:  06/19/24 (!) 138.3 kg    Ideal Body Weight:  52.2 kg  BMI:  Body mass index is 54.01 kg/m.  Estimated Nutritional Needs:   Kcal:  1800-2000  Protein:  105-120 grams  Fluid:  1.8-2.0 L    Margery ORN, RD, [name], CDCES Registered Dietitian III Certified Diabetes Care and Education Specialist If unable to reach this RD, please use RD Inpatient group chat on secure chat between hours of 8am-4 pm daily

## 2024-06-19 NOTE — Evaluation (Signed)
 Occupational Therapy Evaluation Patient Details Name: [name] MRN: [id number] DOB: Aug 06, 1950 Today's Date: 06/19/2024   History of Present Illness   Patient is a 73 year old female with acute hypoxic respiratory failure s/t suspected aspiration pneumonia in the setting of suspected acute upper GIB requiring vent support. Extubated 11/27. PMH: migraines, atrial fibrillation.   Clinical Impressions [name] was seen for OT evaluation this date. Prior to hospital admission, pt was MOD I using RW. Pt lives with grandson. Pt currently requires MAX A hand over hand oral care at bed level. MAX A x2 sup<>sit and sit<>stand at EOB x2 trials. Pt minimally conversive and moaning t/o session, denies localized pain. Pt would benefit from skilled OT to address noted impairments and functional limitations (see below for any additional details). Upon hospital discharge, recommend OT follow up <3 hours/day.    If plan is discharge home, recommend the following:   Two people to help with walking and/or transfers;Two people to help with bathing/dressing/bathroom     Functional Status Assessment   Patient has had a recent decline in their functional status and demonstrates the ability to make significant improvements in function in a reasonable and predictable amount of time.     Equipment Recommendations   Hospital bed     Recommendations for Other Services         Precautions/Restrictions   Precautions Precautions: Fall Recall of Precautions/Restrictions: Intact Restrictions Weight Bearing Restrictions Per Provider Order: No     Mobility Bed Mobility Overal bed mobility: Needs Assistance Bed Mobility: Supine to Sit, Sit to Supine     Supine to sit: Max assist, +2 for physical assistance Sit to supine: Max assist, +2 for physical assistance        Transfers Overall transfer level: Needs assistance Equipment used: 2 person hand held assist Transfers: Sit to/from  Stand Sit to Stand: Max assist, +2 physical assistance                  Balance Overall balance assessment: Needs assistance Sitting-balance support: Feet supported Sitting balance-Leahy Scale: Fair     Standing balance support: Bilateral upper extremity supported Standing balance-Leahy Scale: Zero                             ADL either performed or assessed with clinical judgement   ADL Overall ADL's : Needs assistance/impaired                                       General ADL Comments: MAX A hand over hand oral care at bed level. MAX A x2 don B socks seated EOB      Pertinent Vitals/Pain Pain Assessment Pain Assessment: CPOT Facial Expression: Relaxed, neutral Body Movements: Absence of movements Muscle Tension: Relaxed Compliance with ventilator (intubated pts.): N/A Vocalization (extubated pts.): Sighing, moaning CPOT Total: 1     Extremity/Trunk Assessment Upper Extremity Assessment Upper Extremity Assessment: Generalized weakness   Lower Extremity Assessment Lower Extremity Assessment: Generalized weakness       Communication Communication Communication: Impaired Factors Affecting Communication: Difficulty expressing self;Reduced clarity of speech   Cognition Arousal: Alert Behavior During Therapy: Restless, Anxious Cognition: Difficult to assess Difficult to assess due to: Impaired communication, Level of arousal           OT - Cognition Comments: eyes largely closed t/o session  and continual moaning, does calm to reassurances and attempts 2 words in response to questions                 Following commands: Impaired Following commands impaired: Follows one step commands with increased time     Cueing  General Comments   Cueing Techniques: Verbal cues;Gestural cues;Tactile cues;Visual cues  supportive daughter in law at bedside   Exercises     Shoulder Instructions      Home Living Family/patient  expects to be discharged to:: Private residence Living Arrangements: Other relatives Available Help at Discharge: Family Type of Home: House Home Access: Stairs to enter Secretary/administrator of Steps: 1+1 Entrance Stairs-Rails: None Home Layout: One level               Home Equipment: Agricultural Consultant (2 wheels)          Prior Functioning/Environment Prior Level of Function : Independent/Modified Independent             Mobility Comments: with rolling walker. drives occasionally      OT Problem List: Decreased strength;Decreased range of motion;Decreased activity tolerance;Impaired balance (sitting and/or standing);Decreased safety awareness   OT Treatment/Interventions: Self-care/ADL training;Therapeutic exercise;Energy conservation;DME and/or AE instruction;Therapeutic activities;Patient/family education;Balance training      OT Goals(Current goals can be found in the care plan section)   Acute Rehab OT Goals Patient Stated Goal: to go home OT Goal Formulation: With patient/family Time For Goal Achievement: 07/03/24 Potential to Achieve Goals: Fair ADL Goals Pt Will Perform Grooming: with min assist;sitting Pt Will Perform Lower Body Dressing: with min assist;sitting/lateral leans Pt Will Transfer to Toilet: with min assist;ambulating;bedside commode   OT Frequency:  Min 2X/week    Co-evaluation PT/OT/SLP Co-Evaluation/Treatment: Yes Reason for Co-Treatment: Complexity of the patient's impairments (multi-system involvement);To address functional/ADL transfers PT goals addressed during session: Mobility/safety with mobility OT goals addressed during session: ADL's and self-care      AM-PAC OT 6 Clicks Daily Activity     Outcome Measure Help from another person eating meals?: A Lot Help from another person taking care of personal grooming?: A Lot Help from another person toileting, which includes using toliet, bedpan, or urinal?: A Lot Help from  another person bathing (including washing, rinsing, drying)?: A Lot Help from another person to put on and taking off regular upper body clothing?: A Lot Help from another person to put on and taking off regular lower body clothing?: A Lot 6 Click Score: 12   End of Session Nurse Communication: Mobility status  Activity Tolerance: Patient limited by fatigue Patient left: in bed;with call bell/phone within reach;with family/visitor present  OT Visit Diagnosis: Other abnormalities of gait and mobility (R26.89);Muscle weakness (generalized) (M62.81)                Time: 1340-1403 OT Time Calculation (min): 23 min Charges:  OT General Charges $OT Visit: 1 Visit OT Evaluation $OT Eval Moderate Complexity: 1 Mod OT Treatments $Self Care/Home Management : 8-22 mins  Elston Slot, M.S. OTR/L  06/19/24, 2:38 PM  ascom [phone]

## 2024-06-19 NOTE — Consult Note (Signed)
 PHARMACY CONSULT NOTE - ELECTROLYTES  Pharmacy Consult for Electrolyte Monitoring and Replacement   Recent Labs: Height: 5' 3 (160 cm) Weight: (!) 138.3 kg (304 lb 14.3 oz) IBW/kg (Calculated) : 52.4 Estimated Creatinine Clearance: 60.1 mL/min (A) (by C-G formula based on SCr of 1.16 mg/dL (H)). Potassium (mmol/L)  Date Value  06/19/2024 3.1 (L)   Magnesium  (mg/dL)  Date Value  88/72/7974 2.1   Calcium (mg/dL)  Date Value  88/71/7974 8.4 (L)   Albumin (g/dL)  Date Value  88/71/7974 3.7  10/25/2021 4.2   Phosphorus (mg/dL)  Date Value  88/71/7974 2.9   Sodium (mmol/L)  Date Value  06/19/2024 140  10/25/2021 144    Assessment  [name] is a 73 y.o. female presenting with hematemesis. PMH significant for pulmonary hypertension, thrombocytopenia, hypertension, CAD, polyneuropathy, atrial fibrillation, cardiomyopathy. Pharmacy has been consulted to monitor and replace electrolytes.  Diet: NPO, intubated MIVF: N/A Pertinent medications: norepinephrine  gtt, phenylephrine  gtt, vasopressin  gtt  Goal of Therapy: Electrolytes WNL  Plan:  K 3.3: Kcl 10mEq IV x 4 ordered by CCNP Check BMP, Mg, Phos with AM labs  Thank you for allowing pharmacy to be a part of this patient's care.  Sultan Pargas A Chastelyn Athens, PharmD Clinical Pharmacist 06/19/2024 7:42 AM

## 2024-06-20 ENCOUNTER — Inpatient Hospital Stay

## 2024-06-20 LAB — BASIC METABOLIC PANEL WITH GFR
Anion gap: 12 (ref 5–15)
BUN: 41 mg/dL — ABNORMAL HIGH (ref 8–23)
CO2: 29 mmol/L (ref 22–32)
Calcium: 9 mg/dL (ref 8.9–10.3)
Chloride: 104 mmol/L (ref 98–111)
Creatinine, Ser: 1.04 mg/dL — ABNORMAL HIGH (ref 0.44–1.00)
GFR, Estimated: 57 mL/min — ABNORMAL LOW (ref >60)
Glucose, Bld: 122 mg/dL — ABNORMAL HIGH (ref 70–99)
Potassium: 3.9 mmol/L (ref 3.5–5.1)
Sodium: 145 mmol/L (ref 135–145)

## 2024-06-20 LAB — CULTURE, BLOOD (ROUTINE X 2): Culture: NO GROWTH

## 2024-06-20 LAB — RENAL FUNCTION PANEL
Albumin: 3.8 g/dL (ref 3.5–5.0)
Anion gap: 12 (ref 5–15)
BUN: 35 mg/dL — ABNORMAL HIGH (ref 8–23)
CO2: 29 mmol/L (ref 22–32)
Calcium: 8.5 mg/dL — ABNORMAL LOW (ref 8.9–10.3)
Chloride: 106 mmol/L (ref 98–111)
Creatinine, Ser: 1.04 mg/dL — ABNORMAL HIGH (ref 0.44–1.00)
GFR, Estimated: 57 mL/min — ABNORMAL LOW (ref >60)
Glucose, Bld: 128 mg/dL — ABNORMAL HIGH (ref 70–99)
Phosphorus: 2.4 mg/dL — ABNORMAL LOW (ref 2.5–4.6)
Potassium: 3.4 mmol/L — ABNORMAL LOW (ref 3.5–5.1)
Sodium: 146 mmol/L — ABNORMAL HIGH (ref 135–145)

## 2024-06-20 LAB — LACTIC ACID, PLASMA: Lactic Acid, Venous: 1.2 mmol/L (ref 0.5–1.9)

## 2024-06-20 LAB — CBC
HCT: 32.6 % — ABNORMAL LOW (ref 36.0–46.0)
Hemoglobin: 10.5 g/dL — ABNORMAL LOW (ref 12.0–15.0)
MCH: 31.2 pg (ref 26.0–34.0)
MCHC: 32.2 g/dL (ref 30.0–36.0)
MCV: 96.7 fL (ref 80.0–100.0)
Platelets: 106 K/uL — ABNORMAL LOW (ref 150–400)
RBC: 3.37 MIL/uL — ABNORMAL LOW (ref 3.87–5.11)
RDW: 14 % (ref 11.5–15.5)
WBC: 9 K/uL (ref 4.0–10.5)
nRBC: 0.3 % — ABNORMAL HIGH (ref 0.0–0.2)

## 2024-06-20 LAB — GLUCOSE, CAPILLARY
Glucose-Capillary: 121 mg/dL — ABNORMAL HIGH (ref 70–99)
Glucose-Capillary: 132 mg/dL — ABNORMAL HIGH (ref 70–99)
Glucose-Capillary: 125 mg/dL — ABNORMAL HIGH (ref 70–99)
Glucose-Capillary: 124 mg/dL — ABNORMAL HIGH (ref 70–99)

## 2024-06-20 LAB — PRO BRAIN NATRIURETIC PEPTIDE: Pro Brain Natriuretic Peptide: 6725 pg/mL — ABNORMAL HIGH (ref <300.0)

## 2024-06-20 LAB — MAGNESIUM: Magnesium: 2.7 mg/dL — ABNORMAL HIGH (ref 1.7–2.4)

## 2024-06-20 MED ORDER — PNEUMOCOCCAL 20-VAL CONJ VACC 0.5 ML IM SUSY: 0.5000 mL | PREFILLED_SYRINGE | INTRAMUSCULAR | Status: DC | PRN

## 2024-06-20 MED ORDER — DIPHENHYDRAMINE HCL 50 MG/ML IJ SOLN
25.0000 mg | Freq: Once | INTRAMUSCULAR | Status: AC
  Administered 2024-06-20: 25 mg via INTRAVENOUS
  Filled 2024-06-20: qty 1

## 2024-06-20 MED ORDER — FUROSEMIDE 10 MG/ML IJ SOLN
40.0000 mg | Freq: Once | INTRAMUSCULAR | Status: AC
  Administered 2024-06-20: 40 mg via INTRAVENOUS
  Filled 2024-06-20: qty 4

## 2024-06-20 MED ORDER — METHYLPREDNISOLONE SODIUM SUCC 125 MG IJ SOLR
125.0000 mg | Freq: Once | INTRAMUSCULAR | Status: AC
  Administered 2024-06-20: 125 mg via INTRAVENOUS
  Filled 2024-06-20: qty 2

## 2024-06-20 MED ORDER — FAMOTIDINE IN NACL 20-0.9 MG/50ML-% IV SOLN
20.0000 mg | Freq: Once | INTRAVENOUS | Status: AC
  Administered 2024-06-20: 20 mg via INTRAVENOUS
  Filled 2024-06-20: qty 50

## 2024-06-20 MED ORDER — HYDROMORPHONE HCL 1 MG/ML IJ SOLN
0.5000 mg | INTRAMUSCULAR | Status: DC | PRN
  Administered 2024-06-20 (×2): 0.5 mg via INTRAVENOUS
  Filled 2024-06-20 (×2): qty 1

## 2024-06-20 MED ORDER — HYDROMORPHONE HCL 1 MG/ML IJ SOLN
1.0000 mg | INTRAMUSCULAR | Status: DC | PRN
  Administered 2024-06-21 – 2024-06-23 (×14): 1 mg via INTRAVENOUS
  Filled 2024-06-20 (×14): qty 1

## 2024-06-20 MED ORDER — POTASSIUM PHOSPHATES 15 MMOLE/5ML IV SOLN
20.0000 mmol | Freq: Once | INTRAVENOUS | Status: AC
  Administered 2024-06-20: 20 mmol via INTRAVENOUS
  Filled 2024-06-20: qty 6.67

## 2024-06-20 NOTE — Evaluation (Signed)
 Speech Language Pathology Evaluation Patient Details Name: [name] MRN: [id number] DOB: 20-Dec-1950 Today's Date: 06/20/2024 Time: 1030-1041 SLP Time Calculation (min) (ACUTE ONLY): 11 min  Problem List:  Patient Active Problem List   Diagnosis Date Noted   Septic shock (HCC) 06/16/2024   Hematemesis 06/16/2024   Mallory-Weiss tear 06/16/2024   GIB (gastrointestinal bleeding) 06/15/2024   Atelectasis 09/04/2021   Atrial fibrillation (HCC) 08/24/2020   Pneumonia due to COVID-19 virus 08/24/2020   (HFpEF) heart failure with preserved ejection fraction (HCC) 08/24/2020   Viral URI 08/03/2020   Postherpetic neuralgia 03/29/2020   Grief 03/29/2020   Cough 03/29/2020   Encounter for screening colonoscopy    Elevated blood pressure reading 01/19/2020   Osteoarthritis of right knee 12/21/2019   Morbid obesity due to excess calories (HCC) 08/13/2015   Chronic pain of right knee 08/13/2015   Past Medical History:  Past Medical History:  Diagnosis Date   History of shingles    Knee pain    Migraines    Past Surgical History:  Past Surgical History:  Procedure Laterality Date   COLONOSCOPY WITH PROPOFOL  N/A 02/19/2020   Procedure: COLONOSCOPY WITH PROPOFOL ;  Surgeon: Janalyn Keene NOVAK, MD;  Location: [facility] ENDOSCOPY;  Service: Endoscopy;  Laterality: N/A;   ESOPHAGOGASTRODUODENOSCOPY N/A 06/16/2024   Procedure: EGD (ESOPHAGOGASTRODUODENOSCOPY);  Surgeon: Jinny Carmine, MD;  Location: [facility] ENDOSCOPY;  Service: Endoscopy;  Laterality: N/A;   REPLACEMENT TOTAL KNEE     left knee   TUBAL LIGATION     HPI:  Patient is a 73 year old female with acute hypoxic respiratory failure s/t suspected aspiration pneumonia in the setting of suspected acute upper GIB requiring vent support. Intubated 11/24-27. PMH: migraines, atrial fibrillation.   Assessment / Plan / Recommendation Clinical Impression  Pt seen for limited cognitive linguistic evaluation in the setting of Acute Metabolic  Encephalopathy- Head CT 06/15/24 negative for intracranial abnormality. Assessment consisting of pt interview and completion of dynamic measures. Pt with limited attention/engagement with therapist, expressing need for water upon therapist entry to room, without additional verbal utterances. Pt moaning in response intermittently to verbal prompts for orientation. No attempt at command following or sustaining attention to therapist/task. Following 5 min of session, pt closing eyes. Verbal/tactile cues were not effective in increasing alertness/response.   Despite severity of AMS, based on lack of acute neuro finding/history of cognitive decline and current mental status being attributed to encephalopathy, suspect improved cognitive ability with time and medical management. Plan to attempt follow up x1 with pt family for compensatory strategy training and education. No follow up SLP services expected at discharge. RN aware of plan.      SLP Assessment  SLP Recommendation/Assessment: Patient needs continued Speech Language Pathology Services SLP Visit Diagnosis: Cognitive communication deficit (R41.841) (in the setting of encephalopathy)     Assistance Recommended at Discharge  Frequent or constant Supervision/Assistance  Functional Status Assessment Patient has had a recent decline in their functional status and demonstrates the ability to make significant improvements in function in a reasonable and predictable amount of time.  Frequency and Duration min 1 x/week  1 week      SLP Evaluation Cognition  Overall Cognitive Status: Difficult to assess Arousal/Alertness: Lethargic Orientation Level: Oriented to person;Disoriented to time;Disoriented to place;Disoriented to situation (with yes no question) Attention: Sustained Sustained Attention: Impaired Sustained Attention Impairment: Verbal basic Memory:  (not assessed) Awareness: Impaired Awareness Impairment: Intellectual  impairment Problem Solving:  (not assessed) Safety/Judgment: Impaired  Comprehension  Auditory Comprehension Overall Auditory Comprehension: Impaired Commands: Impaired One Step Basic Commands: 0-24% accurate Conversation: Simple (impaired) Interfering Components: Attention;Processing speed EffectiveTechniques: Extra processing time Visual Recognition/Discrimination Discrimination: Not tested Reading Comprehension Reading Status: Not tested    Expression Expression Primary Mode of Expression: Verbal Verbal Expression Overall Verbal Expression: Impaired Level of Generative/Spontaneous Verbalization:  (minimal spontaneous utterances) Interfering Components: Attention Non-Verbal Means of Communication: Not applicable Written Expression Written Expression: Not tested   Oral / Motor  Oral Motor/Sensory Function Overall Oral Motor/Sensory Function: Within functional limits Motor Speech Overall Motor Speech:  (difficult to assess- speech with minimal effort for articulation precision) Articulation: Impaired Level of Impairment: Word Intelligibility: Intelligibility reduced Word: 0-24% accurate           Venson Ferencz Clapp, MS, CCC-SLP Speech Language Pathologist Rehab Services; [facility] [phone] (ascom)   Purvi Ruehl J Clapp 06/20/2024, 12:40 PM

## 2024-06-20 NOTE — Progress Notes (Signed)
 NAME:  [name], MRN:  [id number], DOB:  10/01/1950, LOS: 5 ADMISSION DATE:  06/15/2024, CONSULTATION DATE:  06/15/24 REFERRING MD:  Dr. Dorothyann, CHIEF COMPLAINT:  Hematemesis, Multifocal pneumonia   History of Present Illness:  [name] is a 73 year old who presented to [facility] ED from home for evaluation of hematemesis.   History obtained per chart review and family bedside report. Patient was in her normal state of health until 11/21. Family reported she started feeling poorly with a cough, they denied congestion, complaints of dyspnea or chest pain. Then starting on 11/24 the patient began feeling nauseous and having hematemesis. Family denied any fever/ chills or any other complaints. And no previous episodes of hematemesis or GI bleeding that family is aware of. Patient is documented as complaining of abdominal pain, but family denies this. Family reports patient was on Eliquis  due to Chronic A-fib but stopped a year ago due to financial constraints. She had a recent PCP appointment and was expecting to hear from someone to assist her in obtaining medication, but hadn't heard anything yet. Family also reports some non-compliance with medication, that she tries not to take medicine if she doesn't need it. They deny any tobacco use/ ETOH use or recreational drug use history.    CXR 06/15/24: Interval placement of an endotracheal tube with the tip measuring 3.1 cm above the carina and an enteric tube with the tip off the field of view but below the left hemidiaphragm.2. Airspace infiltrates in the right perihilar region and throughout the left lung, similar to the prior study, which may represent pneumonia, edema, or aspiration. 3. Cardiac enlargement.   CT chest wo contrast 06/15/24: . Extensive bilateral airspace disease, most pronounced throughout the left lung and in the right lower lobe, concerning for pneumonia. 2. Trace bilateral pleural effusions. 3. Cardiomegaly, coronary  artery disease. 4. Aortic atherosclerosis.   CT angio GIB protocol 06/15/24:  No active GI bleeding. 2. No occlusion or hemodynamically significant stenosis of the abdominal or pelvic arterial system. No abdominal aortic aneurysm or dissection. 3. Presacral perirectal 6.5 x 6.2 cm fluid collection, minimally changed since 2012 and likely benign. 4. Moderate-sized umbilical hernia containing fat and a small amount of fluid, increased in size since 2012   PCCM consulted for admission due to acute hypoxic respiratory failure s/t suspected aspiration pneumonia in the setting of suspected acute upper GIB on mechanical ventilatory support.  Pertinent  Medical History  Pulmonary Hypertension Thrombocytopenia Hypertension CAD Polyneuropathy PAF - Previously on Eliquis  but stopped last year d/t financial constraints Cardiomyopathy (ECHO 07/2020 LVEF 60-65%; severe LA dilatation, mild RA dilatation, borderline pulmonary hypertension w/ PASP )  Significant Hospital Events: Including procedures, antibiotic start and stop dates in addition to other pertinent events   06/15/24: Admit to ICU due to acute hypoxic respiratory failure s/t suspected aspiration pneumonia in the setting of suspected acute upper GIB on mechanical ventilatory support. 06/16/24: Remains critically ill requiring mechanical ventilation with increasing pressor requirements. Central line and arterial line placed. GI following; plan for EGD today. 06/17/24: EGD found evidence of Mallory Weiss tear without active bleeding. Remains critically ill requiring mechanical ventilation. Vent requirements decreasing. Pressor requirements coming down. Cardiology following. 06/18/24: plan for wake-up assessment today 06/19/24: Extubated yesterday to HFNC. No overnight events. Remains on amiodarone . No longer requiring pressors or sedation. Intermittent confusion. Satting well on room air.  06/20/24: Off Levophed , remains on Amiodarone  gtt    Interim History / Subjective:  See above  listed under significant hospital events.  Objective    Blood pressure (!) 122/46, pulse 64, temperature 98.6 F (37 C), temperature source Oral, resp. rate 16, height 5' 3 (1.6 m), weight 134.8 kg, SpO2 100%. CVP:  [33 mmHg] 33 mmHg  FiO2 (%):  [32 %-55 %] 45 %   Intake/Output Summary (Last 24 hours) at 06/20/2024 0606 Last data filed at 06/20/2024 0000 Gross per 24 hour  Intake 2026.37 ml  Output 1700 ml  Net 326.37 ml   Filed Weights   06/18/24 0500 06/19/24 0458 06/20/24 0500  Weight: (!) 140.6 kg (!) 138.3 kg 134.8 kg   Examination: General: ill appearing female, resting in bed with intermittent agitation in NAD HENT: atraumatic, normocephalic, supple, no JVD Lungs: mildly diminished bilaterally, equal throughout, mild wheezing and rhonci Cardiovascular: irregularly irregular, S1 S2, no m/r/g Abdomen: soft, non-tender, non-distended, no rebound/guarding, bowel sounds x 4 Extremities: normal bulk and tone, warm, dry, radial pulses 2+, distal 1+, UE edema Neuro: oriented to self only, follows commands with redirection, no focal neuro deficits, PERRL GU: foley draining clear yellow urine  Resolved problem list   Assessment and Plan  #Chronic/Paroxysmal Atrial Fibrillation with RVR #Acute Decompensated Heart Failure  #Mildly Elevated Troponin likely Demand Ischemia iso above Hx: Hypertension, PAF, Pulmonary Hypertension, Cardiomyopathy 06/16/24 ECHO: -Euvolemic on exam.  -BNP (): 5,289 -Chest XR (): No acute chest findings  -ECHO (06/16/24): LVEF 60-65% with grade II diastolic dysfunction; moderately elevated pulmonary artery systolic pressure; tricuspid regurgitation with right atrial pressure of . -Hold Home GDMT of: metoprolol , torsemide  PRN -Strict I/Os -Furosemide  IV diureses >1L negative per day until approach euvolemia / worsening renal function. -Hold beta-blockers during decompensation -Vasopressors to  maintain MAP >65 ~ weaned off all pressors -Continue amiodarone  gtt -Holding anticoagulation iso GI Bleed -Cardiology following; appreciate input   #Acute Hypoxic Respiratory Failure s/t aspiration in the setting of refractory vomiting and Multifocal Pneumonia ~ Resolved 06/15/24 CT Chest wo contrast: extensive bilateral airspace disease, mostly throughout left lung and in right lower lobe; concern for pneumonia; trace bilateral pleural effusions; cardiomegaly, CAD. -Extubated to HFNC 06/18/24 -Supplemental oxygen to maintain O2 sats >88% -Duonebs prn and Solumedrol 20mg  Q12h -Intermittent CXR and ABG    #Acute Upper GI Bleed s/t Mallory Weiss Tear ~ Resolved #Acute Blood Loss Anemia s/t Mallory Weiss Tear ~ Resolved #Thrombocytopenia 06/15/24 CTA GI Bleed: no active GI bleed; no occlusion or HD significant stenosis of abdominal or pelvic arterial system. No AAA or dissection.  -EGD: evidence of mallory weiss tear without signs of active bleeding -GI signed off -Trend CBC -Transfuse if Hgb < 7; Hgb trending down at 9.9 -Monitor for s/sx of bleeding -DVT prophylaxis: SCDs; will need AC at some point   #Hyperglycemia w/o dx of Type II Diabetes Mellitus - ICU hypo/hyperglycemia protocol - Range 140-180s - CBG Q4h - SSI   #Sepsis due to Aspiratio9n Pneumonia -F/u cultures, trend lactic/ PCT -Monitor WBC/ fever curve -Obtain MRSA nasal swab, RVP, legionella, strep urine Ag negaive -continue IV antibiotics C -Gentle IVF hydration as needed -Pressors PRN for MAP goal >65   #AKI likely ATN in the setting of above #Hypokalemia -Trend Lactate -Monitor I&O's / urinary output -Follow BMP -Ensure adequate renal perfusion -Avoid nephrotoxic agents as able -Replace electrolytes as indicated  #Acute Metabolic Encephalopathy s/t Septic Shock d/t Multifocal Pneumonia  06/15/24 CT Head wo contrast: no acute intracranial abnormality - No longer requiring sedation - Encourage family  support at bedside - Maintain appropriate sleep  wake cycles   Labs   CBC: Recent Labs  Lab 06/16/24 0944 06/17/24 0330 06/18/24 0335 06/19/24 0250 06/20/24 0515  WBC 22.2* 24.9* 14.6* 8.6 9.0  NEUTROABS 16.5*  --   --   --   --   HGB 11.2* 11.0* 10.1* 9.9* 10.5*  HCT 34.3* 33.2* 30.0* 30.5* 32.6*  MCV 95.0 94.3 92.9 93.6 96.7  PLT 119* 108* 104* 107* 106*    Basic Metabolic Panel: Recent Labs  Lab 06/16/24 0440 06/17/24 0330 06/18/24 0335 06/18/24 0355 06/19/24 0250 06/19/24 1407 06/19/24 2000 06/20/24 0515  NA 137 134* 136  --  140  --   --  146*  K 3.6 3.9 3.3*  --  3.1* 3.2* 3.6 3.4*  CL 100 101 99  --  99  --   --  106  CO2 25 20* 23  --  27  --   --  29  GLUCOSE 95 132* 130*  --  123*  --   --  128*  BUN 16 26* 32*  --  34*  --   --  35*  CREATININE 1.01* 1.22* 1.12*  --  1.16*  --   --  1.04*  CALCIUM 8.5* 7.7* 8.0*  --  8.4*  --   --  8.5*  MG 1.8 1.6*  --  2.1  --  2.5*  --   --   PHOS 2.8 4.5 3.3  --  2.9  --   --  2.4*   GFR: Estimated Creatinine Clearance: 65.9 mL/min (A) (by C-G formula based on SCr of 1.04 mg/dL (H)). Recent Labs  Lab 06/15/24 2203 06/16/24 0440 06/16/24 0944 06/17/24 0330 06/18/24 0335 06/19/24 0250 06/20/24 0515  PROCALCITON  --  18.20  --  24.10  --   --   --   WBC  --   --    < > 24.9* 14.6* 8.6 9.0  LATICACIDVEN 1.5  --   --  1.9  --   --   --    < > = values in this interval not displayed.    Liver Function Tests: Recent Labs  Lab 06/15/24 2005 06/17/24 0330 06/18/24 0335 06/19/24 0250 06/20/24 0515  AST 23  --   --   --   --   ALT 6  --   --   --   --   ALKPHOS 76  --   --   --   --   BILITOT 1.4*  --   --   --   --   PROT 7.1  --   --   --   --   ALBUMIN 4.4 3.4* 3.4* 3.7 3.8   Recent Labs  Lab 06/15/24 2005  LIPASE 18   No results for input(s): AMMONIA in the last 168 hours.  ABG    Component Value Date/Time   PHART 7.47 (H) 06/18/2024 1237   PCO2ART 39 06/18/2024 1237   PO2ART 83 06/18/2024  1237   HCO3 28.4 (H) 06/18/2024 1237   ACIDBASEDEF 1.1 06/16/2024 0501   O2SAT 99.1 06/18/2024 1237     Coagulation Profile: Recent Labs  Lab 06/16/24 0443  INR 1.2    Cardiac Enzymes: No results for input(s): CKTOTAL, CKMB, CKMBINDEX, TROPONINI in the last 168 hours.  HbA1C: No results found for: HGBA1C  CBG: Recent Labs  Lab 06/18/24 2158 06/19/24 0728 06/19/24 1139 06/19/24 1603 06/19/24 2159  GLUCAP 112* 121* 124* 134* 124*    Review of Systems:  Denies chest pain, shortness of breath, headache, vision changes or abdominal pain.  Past Medical History:  She,  has a past medical history of History of shingles, Knee pain, and Migraines.   Surgical History:   Past Surgical History:  Procedure Laterality Date   COLONOSCOPY WITH PROPOFOL  N/A 02/19/2020   Procedure: COLONOSCOPY WITH PROPOFOL ;  Surgeon: Janalyn Keene NOVAK, MD;  Location: [facility] ENDOSCOPY;  Service: Endoscopy;  Laterality: N/A;   ESOPHAGOGASTRODUODENOSCOPY N/A 06/16/2024   Procedure: EGD (ESOPHAGOGASTRODUODENOSCOPY);  Surgeon: Jinny Carmine, MD;  Location: [facility] ENDOSCOPY;  Service: Endoscopy;  Laterality: N/A;   REPLACEMENT TOTAL KNEE     left knee   TUBAL LIGATION       Social History:   reports that she has never smoked. She has never used smokeless tobacco. She reports that she does not drink alcohol and does not use drugs.   Family History:  Her family history includes Cancer in her brother and father; Heart disease in her mother; Hypertension in her mother.   Allergies No Known Allergies   Home Medications  Prior to Admission medications   Medication Sig Start Date End Date Taking? Authorizing Provider  Acetaminophen  (TYLENOL  8 HOUR PO) Take by mouth as needed.   Yes [provider]  metoprolol  succinate (TOPROL -XL) 100 MG 24 hr tablet Take 1 tablet (100 mg total) by mouth in the morning and at bedtime. Take with or immediately following a meal. 08/25/21 06/18/24 Yes  Melvin Pao, NP  ondansetron  (ZOFRAN ) 4 MG tablet Take 1 tablet (4 mg total) by mouth every 8 (eight) hours as needed for nausea or vomiting. 08/10/20  Yes Rumball, Alison M, DO  pregabalin (LYRICA) 50 MG capsule Take 1 capsule by mouth 2 (two) times daily. 04/24/24  Yes [provider]  albuterol  (VENTOLIN  HFA) 108 (90 Base) MCG/ACT inhaler Inhale 1-2 puffs into the lungs every 6 (six) hours as needed for wheezing or shortness of breath. 01/25/21 01/25/22  Melvin Pao, NP  apixaban  (ELIQUIS ) 5 MG TABS tablet Take 1 tablet (5 mg total) by mouth 2 (two) times daily. Patient not taking: Reported on 06/18/2024 12/22/21   Melvin Pao, NP  gabapentin  (NEURONTIN ) 600 MG tablet Take 1 tablet (600 mg total) by mouth 3 (three) times daily as needed. Patient not taking: Reported on 06/16/2024 12/22/21   Melvin Pao, NP  Spacer/Aero-Holding [facility] DIAMOND) MISC by Does not apply route. 08/17/20   [provider]  torsemide  (DEMADEX ) 20 MG tablet TAKE 1 TABLET BY MOUTH EVERY DAY Patient not taking: Reported on 04/12/2022 12/08/21   Cindie Ole DASEN, MD  Scheduled Meds:  amiodarone   150 mg Intravenous Once   budesonide  (PULMICORT ) nebulizer solution  0.5 mg Nebulization BID   Chlorhexidine  Gluconate Cloth  6 each Topical Daily   insulin  aspart  0-5 Units Subcutaneous QHS   insulin  aspart  0-9 Units Subcutaneous TID WC   ipratropium-albuterol   3 mL Nebulization BID   methylPREDNISolone  (SOLU-MEDROL ) injection  20 mg Intravenous Q12H   mouth rinse  15 mL Mouth Rinse Q2H   pantoprazole  (PROTONIX ) IV  40 mg Intravenous Q12H   sodium chloride  flush  10-40 mL Intracatheter Q12H   Continuous Infusions:  acetaminophen  1,000 mg (06/20/24 0530)   amiodarone  30 mg/hr (06/19/24 1914)   ampicillin -sulbactam (UNASYN ) IV 3 g (06/20/24 0154)   doxycycline  (VIBRAMYCIN ) IV 100 mg (06/19/24 2212)   PRN Meds:.acetaminophen  **OR** acetaminophen , fentaNYL  (SUBLIMAZE )  injection, HYDROmorphone (DILAUDID) injection, mouth rinse, pneumococcal 20-valent conjugate vaccine, sodium chloride  flush  Critical care time: 55 minutes       Almarie Nose DNP, CCRN, FNP-C, AGACNP-BC Acute Care & Family Nurse Practitioner [facility] PCCM on call pager [phone]

## 2024-06-20 NOTE — Consult Note (Signed)
 PHARMACY CONSULT NOTE - ELECTROLYTES  Pharmacy Consult for Electrolyte Monitoring and Replacement   Recent Labs: Height: 5' 3 (160 cm) Weight: 134.8 kg (297 lb 2.9 oz) IBW/kg (Calculated) : 52.4 Estimated Creatinine Clearance: 65.9 mL/min (A) (by C-G formula based on SCr of 1.04 mg/dL (H)). Potassium (mmol/L)  Date Value  06/20/2024 3.4 (L)   Magnesium  (mg/dL)  Date Value  88/70/7974 2.7 (H)   Calcium (mg/dL)  Date Value  88/70/7974 8.5 (L)   Albumin (g/dL)  Date Value  88/70/7974 3.8  10/25/2021 4.2   Phosphorus (mg/dL)  Date Value  88/70/7974 2.4 (L)   Sodium (mmol/L)  Date Value  06/20/2024 146 (H)  10/25/2021 144    Assessment  [name] is a 73 y.o. female presenting with hematemesis. PMH significant for pulmonary hypertension, thrombocytopenia, hypertension, CAD, polyneuropathy, atrial fibrillation, cardiomyopathy. Pharmacy has been consulted to monitor and replace electrolytes.  Goal of Therapy: Electrolytes WNL  Plan:  20 mmol IV potassium phosphate x 1 Check BMP, Mg, Phos with AM labs  Thank you for allowing pharmacy to be a part of this patient's care.  [name], PharmD Clinical Pharmacist 06/20/2024 6:33 AM

## 2024-06-20 NOTE — Plan of Care (Signed)
  Problem: Respiratory: Goal: Ability to maintain a clear airway and adequate ventilation will improve Outcome: Progressing   Problem: Role Relationship: Goal: Method of communication will improve Outcome: Progressing   

## 2024-06-21 LAB — GLUCOSE, CAPILLARY
Glucose-Capillary: 112 mg/dL — ABNORMAL HIGH (ref 70–99)
Glucose-Capillary: 117 mg/dL — ABNORMAL HIGH (ref 70–99)
Glucose-Capillary: 101 mg/dL — ABNORMAL HIGH (ref 70–99)
Glucose-Capillary: 126 mg/dL — ABNORMAL HIGH (ref 70–99)

## 2024-06-21 LAB — CBC
HCT: 34.6 % — ABNORMAL LOW (ref 36.0–46.0)
Hemoglobin: 10.8 g/dL — ABNORMAL LOW (ref 12.0–15.0)
MCH: 30.9 pg (ref 26.0–34.0)
MCHC: 31.2 g/dL (ref 30.0–36.0)
MCV: 99.1 fL (ref 80.0–100.0)
Platelets: 105 K/uL — ABNORMAL LOW (ref 150–400)
RBC: 3.49 MIL/uL — ABNORMAL LOW (ref 3.87–5.11)
RDW: 14.1 % (ref 11.5–15.5)
WBC: 9.2 K/uL (ref 4.0–10.5)
nRBC: 0.5 % — ABNORMAL HIGH (ref 0.0–0.2)

## 2024-06-21 LAB — RENAL FUNCTION PANEL
Albumin: 4.1 g/dL (ref 3.5–5.0)
Anion gap: 12 (ref 5–15)
BUN: 42 mg/dL — ABNORMAL HIGH (ref 8–23)
CO2: 30 mmol/L (ref 22–32)
Calcium: 9.1 mg/dL (ref 8.9–10.3)
Chloride: 103 mmol/L (ref 98–111)
Creatinine, Ser: 1.08 mg/dL — ABNORMAL HIGH (ref 0.44–1.00)
GFR, Estimated: 54 mL/min — ABNORMAL LOW (ref >60)
Glucose, Bld: 130 mg/dL — ABNORMAL HIGH (ref 70–99)
Phosphorus: 3.4 mg/dL (ref 2.5–4.6)
Potassium: 4 mmol/L (ref 3.5–5.1)
Sodium: 146 mmol/L — ABNORMAL HIGH (ref 135–145)

## 2024-06-21 LAB — MAGNESIUM: Magnesium: 3 mg/dL — ABNORMAL HIGH (ref 1.7–2.4)

## 2024-06-21 MED ORDER — IPRATROPIUM-ALBUTEROL 0.5-2.5 (3) MG/3ML IN SOLN: 3.0000 mL | Freq: Four times a day (QID) | RESPIRATORY_TRACT | Status: DC | PRN

## 2024-06-21 MED ORDER — BISACODYL 10 MG RE SUPP
10.0000 mg | Freq: Once | RECTAL | Status: AC
  Administered 2024-06-21: 10 mg via RECTAL
  Filled 2024-06-21: qty 1

## 2024-06-21 MED ORDER — PNEUMOCOCCAL 20-VAL CONJ VACC 0.5 ML IM SUSY
0.5000 mL | PREFILLED_SYRINGE | INTRAMUSCULAR | Status: AC | PRN
  Administered 2024-06-27: 0.5 mL via INTRAMUSCULAR
  Filled 2024-06-21: qty 0.5

## 2024-06-21 MED ORDER — FUROSEMIDE 10 MG/ML IJ SOLN
40.0000 mg | Freq: Once | INTRAMUSCULAR | Status: AC
  Administered 2024-06-21: 40 mg via INTRAVENOUS
  Filled 2024-06-21: qty 4

## 2024-06-21 NOTE — Plan of Care (Signed)
  Problem: Activity: Goal: Ability to tolerate increased activity will improve Outcome: Progressing   Problem: Respiratory: Goal: Ability to maintain a clear airway and adequate ventilation will improve Outcome: Progressing   Problem: Role Relationship: Goal: Method of communication will improve Outcome: Progressing   Problem: Clinical Measurements: Goal: Ability to maintain clinical measurements within normal limits will improve Outcome: Progressing Goal: Respiratory complications will improve Outcome: Progressing Goal: Cardiovascular complication will be avoided Outcome: Progressing   Problem: Elimination: Goal: Will not experience complications related to bowel motility Outcome: Progressing Goal: Will not experience complications related to urinary retention Outcome: Progressing   Problem: Health Behavior/Discharge Planning: Goal: Ability to manage health-related needs will improve Outcome: Not Progressing   Problem: Nutrition: Goal: Adequate nutrition will be maintained Outcome: Not Progressing

## 2024-06-21 NOTE — Plan of Care (Signed)
  Problem: Respiratory: Goal: Ability to maintain a clear airway and adequate ventilation will improve Outcome: Progressing   Problem: Education: Goal: Knowledge of General Education information will improve Description: Including pain rating scale, medication(s)/side effects and non-pharmacologic comfort measures Outcome: Progressing   Problem: Clinical Measurements: Goal: Ability to maintain clinical measurements within normal limits will improve Outcome: Progressing   Problem: Coping: Goal: Level of anxiety will decrease Outcome: Progressing   Problem: Elimination: Goal: Will not experience complications related to bowel motility Outcome: Progressing   Problem: Fluid Volume: Goal: Will show no signs and symptoms of excessive bleeding Outcome: Progressing

## 2024-06-21 NOTE — Consult Note (Signed)
 PHARMACY CONSULT NOTE - ELECTROLYTES  Pharmacy Consult for Electrolyte Monitoring and Replacement   Recent Labs: Height: 5' 3 (160 cm) Weight: (!) 137.9 kg (304 lb 0.2 oz) IBW/kg (Calculated) : 52.4 Estimated Creatinine Clearance: 64.4 mL/min (A) (by C-G formula based on SCr of 1.08 mg/dL (H)). Potassium (mmol/L)  Date Value  06/21/2024 4.0   Magnesium  (mg/dL)  Date Value  88/69/7974 3.0 (H)   Calcium (mg/dL)  Date Value  88/69/7974 9.1   Albumin (g/dL)  Date Value  88/69/7974 4.1  10/25/2021 4.2   Phosphorus (mg/dL)  Date Value  88/69/7974 3.4   Sodium (mmol/L)  Date Value  06/21/2024 146 (H)  10/25/2021 144    Assessment  [name] is a 73 y.o. female presenting with hematemesis. PMH significant for pulmonary hypertension, thrombocytopenia, hypertension, CAD, polyneuropathy, atrial fibrillation, cardiomyopathy. Pharmacy has been consulted to monitor and replace electrolytes.  Goal of Therapy:  Potassium 4.0 - 5.1 mmol/L Magnesium  2.0 - 2.4 mg/dL All Other Electrolytes WNL  Plan:  No electrolyte replacement warranted for today Check BMP, Mg, Phos with AM labs  Thank you for allowing pharmacy to be a part of this patient's care.  [name], PharmD Clinical Pharmacist 06/21/2024 7:07 AM

## 2024-06-21 NOTE — Progress Notes (Signed)
 NAME:  [name], MRN:  [id number], DOB:  09-16-1950, LOS: 6 ADMISSION DATE:  06/15/2024, CONSULTATION DATE:  06/15/24 REFERRING MD:  Dr. Dorothyann,   CHIEF COMPLAINT:  Hematemesis, Multifocal pneumonia   History of Present Illness:  [name] is a 73 year old who presented to [facility] ED from home for evaluation of hematemesis.   History obtained per chart review and family bedside report. Patient was in her normal state of health until 11/21. Family reported she started feeling poorly with a cough, they denied congestion, complaints of dyspnea or chest pain. Then starting on 11/24 the patient began feeling nauseous and having hematemesis. Family denied any fever/ chills or any other complaints. And no previous episodes of hematemesis or GI bleeding that family is aware of. Patient is documented as complaining of abdominal pain, but family denies this. Family reports patient was on Eliquis  due to Chronic A-fib but stopped a year ago due to financial constraints. She had a recent PCP appointment and was expecting to hear from someone to assist her in obtaining medication, but hadn't heard anything yet. Family also reports some non-compliance with medication, that she tries not to take medicine if she doesn't need it. They deny any tobacco use/ ETOH use or recreational drug use history.    CXR 06/15/24: Interval placement of an endotracheal tube with the tip measuring 3.1 cm above the carina and an enteric tube with the tip off the field of view but below the left hemidiaphragm.2. Airspace infiltrates in the right perihilar region and throughout the left lung, similar to the prior study, which may represent pneumonia, edema, or aspiration. 3. Cardiac enlargement.   CT chest wo contrast 06/15/24: . Extensive bilateral airspace disease, most pronounced throughout the left lung and in the right lower lobe, concerning for pneumonia. 2. Trace bilateral pleural effusions. 3. Cardiomegaly,  coronary artery disease. 4. Aortic atherosclerosis.   CT angio GIB protocol 06/15/24:  No active GI bleeding. 2. No occlusion or hemodynamically significant stenosis of the abdominal or pelvic arterial system. No abdominal aortic aneurysm or dissection. 3. Presacral perirectal 6.5 x 6.2 cm fluid collection, minimally changed since 2012 and likely benign. 4. Moderate-sized umbilical hernia containing fat and a small amount of fluid, increased in size since 2012   PCCM consulted for admission due to acute hypoxic respiratory failure s/t suspected aspiration pneumonia in the setting of suspected acute upper GIB on mechanical ventilatory support.  Pertinent  Medical History  Pulmonary Hypertension Thrombocytopenia Hypertension CAD Polyneuropathy PAF - Previously on Eliquis  but stopped last year d/t financial constraints Cardiomyopathy (ECHO 07/2020 LVEF 60-65%; severe LA dilatation, mild RA dilatation, borderline pulmonary hypertension w/ PASP )  Significant Hospital Events: Including procedures, antibiotic start and stop dates in addition to other pertinent events   06/15/24: Admit to ICU due to acute hypoxic respiratory failure s/t suspected aspiration pneumonia in the setting of suspected acute upper GIB on mechanical ventilatory support. 06/16/24: Remains critically ill requiring mechanical ventilation with increasing pressor requirements. Central line and arterial line placed. GI following; plan for EGD today. 06/17/24: EGD found evidence of Mallory Weiss tear without active bleeding. Remains critically ill requiring mechanical ventilation. Vent requirements decreasing. Pressor requirements coming down. Cardiology following. 06/18/24: plan for wake-up assessment today 06/19/24: Extubated yesterday to HFNC. No overnight events. Remains on amiodarone . No longer requiring pressors or sedation. Intermittent confusion. Satting well on room air.  06/20/24: Off Levophed , remains on  Amiodarone  gtt  11/30 weaning off pressors, high risk  for intubation, CT chead no acute CVA  Interim History / Subjective:  Ill appearing Morbidly obese Moaning and groaning No oxygen Lasix  as tolerated    Objective    Blood pressure (!) 175/87, pulse (!) 52, temperature 98 F (36.7 C), temperature source Axillary, resp. rate 20, height 5' 3 (1.6 m), weight (!) 137.9 kg, SpO2 100%. CVP:  [16 mmHg-26 mmHg] 17 mmHg      Intake/Output Summary (Last 24 hours) at 06/21/2024 0734 Last data filed at 06/21/2024 0401 Gross per 24 hour  Intake 2303.83 ml  Output 1075 ml  Net 1228.83 ml   Filed Weights   06/19/24 0458 06/20/24 0500 06/21/24 0414  Weight: (!) 138.3 kg 134.8 kg (!) 137.9 kg   Examination: Morbidly obese Ill appearing +rhonchi No murmurs Soft NT ND abdomen No focal deficits +edema    Assessment and Plan   73 yo female admitted for acute severe multifocal pneumonia leading to resp failure and intubation, extubated now but high risk for aspiration, complicated by afib with RR on AMIO, +demand ischemia with metabolic encephalopathy   Resp failure/Pneumonia Oxygen as needed Aspiration precations CXR and ABG as needed -Supplemental oxygen to maintain O2 sats >88% -Duonebs prn and Solumedrol 20mg  Q12h -Intermittent CXR and ABG  Cardiac failure due to Paroxysmal Atrial Fibrillation with RVR Acute Decompensated Diastolic Heart Failure  Mildly Elevated Troponin likely Demand Ischemia  Hx: Hypertension, PAF, Pulmonary Hypertension, Cardiomyopathy 06/16/24 ECHO: -Euvolemic on exam.  -BNP (): 5,289 -Chest XR (): No acute chest findings  -ECHO (06/16/24): LVEF 60-65% with grade II diastolic dysfunction; moderately elevated pulmonary artery systolic pressure; tricuspid regurgitation with right atrial pressure of . Lasix  as tolerated -Continue amiodarone  gtt -Holding anticoagulation iso GI Bleed -Cardiology following; appreciate input   Acute Upper GI  Bleed s/t Mallory Weiss Tear ~ Resolved Acute Blood Loss Anemia s/t Mallory Weiss Tear ~ Resolved Thrombocytopenia 06/15/24 CTA GI Bleed: no active GI bleed; no occlusion or HD significant stenosis of abdominal or pelvic arterial system. No AAA or dissection.  -Transfuse if Hgb < 7; Hgb trending down at 9.9 -Monitor for s/sx of bleeding -DVT prophylaxis: SCDs; will need AC at some point   ACUTE KIDNEY INJURY/Renal Failure -continue Foley Catheter-assess need -Avoid nephrotoxic agents -Follow urine output, BMP -Ensure adequate renal perfusion, optimize oxygenation -Renal dose medications   Intake/Output Summary (Last 24 hours) at 06/21/2024 0742 Last data filed at 06/21/2024 0401 Gross per 24 hour  Intake 2303.83 ml  Output 1075 ml  Net 1228.83 ml    Acute Metabolic Encephalopathy s/t Septic Shock d/t Multifocal Pneumonia  06/15/24 CT Head wo contrast: no acute intracranial abnormality 11/29 CT head   no acute intracranial abnormality - No longer requiring sedation - Encourage family support at bedside - Maintain appropriate sleep wake cycles   Labs   CBC: Recent Labs  Lab 06/16/24 0944 06/17/24 0330 06/18/24 0335 06/19/24 0250 06/20/24 0515 06/21/24 0352  WBC 22.2* 24.9* 14.6* 8.6 9.0 9.2  NEUTROABS 16.5*  --   --   --   --   --   HGB 11.2* 11.0* 10.1* 9.9* 10.5* 10.8*  HCT 34.3* 33.2* 30.0* 30.5* 32.6* 34.6*  MCV 95.0 94.3 92.9 93.6 96.7 99.1  PLT 119* 108* 104* 107* 106* 105*    Basic Metabolic Panel: Recent Labs  Lab 06/17/24 0330 06/18/24 0335 06/18/24 0355 06/19/24 0250 06/19/24 1407 06/19/24 2000 06/20/24 0515 06/20/24 2205 06/21/24 0352  NA 134* 136  --  140  --   --  146* 145 146*  K 3.9 3.3*  --  3.1* 3.2* 3.6 3.4* 3.9 4.0  CL 101 99  --  99  --   --  106 104 103  CO2 20* 23  --  27  --   --  29 29 30   GLUCOSE 132* 130*  --  123*  --   --  128* 122* 130*  BUN 26* 32*  --  34*  --   --  35* 41* 42*  CREATININE 1.22* 1.12*  --  1.16*  --   --   1.04* 1.04* 1.08*  CALCIUM 7.7* 8.0*  --  8.4*  --   --  8.5* 9.0 9.1  MG 1.6*  --  2.1  --  2.5*  --  2.7*  --  3.0*  PHOS 4.5 3.3  --  2.9  --   --  2.4*  --  3.4   GFR: Estimated Creatinine Clearance: 64.4 mL/min (A) (by C-G formula based on SCr of 1.08 mg/dL (H)). Recent Labs  Lab 06/15/24 2203 06/16/24 0440 06/16/24 0944 06/17/24 0330 06/18/24 0335 06/19/24 0250 06/20/24 0515 06/20/24 2205 06/21/24 0352  PROCALCITON  --  18.20  --  24.10  --   --   --   --   --   WBC  --   --    < > 24.9* 14.6* 8.6 9.0  --  9.2  LATICACIDVEN 1.5  --   --  1.9  --   --   --  1.2  --    < > = values in this interval not displayed.    Liver Function Tests: Recent Labs  Lab 06/15/24 2005 06/17/24 0330 06/18/24 0335 06/19/24 0250 06/20/24 0515 06/21/24 0352  AST 23  --   --   --   --   --   ALT 6  --   --   --   --   --   ALKPHOS 76  --   --   --   --   --   BILITOT 1.4*  --   --   --   --   --   PROT 7.1  --   --   --   --   --   ALBUMIN 4.4 3.4* 3.4* 3.7 3.8 4.1   Recent Labs  Lab 06/15/24 2005  LIPASE 18   No results for input(s): AMMONIA in the last 168 hours.  ABG    Component Value Date/Time   PHART 7.47 (H) 06/18/2024 1237   PCO2ART 39 06/18/2024 1237   PO2ART 83 06/18/2024 1237   HCO3 28.4 (H) 06/18/2024 1237   ACIDBASEDEF 1.1 06/16/2024 0501   O2SAT 99.1 06/18/2024 1237     Coagulation Profile: Recent Labs  Lab 06/16/24 0443  INR 1.2    Cardiac Enzymes: No results for input(s): CKTOTAL, CKMB, CKMBINDEX, TROPONINI in the last 168 hours.  HbA1C: No results found for: HGBA1C  CBG: Recent Labs  Lab 06/19/24 2159 06/20/24 0734 06/20/24 1154 06/20/24 1615 06/20/24 2108  GLUCAP 124* 121* 132* 125* 124*    Review of Systems:   Denies chest pain, shortness of breath, headache, vision changes or abdominal pain.  Past Medical History:  She,  has a past medical history of History of shingles, Knee pain, and Migraines.   Surgical History:    Past Surgical History:  Procedure Laterality Date   COLONOSCOPY WITH PROPOFOL  N/A 02/19/2020   Procedure: COLONOSCOPY WITH PROPOFOL ;  Surgeon: Tahiliani, Varnita  B, MD;  Location: [facility] ENDOSCOPY;  Service: Endoscopy;  Laterality: N/A;   ESOPHAGOGASTRODUODENOSCOPY N/A 06/16/2024   Procedure: EGD (ESOPHAGOGASTRODUODENOSCOPY);  Surgeon: Jinny Carmine, MD;  Location: [facility] ENDOSCOPY;  Service: Endoscopy;  Laterality: N/A;   REPLACEMENT TOTAL KNEE     left knee   TUBAL LIGATION       Social History:   reports that she has never smoked. She has never used smokeless tobacco. She reports that she does not drink alcohol and does not use drugs.   Family History:  Her family history includes Cancer in her brother and father; Heart disease in her mother; Hypertension in her mother.   Allergies No Known Allergies   Home Medications  Prior to Admission medications   Medication Sig Start Date End Date Taking? Authorizing Provider  Acetaminophen  (TYLENOL  8 HOUR PO) Take by mouth as needed.   Yes [provider]  metoprolol  succinate (TOPROL -XL) 100 MG 24 hr tablet Take 1 tablet (100 mg total) by mouth in the morning and at bedtime. Take with or immediately following a meal. 08/25/21 06/18/24 Yes Melvin Pao, NP  ondansetron  (ZOFRAN ) 4 MG tablet Take 1 tablet (4 mg total) by mouth every 8 (eight) hours as needed for nausea or vomiting. 08/10/20  Yes Rumball, Alison M, DO  pregabalin (LYRICA) 50 MG capsule Take 1 capsule by mouth 2 (two) times daily. 04/24/24  Yes [provider]  albuterol  (VENTOLIN  HFA) 108 (90 Base) MCG/ACT inhaler Inhale 1-2 puffs into the lungs every 6 (six) hours as needed for wheezing or shortness of breath. 01/25/21 01/25/22  Melvin Pao, NP  apixaban  (ELIQUIS ) 5 MG TABS tablet Take 1 tablet (5 mg total) by mouth 2 (two) times daily. Patient not taking: Reported on 06/18/2024 12/22/21   Melvin Pao, NP  gabapentin  (NEURONTIN ) 600 MG tablet Take 1 tablet  (600 mg total) by mouth 3 (three) times daily as needed. Patient not taking: Reported on 06/16/2024 12/22/21   Melvin Pao, NP  Spacer/Aero-Holding [facility] DIAMOND) MISC by Does not apply route. 08/17/20   [provider]  torsemide  (DEMADEX ) 20 MG tablet TAKE 1 TABLET BY MOUTH EVERY DAY Patient not taking: Reported on 04/12/2022 12/08/21   Cindie Ole DASEN, MD  Scheduled Meds:  amiodarone   150 mg Intravenous Once   budesonide  (PULMICORT ) nebulizer solution  0.5 mg Nebulization BID   Chlorhexidine  Gluconate Cloth  6 each Topical Daily   insulin  aspart  0-5 Units Subcutaneous QHS   insulin  aspart  0-9 Units Subcutaneous TID WC   ipratropium-albuterol   3 mL Nebulization BID   methylPREDNISolone  (SOLU-MEDROL ) injection  20 mg Intravenous Q12H   pantoprazole  (PROTONIX ) IV  40 mg Intravenous Q12H   sodium chloride  flush  10-40 mL Intracatheter Q12H   Continuous Infusions:  amiodarone  30 mg/hr (06/20/24 2120)   ampicillin -sulbactam (UNASYN ) IV 3 g (06/21/24 0401)   PRN Meds:.acetaminophen  **OR** acetaminophen , fentaNYL  (SUBLIMAZE ) injection, HYDROmorphone (DILAUDID) injection, mouth rinse, pneumococcal 20-valent conjugate vaccine, sodium chloride  flush     DVT/GI PRX  assessed I Assessed the need for Labs I Assessed the need for Foley I Assessed the need for Central Venous Line Family Discussion when available I Assessed the need for Mobilization I made an Assessment of medications to be adjusted accordingly Safety Risk assessment completed  CASE DISCUSSED IN MULTIDISCIPLINARY ROUNDS WITH ICU TEAM  Critical Care Time devoted to patient care services described in this note is 45 minutes.   Nickolas Alm Cellar, M.D.  [facility]  Medical Director [facility] [place]

## 2024-06-22 DIAGNOSIS — R6521 Severe sepsis with septic shock: Secondary | ICD-10-CM

## 2024-06-22 DIAGNOSIS — K226 Gastro-esophageal laceration-hemorrhage syndrome: Secondary | ICD-10-CM | POA: Diagnosis not present

## 2024-06-22 DIAGNOSIS — A419 Sepsis, unspecified organism: Secondary | ICD-10-CM | POA: Diagnosis not present

## 2024-06-22 LAB — BASIC METABOLIC PANEL WITH GFR
Anion gap: 12 (ref 5–15)
BUN: 50 mg/dL — ABNORMAL HIGH (ref 8–23)
CO2: 33 mmol/L — ABNORMAL HIGH (ref 22–32)
Calcium: 9.5 mg/dL (ref 8.9–10.3)
Chloride: 105 mmol/L (ref 98–111)
Creatinine, Ser: 1.12 mg/dL — ABNORMAL HIGH (ref 0.44–1.00)
GFR, Estimated: 52 mL/min — ABNORMAL LOW (ref >60)
Glucose, Bld: 113 mg/dL — ABNORMAL HIGH (ref 70–99)
Potassium: 3.6 mmol/L (ref 3.5–5.1)
Sodium: 150 mmol/L — ABNORMAL HIGH (ref 135–145)

## 2024-06-22 LAB — CBC
HCT: 34.5 % — ABNORMAL LOW (ref 36.0–46.0)
Hemoglobin: 10.7 g/dL — ABNORMAL LOW (ref 12.0–15.0)
MCH: 30.3 pg (ref 26.0–34.0)
MCHC: 31 g/dL (ref 30.0–36.0)
MCV: 97.7 fL (ref 80.0–100.0)
Platelets: 110 K/uL — ABNORMAL LOW (ref 150–400)
RBC: 3.53 MIL/uL — ABNORMAL LOW (ref 3.87–5.11)
RDW: 13.9 % (ref 11.5–15.5)
WBC: 9.2 K/uL (ref 4.0–10.5)
nRBC: 0.4 % — ABNORMAL HIGH (ref 0.0–0.2)

## 2024-06-22 LAB — MAGNESIUM: Magnesium: 3.2 mg/dL — ABNORMAL HIGH (ref 1.7–2.4)

## 2024-06-22 LAB — GLUCOSE, CAPILLARY
Glucose-Capillary: 105 mg/dL — ABNORMAL HIGH (ref 70–99)
Glucose-Capillary: 112 mg/dL — ABNORMAL HIGH (ref 70–99)
Glucose-Capillary: 102 mg/dL — ABNORMAL HIGH (ref 70–99)
Glucose-Capillary: 116 mg/dL — ABNORMAL HIGH (ref 70–99)

## 2024-06-22 MED ORDER — POTASSIUM CHLORIDE 10 MEQ/100ML IV SOLN
10.0000 meq | INTRAVENOUS | Status: AC
  Administered 2024-06-22 (×4): 10 meq via INTRAVENOUS
  Filled 2024-06-22 (×4): qty 100

## 2024-06-22 MED ORDER — ENSURE PLUS HIGH PROTEIN PO LIQD
237.0000 mL | Freq: Two times a day (BID) | ORAL | Status: DC
  Administered 2024-06-22 – 2024-06-28 (×10): 237 mL via ORAL

## 2024-06-22 MED ORDER — ADULT MULTIVITAMIN W/MINERALS CH
1.0000 | ORAL_TABLET | Freq: Every day | ORAL | Status: DC
  Administered 2024-06-22 – 2024-06-28 (×6): 1 via ORAL
  Filled 2024-06-22 (×8): qty 1

## 2024-06-22 MED ORDER — POTASSIUM CHLORIDE CRYS ER 20 MEQ PO TBCR: 40.0000 meq | EXTENDED_RELEASE_TABLET | Freq: Once | ORAL | Status: DC

## 2024-06-22 NOTE — Plan of Care (Signed)
 Problem: Activity: Goal: Ability to tolerate increased activity will improve Outcome: Progressing   Problem: Respiratory: Goal: Ability to maintain a clear airway and adequate ventilation will improve Outcome: Progressing   Problem: Role Relationship: Goal: Method of communication will improve Outcome: Progressing   Problem: Education: Goal: Knowledge of General Education information will improve Description: Including pain rating scale, medication(s)/side effects and non-pharmacologic comfort measures Outcome: Progressing   Problem: Health Behavior/Discharge Planning: Goal: Ability to manage health-related needs will improve Outcome: Progressing   Problem: Clinical Measurements: Goal: Ability to maintain clinical measurements within normal limits will improve Outcome: Progressing Goal: Will remain free from infection Outcome: Progressing Goal: Diagnostic test results will improve Outcome: Progressing Goal: Respiratory complications will improve Outcome: Progressing Goal: Cardiovascular complication will be avoided Outcome: Progressing   Problem: Activity: Goal: Risk for activity intolerance will decrease Outcome: Progressing   Problem: Nutrition: Goal: Adequate nutrition will be maintained Outcome: Progressing   Problem: Coping: Goal: Level of anxiety will decrease Outcome: Progressing   Problem: Elimination: Goal: Will not experience complications related to bowel motility Outcome: Progressing Goal: Will not experience complications related to urinary retention Outcome: Progressing   Problem: Pain Managment: Goal: General experience of comfort will improve and/or be controlled Outcome: Progressing   Problem: Safety: Goal: Ability to remain free from injury will improve Outcome: Progressing   Problem: Skin Integrity: Goal: Risk for impaired skin integrity will decrease Outcome: Progressing   Problem: Education: Goal: Ability to identify signs and  symptoms of gastrointestinal bleeding will improve Outcome: Progressing   Problem: Bowel/Gastric: Goal: Will show no signs and symptoms of gastrointestinal bleeding Outcome: Progressing   Problem: Fluid Volume: Goal: Will show no signs and symptoms of excessive bleeding Outcome: Progressing   Problem: Clinical Measurements: Goal: Complications related to the disease process, condition or treatment will be avoided or minimized Outcome: Progressing   Problem: Education: Goal: Ability to demonstrate management of disease process will improve Outcome: Progressing Goal: Ability to verbalize understanding of medication therapies will improve Outcome: Progressing Goal: Individualized Educational Video(s) Outcome: Progressing   Problem: Activity: Goal: Capacity to carry out activities will improve Outcome: Progressing   Problem: Cardiac: Goal: Ability to achieve and maintain adequate cardiopulmonary perfusion will improve Outcome: Progressing   Problem: Education: Goal: Knowledge of disease or condition will improve Outcome: Progressing Goal: Understanding of medication regimen will improve Outcome: Progressing Goal: Individualized Educational Video(s) Outcome: Progressing   Problem: Activity: Goal: Ability to tolerate increased activity will improve Outcome: Progressing   Problem: Cardiac: Goal: Ability to achieve and maintain adequate cardiopulmonary perfusion will improve Outcome: Progressing   Problem: Health Behavior/Discharge Planning: Goal: Ability to safely manage health-related needs after discharge will improve Outcome: Progressing   Problem: Education: Goal: Ability to describe self-care measures that may prevent or decrease complications (Diabetes Survival Skills Education) will improve Outcome: Progressing Goal: Individualized Educational Video(s) Outcome: Progressing   Problem: Coping: Goal: Ability to adjust to condition or change in health will  improve Outcome: Progressing   Problem: Fluid Volume: Goal: Ability to maintain a balanced intake and output will improve Outcome: Progressing   Problem: Health Behavior/Discharge Planning: Goal: Ability to identify and utilize available resources and services will improve Outcome: Progressing Goal: Ability to manage health-related needs will improve Outcome: Progressing   Problem: Metabolic: Goal: Ability to maintain appropriate glucose levels will improve Outcome: Progressing   Problem: Nutritional: Goal: Maintenance of adequate nutrition will improve Outcome: Progressing Goal: Progress toward achieving an optimal weight will improve Outcome: Progressing   Problem:  Skin Integrity: Goal: Risk for impaired skin integrity will decrease Outcome: Progressing

## 2024-06-22 NOTE — Progress Notes (Signed)
 Occupational Therapy Treatment Patient Details Name: [name] MRN: [id number] DOB: 17-Nov-1950 Today's Date: 06/22/2024   History of present illness Patient is a 73 year old female with acute hypoxic respiratory failure s/t suspected aspiration pneumonia in the setting of suspected acute upper GIB requiring vent support. Extubated 11/27. PMH: migraines, atrial fibrillation.   OT comments  Pt is supine in bed on arrival. Pleasant and agreeable to OT/PT co-treatment session. She denies pain initially, but reports R hand pain at end of session. Pleasantly confused, follows one step directions with increased time although inconsistently. Mod A X2 for bed mobility tasks and Min to Mod A x2 for standing, transfers and mobility this date. Min to SBA for seated self feeding tasks. Max A for UB/LB dressing to donn gown and socks. She was left in recliner with family member present to ensure self feeding and safety in recliner. All needs in place and will cont to require skilled acute OT services to maximize her safety and IND to return to PLOF.       If plan is discharge home, recommend the following:  Two people to help with walking and/or transfers;Two people to help with bathing/dressing/bathroom   Equipment Recommendations  Hospital bed;Other (comment) (defer to next venue)    Recommendations for Other Services      Precautions / Restrictions Precautions Precautions: Fall Restrictions Weight Bearing Restrictions Per Provider Order: No       Mobility Bed Mobility Overal bed mobility: Needs Assistance Bed Mobility: Supine to Sit     Supine to sit: Mod assist, Used rails, HOB elevated, +2 for safety/equipment     General bed mobility comments: initiation of BLE movement, ultimately required +2 Mod A for cues for technique    Transfers Overall transfer level: Needs assistance Equipment used: Rolling walker (2 wheels) Transfers: Sit to/from Stand, Bed to  chair/wheelchair/BSC Sit to Stand: Mod assist, Min assist, +2 physical assistance, +2 safety/equipment, From elevated surface     Step pivot transfers: Min assist, +2 physical assistance, +2 safety/equipment, From elevated surface     General transfer comment: stood from elevated bed height with Min A X2 as well as stand step pivot to recliner, Mod A X2 to stand from recliner and able to ambulate a few steps forward this date; increased time for lift off with cues for hand placement and assist for RW management     Balance Overall balance assessment: Needs assistance Sitting-balance support: Feet supported Sitting balance-Leahy Scale: Fair Sitting balance - Comments: noted to lean in all directions, restless and confused   Standing balance support: Bilateral upper extremity supported, Reliant on assistive device for balance, During functional activity Standing balance-Leahy Scale: Poor Standing balance comment: +2 person assistance required                           ADL either performed or assessed with clinical judgement   ADL Overall ADL's : Needs assistance/impaired Eating/Feeding: Supervision/ safety;Sitting Eating/Feeding Details (indicate cue type and reason): to hold cup and drink from it, as well as to feed self with spoon             Upper Body Dressing : Maximal assistance;Sitting Upper Body Dressing Details (indicate cue type and reason): change out gowns Lower Body Dressing: Maximal assistance Lower Body Dressing Details (indicate cue type and reason): donn bil socks Toilet Transfer: Cueing for safety;Cueing for sequencing;Stand-pivot;Moderate assistance;+2 for physical assistance Toilet Transfer Details (indicate cue type  and reason): simulated bed to recliner using RW with +2 assist for safety and sequencing and RW managemnet                Extremity/Trunk Assessment              Vision       Perception     Praxis     Communication  Communication Communication: Impaired Factors Affecting Communication: Reduced clarity of speech   Cognition Arousal: Alert Behavior During Therapy: Restless                                 Following commands: Impaired Following commands impaired: Follows one step commands with increased time      Cueing   Cueing Techniques: Verbal cues, Gestural cues, Tactile cues, Visual cues  Exercises      Shoulder Instructions       General Comments VSS, granddaughter arriving at end of session    Pertinent Vitals/ Pain       Pain Assessment Pain Assessment: Faces Faces Pain Scale: Hurts a little bit Pain Location: R hand Pain Descriptors / Indicators: Sore Pain Intervention(s): Monitored during session, Repositioned  Home Living                                          Prior Functioning/Environment              Frequency  Min 2X/week        Progress Toward Goals  OT Goals(current goals can now be found in the care plan section)  Progress towards OT goals: Progressing toward goals  Acute Rehab OT Goals Patient Stated Goal: get better OT Goal Formulation: With patient/family Time For Goal Achievement: 07/03/24 Potential to Achieve Goals: Fair  Plan      Co-evaluation    PT/OT/SLP Co-Evaluation/Treatment: Yes Reason for Co-Treatment: For patient/therapist safety;Complexity of the patient's impairments (multi-system involvement);To address functional/ADL transfers PT goals addressed during session: Mobility/safety with mobility;Balance OT goals addressed during session: ADL's and self-care      AM-PAC OT 6 Clicks Daily Activity     Outcome Measure   Help from another person eating meals?: A Little Help from another person taking care of personal grooming?: A Lot Help from another person toileting, which includes using toliet, bedpan, or urinal?: A Lot Help from another person bathing (including washing, rinsing, drying)?: A  Lot Help from another person to put on and taking off regular upper body clothing?: A Lot Help from another person to put on and taking off regular lower body clothing?: A Lot 6 Click Score: 13    End of Session Equipment Utilized During Treatment: Gait belt;Rolling walker (2 wheels)  OT Visit Diagnosis: Other abnormalities of gait and mobility (R26.89);Muscle weakness (generalized) (M62.81)   Activity Tolerance Patient tolerated treatment well   Patient Left with call bell/phone within reach;in chair;with chair alarm set;with family/visitor present   Nurse Communication Mobility status        Time: 1410-1442 OT Time Calculation (min): 32 min  Charges: OT General Charges $OT Visit: 1 Visit OT Treatments $Therapeutic Activity: 8-22 mins  Darcelle Herrada Chrismon, OTR/L  06/22/24, 4:16 PM   Lyndel Sarate E Chrismon 06/22/2024, 4:14 PM

## 2024-06-22 NOTE — Progress Notes (Addendum)
 PROGRESS NOTE    [name]  FMW:[id number] DOB: 05-01-1951 DOA: 06/15/2024 PCP: Odell Tor Edra [name], MD  Chief Complaint  Patient presents with   Hematemesis    Hospital Course:  [name] 73 year old female who presented with cough, and hematemesis.  She was in acute hypoxic respiratory failure secondary to suspected aspiration pneumonia in the setting of upper GI bleed.  She required intubation and was admitted to the ICU.  On 11/25 she underwent EGD which revealed Mallory-Weiss tear without active bleeding.  She was successfully extubated on 11/27.  She remained on pressors until 11/29.  ICU stay further complicated by A-fib with RVR and decompensated heart failure.  She was started on amiodarone  drip.  Cardiology was consulted.  On 12/1 she was transferred to the TR[facility] service.  Subjective: This morning patient is complaining of thirst.  She is alert and oriented x 4.  She denies any pain at this time.   Objective: Vitals:   06/21/24 2200 06/21/24 2239 06/22/24 0414 06/22/24 0500  BP: (!) 142/66 (!) 152/63 (!) 141/42   Pulse: 68 67 63   Resp: (!) 9 16    Temp:  98.2 F (36.8 C) 97.6 F (36.4 C)   TempSrc:  Oral Axillary   SpO2: 99% 96% 98%   Weight:  135.5 kg  134.8 kg  Height:  5' 3 (1.6 m)      Intake/Output Summary (Last 24 hours) at 06/22/2024 0745 Last data filed at 06/22/2024 0542 Gross per 24 hour  Intake 402.58 ml  Output 850 ml  Net -447.42 ml   Filed Weights   06/21/24 0414 06/21/24 2239 06/22/24 0500  Weight: (!) 137.9 kg 135.5 kg 134.8 kg    Examination: General exam: Appears calm and comfortable, NAD, appears older than stated age Respiratory system: No work of breathing, rhonchorous upper airway sounds, cough Cardiovascular system: S1 & S2 heard, RRR.  Gastrointestinal system: Abdomen is nondistended, soft and nontender.  Neuro: Alert and oriented. No focal neurological deficits. Extremities: Symmetric, expected ROM Skin: No rashes,  lesions Psychiatry: Demonstrates appropriate judgement and insight. Mood & affect appropriate for situation.   Assessment & Plan:  Principal Problem:   GIB (gastrointestinal bleeding) Active Problems:   Septic shock (HCC)   Hematemesis   Mallory-Weiss tear    Acute hypoxic respiratory failure - Secondary to aspiration pneumonia and upper GI bleed - Extubated 11/26 - Continue supplemental oxygen, wean as able - Continue DuoNebs and Solu-Medrol   Septic shock, resolved - Required pressors while in the ICU.  Blood pressure stable now  Aspiration pneumonia - Continue Unasyn  - Aspiration precautions - No further leukocytosis - Monitor fever curve and WBC.  Initial procalcitonin 18.2  Paroxysmal A-fib with RVR - Initiated on amiodarone  drip in ICU, appears she is not on anything for maintenance now.  Currently rate controlled - History of A-fib, not on Eliquis  at home due to expense - Eliquis  not resumed during this admission due to active GI bleed  Acute on chronic diastolic heart failure Pulmonary hypertension - proBNP 6725 - Echocardiogram 06/16/2024: LVEF 60 to 65%, grade 2 diastolic dysfunction, moderately elevated pulmonary arterial systolic pressure, tricuspid regurg with right atrial pressure of 15 mmHg - Will need close outpatient follow-up with cardiology  Acute upper GI bleed Acute blood loss anemia Mallory-Weiss tear - Mallory-Weiss tear seen on EGD 11/25. - Initial CTA GI bleed negative - Continue to monitor hemoglobin, transfuse if less than 7  AKI - Baseline creatinine 0.9, peak  at 1.22 this admission - Improving now  Acute metabolic encephalopathy, resolved - Secondary to septic shock and prolonged ICU stay - Head CT 11/24 without acute abnormality - Repeat head CT 11/29 no acute intracranial abnormality - Patient is alert and oriented x 4 today.  Anticipate she would do much better with speech therapy eval he can likely start diet.  Have reached back  out to SLP for reevaluation - Frequent reorientation, Delirium precautions  Hypernatremia, suspected hypovolemia - Sodium 150 this morning - BUN rising, anticipate patient is getting dry.  Will discontinue Lasix   Body mass index is 52.64 kg/m. Obesity Class III - Outpatient follow up for lifestyle modification and risk factor management  Abnormal TSH - TSH 6.380, T4 and T3 WNL - Can follow-up outpatient with PCP to repeat these labs when she is not acutely ill.  DVT prophylaxis: SCDs   Code Status: Full Code Disposition: Inpatient pending improvement.  Will likely need SNF at DC.  PT/OT eval's  *Was able to update daughter, [name], on the phone.  Consultants:    Procedures:    Antimicrobials:  Anti-infectives (From admission, onward)    Start     Dose/Rate Route Frequency Ordered Stop   06/16/24 1215  doxycycline  (VIBRAMYCIN ) 100 mg in sodium chloride  0.9 % 250 mL IVPB        100 mg 125 mL/hr over 120 Minutes Intravenous 2 times daily 06/16/24 1120 06/20/24 2359   06/16/24 1100  azithromycin  (ZITHROMAX ) 500 mg in sodium chloride  0.9 % 250 mL IVPB  Status:  Discontinued        500 mg 250 mL/hr over 60 Minutes Intravenous Every 24 hours 06/16/24 1008 06/16/24 1120   06/16/24 0800  Ampicillin -Sulbactam (UNASYN ) 3 g in sodium chloride  0.9 % 100 mL IVPB        3 g 200 mL/hr over 30 Minutes Intravenous Every 6 hours 06/16/24 0056 06/26/24 0259   06/16/24 0130  vancomycin  (VANCOREADY) IVPB 2000 mg/400 mL        2,000 mg 200 mL/hr over 120 Minutes Intravenous  Once 06/16/24 0032 06/16/24 0335   06/15/24 2200  erythromycin  250 mg in sodium chloride  0.9 % 100 mL IVPB        250 mg 100 mL/hr over 60 Minutes Intravenous Once 06/15/24 2050 06/16/24 0132   06/15/24 2115  ceFEPIme  (MAXIPIME ) 2 g in sodium chloride  0.9 % 100 mL IVPB        2 g 200 mL/hr over 30 Minutes Intravenous  Once 06/15/24 2103 06/16/24 0131   06/15/24 2115  metroNIDAZOLE  (FLAGYL ) IVPB 500 mg        500  mg 100 mL/hr over 60 Minutes Intravenous  Once 06/15/24 2103 06/16/24 0226   06/15/24 2115  vancomycin  (VANCOCIN ) IVPB 1000 mg/200 mL premix  Status:  Discontinued        1,000 mg 200 mL/hr over 60 Minutes Intravenous  Once 06/15/24 2103 06/16/24 0032       Data Reviewed: I have personally reviewed following labs and imaging studies CBC: Recent Labs  Lab 06/16/24 0944 06/17/24 0330 06/18/24 0335 06/19/24 0250 06/20/24 0515 06/21/24 0352 06/22/24 0500  WBC 22.2*   < > 14.6* 8.6 9.0 9.2 9.2  NEUTROABS 16.5*  --   --   --   --   --   --   HGB 11.2*   < > 10.1* 9.9* 10.5* 10.8* 10.7*  HCT 34.3*   < > 30.0* 30.5* 32.6* 34.6* 34.5*  MCV 95.0   < >  92.9 93.6 96.7 99.1 97.7  PLT 119*   < > 104* 107* 106* 105* 110*   < > = values in this interval not displayed.   Basic Metabolic Panel: Recent Labs  Lab 06/17/24 0330 06/18/24 0335 06/18/24 0355 06/19/24 0250 06/19/24 1407 06/19/24 2000 06/20/24 0515 06/20/24 2205 06/21/24 0352 06/22/24 0500  NA 134* 136  --  140  --   --  146* 145 146* 150*  K 3.9 3.3*  --  3.1* 3.2* 3.6 3.4* 3.9 4.0 3.6  CL 101 99  --  99  --   --  106 104 103 105  CO2 20* 23  --  27  --   --  29 29 30  33*  GLUCOSE 132* 130*  --  123*  --   --  128* 122* 130* 113*  BUN 26* 32*  --  34*  --   --  35* 41* 42* 50*  CREATININE 1.22* 1.12*  --  1.16*  --   --  1.04* 1.04* 1.08* 1.12*  CALCIUM 7.7* 8.0*  --  8.4*  --   --  8.5* 9.0 9.1 9.5  MG 1.6*  --  2.1  --  2.5*  --  2.7*  --  3.0* 3.2*  PHOS 4.5 3.3  --  2.9  --   --  2.4*  --  3.4  --    GFR: Estimated Creatinine Clearance: 61.2 mL/min (A) (by C-G formula based on SCr of 1.12 mg/dL (H)). Liver Function Tests: Recent Labs  Lab 06/15/24 2005 06/17/24 0330 06/18/24 0335 06/19/24 0250 06/20/24 0515 06/21/24 0352  AST 23  --   --   --   --   --   ALT 6  --   --   --   --   --   ALKPHOS 76  --   --   --   --   --   BILITOT 1.4*  --   --   --   --   --   PROT 7.1  --   --   --   --   --   ALBUMIN  4.4 3.4* 3.4* 3.7 3.8 4.1   CBG: Recent Labs  Lab 06/20/24 2108 06/21/24 0742 06/21/24 1130 06/21/24 1626 06/21/24 2146  GLUCAP 124* 112* 117* 101* 126*    Recent Results (from the past 240 hours)  Blood Culture (routine x 2)     Status: None (Preliminary result)   Collection Time: 06/15/24 10:03 PM   Specimen: BLOOD  Result Value Ref Range Status   Specimen Description   Final    BLOOD LEFT ANTECUBITAL Performed at [facility], [address] [place], [place] [address]    Special Requests   Final    BOTTLES DRAWN AEROBIC AND ANAEROBIC Blood Culture adequate volume Performed at [facility], [address], [place], [place] [address]    Culture  Setup Time   Final    GRAM POSITIVE RODS AEROBIC BOTTLE ONLY CRITICAL RESULT CALLED TO, READ BACK BY AND VERIFIED WITH:  LISA KLUTTZ AT 2339 06/18/24 JG Performed at [facility], [address], [place], [place] [address]    Culture   Final    VONNE POSITIVE RODS IDENTIFICATION TO FOLLOW Performed at [facility], [address], [place], [place] [address]    Report Status PENDING  Incomplete  Blood Culture (routine x 2)     Status: None   Collection Time: 06/15/24  10:03 PM   Specimen: BLOOD  Result Value Ref Range Status   Specimen Description BLOOD RIGHT ANTECUBITAL  Final   Special Requests   Final    BOTTLES DRAWN AEROBIC AND ANAEROBIC Blood Culture results may not be optimal due to an inadequate volume of blood received in culture bottles   Culture   Final    NO GROWTH 5 DAYS Performed at [facility], [address] [place], [place] [address]    Report Status 06/20/2024 FINAL  Final  Resp panel by RT-PCR (RSV, Flu A&B, Covid) Nasal Mucosa     Status: None   Collection Time: 06/15/24 11:45 PM   Specimen: Nasal Mucosa; Nasal Swab  Result Value Ref Range Status   SARS Coronavirus 2 by RT PCR NEGATIVE NEGATIVE Final    Comment: (NOTE) SARS-CoV-2 target nucleic acids are  NOT DETECTED.  The SARS-CoV-2 RNA is generally detectable in upper respiratory specimens during the acute phase of infection. The lowest concentration of SARS-CoV-2 viral copies this assay can detect is 138 copies/mL. A negative result does not preclude SARS-Cov-2 infection and should not be used as the sole basis for treatment or other patient management decisions. A negative result may occur with  improper specimen collection/handling, submission of specimen other than nasopharyngeal swab, presence of viral mutation(s) within the areas targeted by this assay, and inadequate number of viral copies(<138 copies/mL). A negative result must be combined with clinical observations, patient history, and epidemiological information. The expected result is Negative.  Fact Sheet for Patients:  bloggercourse.com  Fact Sheet for Healthcare Providers:  seriousbroker.it  This test is no t yet approved or cleared by the [place]  FDA and  has been authorized for detection and/or diagnosis of SARS-CoV-2 by FDA under an Emergency Use Authorization (EUA). This EUA will remain  in effect (meaning this test can be used) for the duration of the COVID-19 declaration under Section 564(b)(1) of the Act, 21 U.S.C.section 360bbb-3(b)(1), unless the authorization is terminated  or revoked sooner.       Influenza A by PCR NEGATIVE NEGATIVE Final   Influenza B by PCR NEGATIVE NEGATIVE Final    Comment: (NOTE) The Xpert Xpress SARS-CoV-2/FLU/RSV plus assay is intended as an aid in the diagnosis of influenza from Nasopharyngeal swab specimens and should not be used as a sole basis for treatment. Nasal washings and aspirates are unacceptable for Xpert Xpress SARS-CoV-2/FLU/RSV testing.  Fact Sheet for Patients: bloggercourse.com  Fact Sheet for Healthcare Providers: seriousbroker.it  This test is not  yet approved or cleared by the [place]  FDA and has been authorized for detection and/or diagnosis of SARS-CoV-2 by FDA under an Emergency Use Authorization (EUA). This EUA will remain in effect (meaning this test can be used) for the duration of the COVID-19 declaration under Section 564(b)(1) of the Act, 21 U.S.C. section 360bbb-3(b)(1), unless the authorization is terminated or revoked.     Resp Syncytial Virus by PCR NEGATIVE NEGATIVE Final    Comment: (NOTE) Fact Sheet for Patients: bloggercourse.com  Fact Sheet for Healthcare Providers: seriousbroker.it  This test is not yet approved or cleared by the [place]  FDA and has been authorized for detection and/or diagnosis of SARS-CoV-2 by FDA under an Emergency Use Authorization (EUA). This EUA will remain in effect (meaning this test can be used) for the duration of the COVID-19 declaration under Section 564(b)(1) of the Act, 21 U.S.C. section 360bbb-3(b)(1), unless the authorization is terminated or revoked.  Performed at [facility], [phone]  [address] [place], [place] [address]   MRSA Next Gen by PCR, Nasal     Status: None   Collection Time: 06/15/24 11:45 PM   Specimen: Nasal Mucosa; Nasal Swab  Result Value Ref Range Status   MRSA by PCR Next Gen NOT DETECTED NOT DETECTED Final    Comment: (NOTE) The GeneXpert MRSA Assay (FDA approved for NASAL specimens only), is one component of a comprehensive MRSA colonization surveillance program. It is not intended to diagnose MRSA infection nor to guide or monitor treatment for MRSA infections. Test performance is not FDA approved in patients less than 48 years old. Performed at [facility], [address] [place], [place] [address]   Culture, Respiratory w Gram Stain     Status: None   Collection Time: 06/16/24 11:45 AM   Specimen: Tracheal Aspirate; Respiratory  Result Value Ref Range Status    Specimen Description   Final    TRACHEAL ASPIRATE Performed at [facility], [address] [place], [place] [address]    Special Requests   Final    NONE Performed at [facility], [address] [place], [place] [address]    Gram Stain   Final    ABUNDANT WBC PRESENT, PREDOMINANTLY PMN RARE GRAM POSITIVE COCCI    Culture   Final    FEW MORAXELLA CATARRHALIS(BRANHAMELLA) BETA LACTAMASE POSITIVE Performed at [facility], [address], [place], [place] [address]    Report Status 06/18/2024 FINAL  Final  Respiratory (~20 pathogens) panel by PCR     Status: None   Collection Time: 06/16/24  1:30 PM   Specimen: Nasopharyngeal Swab; Respiratory  Result Value Ref Range Status   Adenovirus NOT DETECTED NOT DETECTED Final   Coronavirus 229E NOT DETECTED NOT DETECTED Final    Comment: (NOTE) The Coronavirus on the Respiratory Panel, DOES NOT test for the novel  Coronavirus (2019 nCoV)    Coronavirus HKU1 NOT DETECTED NOT DETECTED Final   Coronavirus NL63 NOT DETECTED NOT DETECTED Final   Coronavirus OC43 NOT DETECTED NOT DETECTED Final   Metapneumovirus NOT DETECTED NOT DETECTED Final   Rhinovirus / Enterovirus NOT DETECTED NOT DETECTED Final   Influenza A NOT DETECTED NOT DETECTED Final   Influenza B NOT DETECTED NOT DETECTED Final   Parainfluenza Virus 1 NOT DETECTED NOT DETECTED Final   Parainfluenza Virus 2 NOT DETECTED NOT DETECTED Final   Parainfluenza Virus 3 NOT DETECTED NOT DETECTED Final   Parainfluenza Virus 4 NOT DETECTED NOT DETECTED Final   Respiratory Syncytial Virus NOT DETECTED NOT DETECTED Final   Bordetella pertussis NOT DETECTED NOT DETECTED Final   Bordetella Parapertussis NOT DETECTED NOT DETECTED Final   Chlamydophila pneumoniae NOT DETECTED NOT DETECTED Final   Mycoplasma pneumoniae NOT DETECTED NOT DETECTED Final    Comment: Performed at [facility], [address], [place], [place] [address]     Radiology  Studies: CT HEAD WO CONTRAST ( ) Result Date: 06/20/2024 EXAM: CT HEAD WITHOUT 06/20/2024 11:14:15 AM TECHNIQUE: CT of the head was performed without the administration of intravenous contrast. Automated exposure control, iterative reconstruction, and/or weight based adjustment of the mA/kV was utilized to reduce the radiation dose to as low as reasonably achievable. COMPARISON: 06/15/2024 CLINICAL HISTORY: Neuro deficit, acute, stroke suspected FINDINGS: BRAIN AND VENTRICLES: No acute intracranial hemorrhage. No mass effect or midline shift. No extra-axial fluid collection. No evidence of acute infarct. No hydrocephalus. Calcified atherosclerotic plaque within cavernous/supraclinoid internal carotid arteries. ORBITS: No  acute abnormality. SINUSES AND MASTOIDS: No acute abnormality. SOFT TISSUES AND SKULL: No acute skull fracture. No acute soft tissue abnormality. IMPRESSION: 1. No acute intracranial abnormality. 2. Calcified atherosclerotic plaque within cavernous/supraclinoid internal carotid arteries. Electronically signed by: Evalene Coho MD 06/20/2024 11:52 AM EST RP Workstation: [id number]    Scheduled Meds:  amiodarone   150 mg Intravenous Once   budesonide  (PULMICORT ) nebulizer solution  0.5 mg Nebulization BID   Chlorhexidine  Gluconate Cloth  6 each Topical Daily   insulin  aspart  0-5 Units Subcutaneous QHS   insulin  aspart  0-9 Units Subcutaneous TID WC   methylPREDNISolone  (SOLU-MEDROL ) injection  20 mg Intravenous Q12H   pantoprazole  (PROTONIX ) IV  40 mg Intravenous Q12H   potassium chloride   40 mEq Oral Once   sodium chloride  flush  10-40 mL Intracatheter Q12H   Continuous Infusions:  ampicillin -sulbactam (UNASYN ) IV 3 g (06/22/24 0316)     LOS: 7 days  MDM: Patient is high risk for one or more organ failure.  They necessitate ongoing hospitalization for continued IV therapies and subsequent lab monitoring. Total time spent interpreting labs and vitals, reviewing the medical  record, coordinating care amongst consultants and care team members, directly assessing and discussing care with the patient and/or family: 55 min  Sue Kroenke, DO [facility]  To contact the attending physician between 7A-7P please use Epic Chat. To contact the covering physician during after hours 7P-7A, please review Amion.  06/22/2024, 7:45 AM   *This document has been created with the assistance of dictation software. Please excuse typographical errors. *

## 2024-06-22 NOTE — Progress Notes (Signed)
 Nutrition Follow-up  DOCUMENTATION CODES:   Morbid obesity  INTERVENTION:   -Continue dysphagia 1 diet with thin liquids; RD will follow for diet advancement and adjust supplement regimen as appropriate  -MVI with minerals daily -Ensure Plus High Protein po BID, each supplement provides 350 kcal and 20 grams of protein  -Magic cup TID with meals, each supplement provides 290 kcal and 9 grams of protein  -Feeding assistance with meals   NUTRITION DIAGNOSIS:   Inadequate oral intake related to inability to eat (pt sedated and ventilated) as evidenced by NPO status.  Progressing; advanced to PO diet on 06/22/24  GOAL:   Patient will meet greater than or equal to 90% of their needs  Progressing   MONITOR:   Diet advancement  REASON FOR ASSESSMENT:   Ventilator    ASSESSMENT:   73 y/o female with h/o morbid obesity , chronic pain, Afib and CHF who is admitted with PNA, septic shock and GIB secondary to [employer] tear.  11/24- intubated 11/25- s/p EGD- revealed mallory weiss tear 11/27- extubated 12/1- s/p BSE- dysphagia 1 diet with thin liquids  Reviewed I/O's: -447 ml x 24 hours and +4 L since admission  UOP: 850 ml x 24 hours  Patient sitting up in bed at time of visit. She is more alert and interactive in comparison to prior visit. Patient complains of dry mouth and being hungry; states she has only been having mouth swabs and is requesting to eat. RD informed that RD will reach out to MD for potential diet advancement.   Case discussed with RN, MD, and SLP. Patient underwent BSE and was advanced to a dysphagia 1 diet with thin liquids as patient does not have access to her dentures and does not feel comfortable eating without them.   Weight has ranged from 127.9-140.6 kg since admission. Patient with deep pitting edema, which may be masking weight loss as well as fat and muscle depletions. Noted patient +4 L since admission.   Medications reviewed and include  solu-medrol .   Labs reviewed: Na: 150, Phos and K WDL, Mg: 3.2. CBGS: 101-125 (inpatient orders for glycemic control are 0-5 units insulin  aspart daily at bedtime and 0-9 units insulin  aspart TID with meals).    Diet Order:   Diet Order             DIET - DYS 1 Room service appropriate? No; Fluid consistency: Thin  Diet effective now                   EDUCATION NEEDS:   Not appropriate for education at this time  Skin:  Skin Assessment: Reviewed RN Assessment  Last BM:  06/22/24 (type 5)  Height:   Ht Readings from Last 1 Encounters:  06/21/24 5' 3 (1.6 m)    Weight:   Wt Readings from Last 1 Encounters:  06/22/24 134.8 kg    Ideal Body Weight:  52.2 kg  BMI:  Body mass index is 52.64 kg/m.  Estimated Nutritional Needs:   Kcal:  1800-2000  Protein:  105-120 grams  Fluid:  1.8-2.0 L    Margery ORN, RD, [name], CDCES Registered Dietitian III Certified Diabetes Care and Education Specialist If unable to reach this RD, please use RD Inpatient group chat on secure chat between hours of 8am-4 pm daily

## 2024-06-22 NOTE — Evaluation (Signed)
 Clinical/Bedside Swallow Evaluation Patient Details  Name: [name] MRN: [id number] Date of Birth: October 10, 1950  Today's Date: 06/22/2024 Time: SLP Start Time (ACUTE ONLY): 1150 SLP Stop Time (ACUTE ONLY): 1220 SLP Time Calculation (min) (ACUTE ONLY): 30 min  Past Medical History:  Past Medical History:  Diagnosis Date   History of shingles    Knee pain    Migraines    Past Surgical History:  Past Surgical History:  Procedure Laterality Date   COLONOSCOPY WITH PROPOFOL  N/A 02/19/2020   Procedure: COLONOSCOPY WITH PROPOFOL ;  Surgeon: Janalyn Keene NOVAK, MD;  Location: [facility] ENDOSCOPY;  Service: Endoscopy;  Laterality: N/A;   ESOPHAGOGASTRODUODENOSCOPY N/A 06/16/2024   Procedure: EGD (ESOPHAGOGASTRODUODENOSCOPY);  Surgeon: Jinny Carmine, MD;  Location: [facility] ENDOSCOPY;  Service: Endoscopy;  Laterality: N/A;   REPLACEMENT TOTAL KNEE     left knee   TUBAL LIGATION     HPI:  Patient is a 73 year old female with acute hypoxic respiratory failure s/t suspected aspiration pneumonia in the setting of suspected acute upper GIB requiring vent support. Intubated 11/24-27. PMH: migraines, atrial fibrillation.   CXR 11/26:  Improved aeration of both lungs with persistent airspace opacities in the left mid and lower lung zones. Likely trace left pleural effusion.  CT Head 11/29: No acute intracranial abnormality. Calcified atherosclerotic plaque within cavernous/supraclinoid internal carotid arteries   Assessment / Plan / Recommendation  Clinical Impression  Pt seen for bedside swallow assessment in the setting of initial concern for aspiration PNA, with pt now s/p intubation from 11/24-11/27. EGD completed on 11/25, revealing, Mallory-Weiss tear without active bleeding. Pt with no history of dysphagia.   Upon therapist arrival, pt resting in bed on 2L nasal canula. O2 saturations in the mid to high 90s for duration of session. Pt reporting significant dry mouth aided by oral care. RN  assisted with repositioning to increase safety for intake. Noted, congested cough in the absence of PO intake, with raspy, hoarse vocal quality. Pt edentulous- dentures not present in the room. Pt seen with trials of ice chips, thin liquids (via straw), puree, and mech soft solids. No change to vocal quality across trials. Vitals stable for duration of trials. Single delayed, congested cough noted following large sip of thin liquids. Oral phase directly impacted by lack of dentition- with pt reporting discomfort with mastication of solids without dentures in place. Impulsivity noted with straw sips of thin liquids  Based on current debility/deconditioning and recent intubation, pt is at increased risk of aspiration, therefore recommend aspiration precautions (slow rate, small bites, elevated HOB, and alert for PO intake). Recommend thin liquids and puree solids. SLP will monitor for continued safety with current diet. MD and RN aware of recommendations.   Regarding cognitive communication- pt with much improved engagement/attention. Physician reporting that acute metabolic encephalopathy is resolved in progress note. As mentioned in cognitive assessment, suspect that mentation will continue to improve with overall medical stability. No further cognitive intervention indicated.   SLP Visit Diagnosis: Dysphagia, unspecified (R13.10) (related to mentation, recent intubation)    Aspiration Risk  Moderate aspiration risk    Diet Recommendation   Thin;Dysphagia 1 (puree)  Medication Administration: Whole meds with puree    Other  Recommendations Oral Care Recommendations: Oral care BID;Staff/trained caregiver to provide oral care     Assistance Recommended at Discharge  Supervision and assist as needed for PO intake  Functional Status Assessment Patient has had a recent decline in their functional status and demonstrates the ability to make  significant improvements in function in a reasonable and  predictable amount of time.  Frequency and Duration min 2x/week  2 weeks       Prognosis Prognosis for improved oropharyngeal function: Good Barriers to Reach Goals: Cognitive deficits (though improved from cognitive evaluation)      Swallow Study   General Date of Onset: 06/22/24 HPI: Patient is a 73 year old female with acute hypoxic respiratory failure s/t suspected aspiration pneumonia in the setting of suspected acute upper GIB requiring vent support. Intubated 11/24-27. PMH: migraines, atrial fibrillation. Type of Study: Bedside Swallow Evaluation Previous Swallow Assessment: none in chart Diet Prior to this Study: NPO Temperature Spikes Noted: No (WBC 9.2) Respiratory Status: Nasal cannula (2L) History of Recent Intubation: Yes Total duration of intubation (days): 3 days Date extubated: 06/18/24 Behavior/Cognition: Alert;Confused;Impulsive;Requires cueing Oral Cavity Assessment: Within Functional Limits Oral Care Completed by SLP: Yes Oral Cavity - Dentition: Edentulous (dentures not available in room) Vision: Functional for self-feeding Self-Feeding Abilities: Able to feed self;Needs set up Patient Positioning: Upright in bed Baseline Vocal Quality: Normal Volitional Cough: Congested Volitional Swallow: Able to elicit    Oral/Motor/Sensory Function Overall Oral Motor/Sensory Function: Within functional limits   Ice Chips Ice chips: Within functional limits Presentation: Spoon   Thin Liquid Thin Liquid: Within functional limits Presentation: Cup;Straw;Spoon    Nectar Thick Nectar Thick Liquid: Not tested   Honey Thick Honey Thick Liquid: Not tested   Puree Puree: Within functional limits Presentation: Spoon   Solid     Solid: Impaired Presentation: Spoon Oral Phase Impairments: Impaired mastication Oral Phase Functional Implications: Impaired mastication Pharyngeal Phase Impairments:  (none)     Sue Petrea Clapp, MS, CCC-SLP Speech Language  Pathologist Rehab Services; [facility] [phone] (ascom)   Xiadani Damman J Porter 06/22/2024,1:41 PM

## 2024-06-22 NOTE — Consult Note (Signed)
 PHARMACY CONSULT NOTE - ELECTROLYTES  Pharmacy Consult for Electrolyte Monitoring and Replacement   Recent Labs: Height: 5' 3 (160 cm) Weight: 134.8 kg (297 lb 2.9 oz) IBW/kg (Calculated) : 52.4 Estimated Creatinine Clearance: 61.2 mL/min (A) (by C-G formula based on SCr of 1.12 mg/dL (H)). Potassium (mmol/L)  Date Value  06/22/2024 3.6   Magnesium  (mg/dL)  Date Value  87/98/7974 3.2 (H)   Calcium (mg/dL)  Date Value  87/98/7974 9.5   Albumin (g/dL)  Date Value  88/69/7974 4.1  10/25/2021 4.2   Phosphorus (mg/dL)  Date Value  88/69/7974 3.4   Sodium (mmol/L)  Date Value  06/22/2024 150 (H)  10/25/2021 144    Assessment  [name] is a 72 y.o. female presenting with hematemesis. PMH significant for pulmonary hypertension, thrombocytopenia, hypertension, CAD, polyneuropathy, atrial fibrillation, cardiomyopathy. Pharmacy has been consulted to monitor and replace electrolytes.  Goal of Therapy:  Potassium 4.0 - 5.1 mmol/L Magnesium  2.0 - 2.4 mg/dL All Other Electrolytes WNL  Plan:  K 3.6, Will give KCL 40 mEq PO x 1 Check BMP, Mg, Phos with AM labs  Thank you for allowing pharmacy to be a part of this patient's care.  Lum VEAR Mania, PharmD Clinical Pharmacist 06/22/2024 7:18 AM

## 2024-06-22 NOTE — Progress Notes (Signed)
 Physical Therapy Treatment Patient Details Name: [name] MRN: [id number] DOB: [birth year] Today's Date: 06/22/2024   History of Present Illness Patient is a 73 year old female with acute hypoxic respiratory failure s/t suspected aspiration pneumonia in the setting of suspected acute upper GIB requiring vent support. Extubated 11/27. PMH: migraines, atrial fibrillation.    PT Comments  Pt seen for PT evaluation with pt agreeable, co-tx with OT for pt & therapists' safety. Pt is pleasantly confused throughout session, follows simple commands inconsistently with extra time. Pt is able to progress to performing transfers with min<>mod assist +2 depending upon surface height, short distance gait with chair follow. Recommend ongoing PT services to progress mobility as able. Continue to recommend post acute rehab <3 hours therapy/day upon d/c.   If plan is discharge home, recommend the following: A lot of help with walking and/or transfers;A lot of help with bathing/dressing/bathroom;Assistance with cooking/housework;Supervision due to cognitive status;Help with stairs or ramp for entrance;Assist for transportation   Can travel by private vehicle     No  Equipment Recommendations  Other (comment) (defer to next venue)    Recommendations for Other Services       Precautions / Restrictions Precautions Precautions: Fall Restrictions Weight Bearing Restrictions Per Provider Order: No     Mobility  Bed Mobility Overal bed mobility: Needs Assistance Bed Mobility: Supine to Sit     Supine to sit: Mod assist, Used rails, HOB elevated, +2 for safety/equipment (exited R side of bed)          Transfers Overall transfer level: Needs assistance Equipment used: Rolling walker (2 wheels) Transfers: Sit to/from Stand, Bed to chair/wheelchair/BSC Sit to Stand: Mod assist, Min assist, +2 physical assistance, +2 safety/equipment, From elevated surface   Step pivot transfers: Min assist, +2  physical assistance, +2 safety/equipment, From elevated surface (bed>recliner on R with assistance for RW management)       General transfer comment: min assist +2 for sit>stand from elevated EOB, mod assist +2 for sit>stand from recliner, cuing re: hand placement, extra time to power up to standing    Ambulation/Gait Ambulation/Gait assistance: Min assist, +2 safety/equipment, +2 physical assistance Gait Distance (Feet): 5 Feet Assistive device: Rolling walker (2 wheels) Gait Pattern/deviations: Decreased step length - right, Decreased step length - left, Decreased stride length Gait velocity: decreased     General Gait Details: Pt ambulated a few feet in room with RW & min assist +2 with chair follow, heavy lean on RW with BUE.   Stairs             Wheelchair Mobility     Tilt Bed    Modified Rankin (Stroke Patients Only)       Balance Overall balance assessment: Needs assistance Sitting-balance support: Feet supported Sitting balance-Leahy Scale: Fair Sitting balance - Comments: Pt requiring cuing for midline orientation as pt leaning all directions   Standing balance support: Bilateral upper extremity supported, Reliant on assistive device for balance, During functional activity Standing balance-Leahy Scale: Poor                              Communication Communication Communication: Impaired Factors Affecting Communication: Reduced clarity of speech  Cognition Arousal: Alert Behavior During Therapy: Restless   PT - Cognitive impairments: Difficult to assess, Safety/Judgement, Problem solving, Awareness  Following commands: Impaired Following commands impaired: Follows one step commands with increased time    Cueing Cueing Techniques: Verbal cues, Gestural cues, Tactile cues, Visual cues  Exercises      General Comments General comments (skin integrity, edema, etc.): VSS, granddaughter arriving at end of  session      Pertinent Vitals/Pain Pain Assessment Pain Assessment: No/denies pain    Home Living                          Prior Function            PT Goals (current goals can now be found in the care plan section) Acute Rehab PT Goals Patient Stated Goal: to go back home PT Goal Formulation: With family Time For Goal Achievement: 07/03/24 Potential to Achieve Goals: Good Progress towards PT goals: Progressing toward goals    Frequency    Min 2X/week      PT Plan      Co-evaluation PT/OT/SLP Co-Evaluation/Treatment: Yes Reason for Co-Treatment: For patient/therapist safety;Complexity of the patient's impairments (multi-system involvement);To address functional/ADL transfers PT goals addressed during session: Mobility/safety with mobility;Balance        AM-PAC PT 6 Clicks Mobility   Outcome Measure  Help needed turning from your back to your side while in a flat bed without using bedrails?: A Lot Help needed moving from lying on your back to sitting on the side of a flat bed without using bedrails?: Total Help needed moving to and from a bed to a chair (including a wheelchair)?: A Lot Help needed standing up from a chair using your arms (e.g., wheelchair or bedside chair)?: A Lot Help needed to walk in hospital room?: Total Help needed climbing 3-5 steps with a railing? : Total 6 Click Score: 9    End of Session Equipment Utilized During Treatment: Oxygen Activity Tolerance: Patient tolerated treatment well Patient left: in chair;with chair alarm set;with call bell/phone within reach;with family/visitor present Nurse Communication: Mobility status PT Visit Diagnosis: Muscle weakness (generalized) (M62.81);Unsteadiness on feet (R26.81);Other abnormalities of gait and mobility (R26.89);Difficulty in walking, not elsewhere classified (R26.2)     Time: 8589-8561 PT Time Calculation (min) (ACUTE ONLY): 28 min  Charges:    $Therapeutic Activity:  8-22 mins PT General Charges $$ ACUTE PT VISIT: 1 Visit                     Richerd Pinal, PT, DPT 06/22/24, 2:52 PM   Richerd CHRISTELLA Pinal 06/22/2024, 2:51 PM

## 2024-06-22 NOTE — NC FL2 (Addendum)
 [place]  MEDICAID FL2 LEVEL OF CARE FORM     IDENTIFICATION  Patient Name: [name] Birthdate: 1951/05/06 Sex: female Admission Date (Current Location): 06/15/2024  [facility] and [place] Number:  Chiropodist and Address:  [facility], [address] [place], [place] [address]      Provider Number: [id number]  Attending Physician Name and Address:  Leesa Kast, DO  Relative Name and Phone Number:  Tyreona, Panjwani)  [phone]    Current Level of Care: Hospital Recommended Level of Care: Skilled Nursing Facility Prior Approval Number:    Date Approved/Denied:   PASRR Number:  [id number]  Discharge Plan: SNF    Current Diagnoses: Patient Active Problem List   Diagnosis Date Noted   Septic shock (HCC) 06/16/2024   Hematemesis 06/16/2024   Mallory-Weiss tear 06/16/2024   GIB (gastrointestinal bleeding) 06/15/2024   Atelectasis 09/04/2021   Atrial fibrillation (HCC) 08/24/2020   Pneumonia due to COVID-19 virus 08/24/2020   (HFpEF) heart failure with preserved ejection fraction (HCC) 08/24/2020   Viral URI 08/03/2020   Postherpetic neuralgia 03/29/2020   Grief 03/29/2020   Cough 03/29/2020   Encounter for screening colonoscopy    Elevated blood pressure reading 01/19/2020   Osteoarthritis of right knee 12/21/2019   Morbid obesity due to excess calories (HCC) 08/13/2015   Chronic pain of right knee 08/13/2015    Orientation RESPIRATION BLADDER Height & Weight     Self, Time    External catheter Weight: 134.8 kg Height:  5' 3 (160 cm)  BEHAVIORAL SYMPTOMS/MOOD NEUROLOGICAL BOWEL NUTRITION STATUS      Incontinent Diet (Dysphagia I Diet with thin liquids)  AMBULATORY STATUS COMMUNICATION OF NEEDS Skin   Extensive Assist Verbally                         Personal Care Assistance Level of Assistance  Feeding, Dressing, Bathing Bathing Assistance: Maximum assistance Feeding assistance: Limited  assistance Dressing Assistance: Maximum assistance     Functional Limitations Info             SPECIAL CARE FACTORS FREQUENCY                       Contractures      Additional Factors Info  Code Status, Allergies Code Status Info: FULL Allergies Info: NKA           Current Medications (06/22/2024):  This is the current hospital active medication list Current Facility-Administered Medications  Medication Dose Route Frequency Provider Last Rate Last Admin   acetaminophen  (TYLENOL ) suppository 650 mg  650 mg Rectal Q4H PRN Bousman, Karlie, PA-C   650 mg at 06/16/24 9186   Or   acetaminophen  (TYLENOL ) tablet 650 mg  650 mg Per Tube Q4H PRN Bousman, Karlie, PA-C       amiodarone  (NEXTERONE ) 1.8 mg/mL load via infusion 150 mg  150 mg Intravenous Once Hudson, Caralyn, PA-C       Ampicillin -Sulbactam (UNASYN ) 3 g in sodium chloride  0.9 % 100 mL IVPB  3 g Intravenous Q6H Kasa, Kurian, MD 200 mL/hr at 06/22/24 0836 3 g at 06/22/24 0836   budesonide  (PULMICORT ) nebulizer solution 0.5 mg  0.5 mg Nebulization BID Kasa, Kurian, MD   0.5 mg at 06/22/24 0759   Chlorhexidine  Gluconate Cloth 2 % PADS 6 each  6 each Topical Daily Rust-Chester, Jenita CROME, NP   6 each at 06/22/24 1159   feeding supplement (ENSURE PLUS  HIGH PROTEIN) liquid 237 mL  237 mL Oral BID BM Dezii, Alexandra, DO       fentaNYL  (SUBLIMAZE ) injection 25-50 mcg  25-50 mcg Intravenous Q2H PRN Kathrene Almarie Bake, NP   25 mcg at 06/21/24 1954   HYDROmorphone (DILAUDID) injection 1 mg  1 mg Intravenous Q2H PRN Kathrene Almarie Bake, NP   1 mg at 06/22/24 9167   insulin  aspart (novoLOG ) injection 0-5 Units  0-5 Units Subcutaneous QHS Kathrene Almarie Bake, NP       insulin  aspart (novoLOG ) injection 0-9 Units  0-9 Units Subcutaneous TID WC Ouma, Elizabeth Achieng, NP   1 Units at 06/20/24 1619   ipratropium-albuterol  (DUONEB) 0.5-2.5 (3) MG/3ML nebulizer solution 3 mL  3 mL Nebulization Q6H PRN Isaiah Scrivener, MD        methylPREDNISolone  sodium succinate  (SOLU-MEDROL ) 40 mg/mL injection 20 mg  20 mg Intravenous Q12H Kasa, Kurian, MD   20 mg at 06/22/24 0401   multivitamin with minerals tablet 1 tablet  1 tablet Oral Daily Dezii, Lorane, DO       Oral care mouth rinse  15 mL Mouth Rinse PRN Ouma, Elizabeth Achieng, NP       pantoprazole  (PROTONIX ) injection 40 mg  40 mg Intravenous Q12H Paduchowski, Kevin, MD   40 mg at 06/22/24 9167   pneumococcal 20-valent conjugate vaccine (PREVNAR 20) injection 0.5 mL  0.5 mL Intramuscular Prior to discharge Isaiah Scrivener, MD       sodium chloride  flush (NS) 0.9 % injection 10-40 mL  10-40 mL Intracatheter Q12H Shellia Mann D, NP   10 mL at 06/22/24 0841   sodium chloride  flush (NS) 0.9 % injection 10-40 mL  10-40 mL Intracatheter PRN Keene, Jeremiah D, NP         Discharge Medications: Please see discharge summary for a list of discharge medications.  Relevant Imaging Results:  Relevant Lab Results:   Additional Information SSN    [id number]  Dalia GORMAN Fuse, RN

## 2024-06-23 DIAGNOSIS — K226 Gastro-esophageal laceration-hemorrhage syndrome: Secondary | ICD-10-CM | POA: Diagnosis not present

## 2024-06-23 DIAGNOSIS — A419 Sepsis, unspecified organism: Secondary | ICD-10-CM | POA: Diagnosis not present

## 2024-06-23 DIAGNOSIS — R6521 Severe sepsis with septic shock: Secondary | ICD-10-CM | POA: Diagnosis not present

## 2024-06-23 LAB — CULTURE, BLOOD (ROUTINE X 2): Special Requests: ADEQUATE

## 2024-06-23 LAB — GLUCOSE, CAPILLARY
Glucose-Capillary: 26 mg/dL — CL (ref 70–99)
Glucose-Capillary: 96 mg/dL (ref 70–99)
Glucose-Capillary: 76 mg/dL (ref 70–99)
Glucose-Capillary: 74 mg/dL (ref 70–99)
Glucose-Capillary: 69 mg/dL — ABNORMAL LOW (ref 70–99)
Glucose-Capillary: 96 mg/dL (ref 70–99)
Glucose-Capillary: 87 mg/dL (ref 70–99)

## 2024-06-23 LAB — BASIC METABOLIC PANEL WITH GFR
Anion gap: 10 (ref 5–15)
BUN: 46 mg/dL — ABNORMAL HIGH (ref 8–23)
CO2: 33 mmol/L — ABNORMAL HIGH (ref 22–32)
Calcium: 9.3 mg/dL (ref 8.9–10.3)
Chloride: 102 mmol/L (ref 98–111)
Creatinine, Ser: 0.94 mg/dL (ref 0.44–1.00)
GFR, Estimated: >60 mL/min (ref >60)
Glucose, Bld: 94 mg/dL (ref 70–99)
Potassium: 3.9 mmol/L (ref 3.5–5.1)
Sodium: 145 mmol/L (ref 135–145)

## 2024-06-23 MED ORDER — METHYLPREDNISOLONE SODIUM SUCC 40 MG IJ SOLR
20.0000 mg | Freq: Every day | INTRAMUSCULAR | Status: DC
  Administered 2024-06-24: 20 mg via INTRAVENOUS
  Filled 2024-06-23: qty 1

## 2024-06-23 MED ORDER — POTASSIUM CHLORIDE CRYS ER 20 MEQ PO TBCR
40.0000 meq | EXTENDED_RELEASE_TABLET | Freq: Once | ORAL | Status: AC
  Administered 2024-06-23: 40 meq via ORAL
  Filled 2024-06-23: qty 2

## 2024-06-23 NOTE — Progress Notes (Signed)
 Patient given orange juice for blood sugar of 69. Pt asymptomatic at this time.

## 2024-06-23 NOTE — Progress Notes (Signed)
 Physical Therapy Treatment Patient Details Name: ANIE JUNIEL MRN: [id number] DOB: [birth year] Today's Date: 06/23/2024   History of Present Illness Patient is a 73 year old female with acute hypoxic respiratory failure s/t suspected aspiration pneumonia in the setting of suspected acute upper GIB requiring vent support. Extubated 11/27. PMH: migraines, atrial fibrillation.    PT Comments  Patient continues to require assistance with mobility. She is fatigued with limited activity. She is far from her baseline level of independence and could benefit from continued PT to maximize independence and facilitate return to prior level of function.    If plan is discharge home, recommend the following: A lot of help with walking and/or transfers;A lot of help with bathing/dressing/bathroom;Assistance with cooking/housework;Supervision due to cognitive status;Help with stairs or ramp for entrance;Assist for transportation   Can travel by private vehicle     No  Equipment Recommendations  Other (comment) (TBD at next level of care)    Recommendations for Other Services       Precautions / Restrictions Precautions Precautions: Fall Recall of Precautions/Restrictions: Impaired Restrictions Weight Bearing Restrictions Per Provider Order: No     Mobility  Bed Mobility Overal bed mobility: Needs Assistance Bed Mobility: Supine to Sit     Supine to sit: Max assist Sit to supine: Max assist   General bed mobility comments: assistance for LE and trunk support. cues for sequencing    Transfers Overall transfer level: Needs assistance                Lateral/Scoot Transfers: Max assist General transfer comment: cues for technique. anticipate +2 will be needed for standing attempts due to weakness    Ambulation/Gait                   Stairs             Wheelchair Mobility     Tilt Bed    Modified Rankin (Stroke Patients Only)       Balance Overall  balance assessment: Needs assistance Sitting-balance support: Feet supported Sitting balance-Leahy Scale: Fair                                      Hotel Manager: Impaired Factors Affecting Communication: Reduced clarity of speech  Cognition Arousal: Alert Behavior During Therapy: Restless   PT - Cognitive impairments: No family/caregiver present to determine baseline, Safety/Judgement, Awareness Difficult to assess due to: Impaired communication                       Following commands: Impaired Following commands impaired: Follows one step commands with increased time    Cueing Cueing Techniques: Verbal cues, Gestural cues, Tactile cues, Visual cues  Exercises      General Comments General comments (skin integrity, edema, etc.): patient labile at times. assisted with calling daugther Sari on the phone at end of session      Pertinent Vitals/Pain Pain Assessment Pain Assessment: No/denies pain    Home Living                          Prior Function            PT Goals (current goals can now be found in the care plan section) Acute Rehab PT Goals Patient Stated Goal: to go back home PT Goal Formulation: With family Time  For Goal Achievement: 07/03/24 Potential to Achieve Goals: Good Progress towards PT goals: Progressing toward goals    Frequency    Min 2X/week      PT Plan      Co-evaluation              AM-PAC PT 6 Clicks Mobility   Outcome Measure  Help needed turning from your back to your side while in a flat bed without using bedrails?: A Lot Help needed moving from lying on your back to sitting on the side of a flat bed without using bedrails?: Total Help needed moving to and from a bed to a chair (including a wheelchair)?: A Lot Help needed standing up from a chair using your arms (e.g., wheelchair or bedside chair)?: A Lot Help needed to walk in hospital room?: Total Help  needed climbing 3-5 steps with a railing? : Total 6 Click Score: 9    End of Session   Activity Tolerance: Patient tolerated treatment well;Patient limited by fatigue Patient left: in bed;with call bell/phone within reach;with bed alarm set   PT Visit Diagnosis: Muscle weakness (generalized) (M62.81);Unsteadiness on feet (R26.81);Other abnormalities of gait and mobility (R26.89);Difficulty in walking, not elsewhere classified (R26.2)     Time: 8564-8547 PT Time Calculation (min) (ACUTE ONLY): 17 min  Charges:    $Therapeutic Activity: 8-22 mins PT General Charges $$ ACUTE PT VISIT: 1 Visit                     Randine Essex, PT, MPT    Randine LULLA Essex 06/23/2024, 2:57 PM

## 2024-06-23 NOTE — Progress Notes (Addendum)
 PROGRESS NOTE    [name]  FMW:[id number] DOB: 11-13-1950 DOA: 06/15/2024 PCP: Odell Tor Edra [name], MD  Chief Complaint  Patient presents with   Hematemesis    Hospital Course:  [name] 73 year old female who presented with cough, and hematemesis.  She was in acute hypoxic respiratory failure secondary to suspected aspiration pneumonia in the setting of upper GI bleed.  She required intubation and was admitted to the ICU.  On 11/25 she underwent EGD which revealed Mallory-Weiss tear without active bleeding.  She was successfully extubated on 11/27.  She remained on pressors until 11/29.  ICU stay further complicated by A-fib with RVR and decompensated heart failure.  She was started on amiodarone  drip.  Cardiology was consulted.  On 12/1 she was transferred to the TRH service. 12/1: Patient alert and oriented x 3.  Participated with speech therapy eval.  Started on diet 12/2: Doing well, working with PT/OT. Pending SNF  Subjective: No acute events overnight. Pt feels ready to go home. We discussed current level of deconditioning and recommendation of SNF, she agrees with this recommendation   Objective: Vitals:   06/22/24 2016 06/23/24 0002 06/23/24 0523 06/23/24 0740  BP:  (!) 159/74 (!) 156/67 (!) 154/82  Pulse:  77 75 60  Resp:  15 16 17   Temp:  (!) 97.3 F (36.3 C) (!) 97.4 F (36.3 C) (!) 97.5 F (36.4 C)  TempSrc:  Oral Oral   SpO2: 96% 97% 100% 99%  Weight:   (!) 136.4 kg   Height:        Intake/Output Summary (Last 24 hours) at 06/23/2024 0747 Last data filed at 06/23/2024 0002 Gross per 24 hour  Intake --  Output 800 ml  Net -800 ml   Filed Weights   06/21/24 2239 06/22/24 0500 06/23/24 0523  Weight: 135.5 kg 134.8 kg (!) 136.4 kg    Examination: General exam: Appears calm and comfortable, NAD, appears older than stated age Respiratory system: No work of breathing, rhonchorous upper airway sounds, cough Cardiovascular system: S1 & S2  heard, RRR.  Gastrointestinal system: Abdomen is nondistended, soft and nontender.  Neuro: Alert and oriented. No focal neurological deficits. Extremities: Symmetric, expected ROM Skin: No rashes, lesions Psychiatry: Demonstrates appropriate judgement and insight. Mood & affect appropriate for situation.   Assessment & Plan:  Principal Problem:   GIB (gastrointestinal bleeding) Active Problems:   Septic shock (HCC)   Hematemesis   Mallory-Weiss tear    Acute hypoxic respiratory failure - Secondary to aspiration pneumonia and upper GI bleed - Extubated 11/26 - Continue supplemental O2, continue wean as tolerated - Continue DuoNebs - Tapering Solu-Medrol   Septic shock, resolved - Required pressors while in the ICU.  Blood pressure stable now  Aspiration pneumonia - Status post 7 days Unasyn  - Fever curve and WBC improved - Continue aspiration precautions - Initial procalcitonin 18.2  Paroxysmal A-fib with RVR - Initiated on amiodarone  drip in ICU, appears she is not on anything for maintenance now.  Currently rate controlled - History of A-fib, not on Eliquis  at home due to expense - Eliquis  not resumed during this admission due to active GI bleed  Acute on chronic diastolic heart failure Pulmonary hypertension - proBNP 6725 - Echocardiogram 06/16/2024: LVEF 60 to 65%, grade 2 diastolic dysfunction, moderately elevated pulmonary arterial systolic pressure, tricuspid regurg with right atrial pressure of 15 mmHg - Clinically euvolemic now.  As needed Lasix . - Will need close outpatient follow-up with cardiology  Acute upper  GI bleed Acute blood loss anemia Mallory-Weiss tear - Mallory-Weiss tear seen on EGD 11/25. - Initial CTA GI bleed negative - Continue to monitor hemoglobin, transfuse if less than 7  AKI - Baseline creatinine 0.9, peak at 1.22 this admission - Improved to baseline now  Acute metabolic encephalopathy, resolved - Secondary to septic shock and  prolonged ICU stay - Head CT 11/24 without acute abnormality - Repeat head CT 11/29 no acute intracranial abnormality - Patient is alert and oriented x 4 today.  Anticipate she would do much better with speech therapy eval he can likely start diet.  Have reached back out to SLP for reevaluation - Frequent reorientation, Delirium precautions  Hypernatremia, resolved - Sodium 150 on 12/1.  Lasix  discontinued.  Unasyn  finished. - Resolved now.  Body mass index is 53.27 kg/m. Obesity Class III - Outpatient follow up for lifestyle modification and risk factor management  Abnormal TSH - TSH 6.380, T4 and T3 WNL - Can follow-up outpatient with PCP to repeat these labs when she is not acutely ill.  DVT prophylaxis: SCDs   Code Status: Full Code Disposition: Medically ready to DC, pending SNF choice.    Consultants:    Procedures:    Antimicrobials:  Anti-infectives (From admission, onward)    Start     Dose/Rate Route Frequency Ordered Stop   06/16/24 1215  doxycycline  (VIBRAMYCIN ) 100 mg in sodium chloride  0.9 % 250 mL IVPB        100 mg 125 mL/hr over 120 Minutes Intravenous 2 times daily 06/16/24 1120 06/20/24 2359   06/16/24 1100  azithromycin  (ZITHROMAX ) 500 mg in sodium chloride  0.9 % 250 mL IVPB  Status:  Discontinued        500 mg 250 mL/hr over 60 Minutes Intravenous Every 24 hours 06/16/24 1008 06/16/24 1120   06/16/24 0800  Ampicillin -Sulbactam (UNASYN ) 3 g in sodium chloride  0.9 % 100 mL IVPB        3 g 200 mL/hr over 30 Minutes Intravenous Every 6 hours 06/16/24 0056 06/22/24 2121   06/16/24 0130  vancomycin  (VANCOREADY) IVPB 2000 mg/400 mL        2,000 mg 200 mL/hr over 120 Minutes Intravenous  Once 06/16/24 0032 06/16/24 0335   06/15/24 2200  erythromycin  250 mg in sodium chloride  0.9 % 100 mL IVPB        250 mg 100 mL/hr over 60 Minutes Intravenous Once 06/15/24 2050 06/16/24 0132   06/15/24 2115  ceFEPIme  (MAXIPIME ) 2 g in sodium chloride  0.9 % 100 mL IVPB         2 g 200 mL/hr over 30 Minutes Intravenous  Once 06/15/24 2103 06/16/24 0131   06/15/24 2115  metroNIDAZOLE  (FLAGYL ) IVPB 500 mg        500 mg 100 mL/hr over 60 Minutes Intravenous  Once 06/15/24 2103 06/16/24 0226   06/15/24 2115  vancomycin  (VANCOCIN ) IVPB 1000 mg/200 mL premix  Status:  Discontinued        1,000 mg 200 mL/hr over 60 Minutes Intravenous  Once 06/15/24 2103 06/16/24 0032       Data Reviewed: I have personally reviewed following labs and imaging studies CBC: Recent Labs  Lab 06/16/24 0944 06/17/24 0330 06/18/24 0335 06/19/24 0250 06/20/24 0515 06/21/24 0352 06/22/24 0500  WBC 22.2*   < > 14.6* 8.6 9.0 9.2 9.2  NEUTROABS 16.5*  --   --   --   --   --   --   HGB 11.2*   < >  10.1* 9.9* 10.5* 10.8* 10.7*  HCT 34.3*   < > 30.0* 30.5* 32.6* 34.6* 34.5*  MCV 95.0   < > 92.9 93.6 96.7 99.1 97.7  PLT 119*   < > 104* 107* 106* 105* 110*   < > = values in this interval not displayed.   Basic Metabolic Panel: Recent Labs  Lab 06/17/24 0330 06/18/24 0335 06/18/24 0355 06/19/24 0250 06/19/24 1407 06/19/24 2000 06/20/24 0515 06/20/24 2205 06/21/24 0352 06/22/24 0500 06/23/24 0500  NA 134* 136  --  140  --   --  146* 145 146* 150* 145  K 3.9 3.3*  --  3.1* 3.2*   < > 3.4* 3.9 4.0 3.6 3.9  CL 101 99  --  99  --   --  106 104 103 105 102  CO2 20* 23  --  27  --   --  29 29 30  33* 33*  GLUCOSE 132* 130*  --  123*  --   --  128* 122* 130* 113* 94  BUN 26* 32*  --  34*  --   --  35* 41* 42* 50* 46*  CREATININE 1.22* 1.12*  --  1.16*  --   --  1.04* 1.04* 1.08* 1.12* 0.94  CALCIUM 7.7* 8.0*  --  8.4*  --   --  8.5* 9.0 9.1 9.5 9.3  MG 1.6*  --  2.1  --  2.5*  --  2.7*  --  3.0* 3.2*  --   PHOS 4.5 3.3  --  2.9  --   --  2.4*  --  3.4  --   --    < > = values in this interval not displayed.   GFR: Estimated Creatinine Clearance: 73.4 mL/min (by C-G formula based on SCr of 0.94 mg/dL). Liver Function Tests: Recent Labs  Lab 06/17/24 0330 06/18/24 0335  06/19/24 0250 06/20/24 0515 06/21/24 0352  ALBUMIN 3.4* 3.4* 3.7 3.8 4.1   CBG: Recent Labs  Lab 06/22/24 1736 06/22/24 2143 06/23/24 0517 06/23/24 0524 06/23/24 0742  GLUCAP 102* 116* 26* 96 76    Recent Results (from the past 240 hours)  Blood Culture (routine x 2)     Status: None (Preliminary result)   Collection Time: 06/15/24 10:03 PM   Specimen: BLOOD  Result Value Ref Range Status   Specimen Description   Final    BLOOD LEFT ANTECUBITAL Performed at [facility], [address], [place], [place] [address]    Special Requests   Final    BOTTLES DRAWN AEROBIC AND ANAEROBIC Blood Culture adequate volume Performed at [facility], [address], [place], [place] [address]    Culture  Setup Time   Final    GRAM POSITIVE RODS AEROBIC BOTTLE ONLY CRITICAL RESULT CALLED TO, READ BACK BY AND VERIFIED WITH:  LISA KLUTTZ AT 2339 06/18/24 JG Performed at [facility], [address] [place], [place] [address]    Culture   Final    VONNE POSITIVE RODS CRITICAL RESULT CALLED TO, READ BACK BY AND VERIFIED WITH: PHARMD MERRILL, K. 1201 AT 1128, ADC CULTURE REINCUBATED FOR BETTER GROWTH Performed at [facility], [address], [place], [place] [address]    Report Status PENDING  Incomplete  Blood Culture (routine x 2)     Status: None   Collection Time: 06/15/24 10:03 PM   Specimen: BLOOD  Result Value Ref Range Status   Specimen Description BLOOD RIGHT ANTECUBITAL  Final  Special Requests   Final    BOTTLES DRAWN AEROBIC AND ANAEROBIC Blood Culture results may not be optimal due to an inadequate volume of blood received in culture bottles   Culture   Final    NO GROWTH 5 DAYS Performed at [facility], [address] [place], [place] [address]    Report Status 06/20/2024 FINAL  Final  Resp panel by RT-PCR (RSV, Flu A&B, Covid) Nasal Mucosa     Status: None   Collection Time: 06/15/24 11:45 PM   Specimen: Nasal Mucosa;  Nasal Swab  Result Value Ref Range Status   SARS Coronavirus 2 by RT PCR NEGATIVE NEGATIVE Final    Comment: (NOTE) SARS-CoV-2 target nucleic acids are NOT DETECTED.  The SARS-CoV-2 RNA is generally detectable in upper respiratory specimens during the acute phase of infection. The lowest concentration of SARS-CoV-2 viral copies this assay can detect is 138 copies/mL. A negative result does not preclude SARS-Cov-2 infection and should not be used as the sole basis for treatment or other patient management decisions. A negative result may occur with  improper specimen collection/handling, submission of specimen other than nasopharyngeal swab, presence of viral mutation(s) within the areas targeted by this assay, and inadequate number of viral copies(<138 copies/mL). A negative result must be combined with clinical observations, patient history, and epidemiological information. The expected result is Negative.  Fact Sheet for Patients:  bloggercourse.com  Fact Sheet for Healthcare Providers:  seriousbroker.it  This test is no t yet approved or cleared by the [place]  FDA and  has been authorized for detection and/or diagnosis of SARS-CoV-2 by FDA under an Emergency Use Authorization (EUA). This EUA will remain  in effect (meaning this test can be used) for the duration of the COVID-19 declaration under Section 564(b)(1) of the Act, 21 U.S.C.section 360bbb-3(b)(1), unless the authorization is terminated  or revoked sooner.       Influenza A by PCR NEGATIVE NEGATIVE Final   Influenza B by PCR NEGATIVE NEGATIVE Final    Comment: (NOTE) The Xpert Xpress SARS-CoV-2/FLU/RSV plus assay is intended as an aid in the diagnosis of influenza from Nasopharyngeal swab specimens and should not be used as a sole basis for treatment. Nasal washings and aspirates are unacceptable for Xpert Xpress SARS-CoV-2/FLU/RSV testing.  Fact Sheet for  Patients: bloggercourse.com  Fact Sheet for Healthcare Providers: seriousbroker.it  This test is not yet approved or cleared by the [place]  FDA and has been authorized for detection and/or diagnosis of SARS-CoV-2 by FDA under an Emergency Use Authorization (EUA). This EUA will remain in effect (meaning this test can be used) for the duration of the COVID-19 declaration under Section 564(b)(1) of the Act, 21 U.S.C. section 360bbb-3(b)(1), unless the authorization is terminated or revoked.     Resp Syncytial Virus by PCR NEGATIVE NEGATIVE Final    Comment: (NOTE) Fact Sheet for Patients: bloggercourse.com  Fact Sheet for Healthcare Providers: seriousbroker.it  This test is not yet approved or cleared by the [place]  FDA and has been authorized for detection and/or diagnosis of SARS-CoV-2 by FDA under an Emergency Use Authorization (EUA). This EUA will remain in effect (meaning this test can be used) for the duration of the COVID-19 declaration under Section 564(b)(1) of the Act, 21 U.S.C. section 360bbb-3(b)(1), unless the authorization is terminated or revoked.  Performed at [facility], [address], [place], [place] [address]   MRSA Next Gen by PCR, Nasal     Status: None   Collection  Time: 06/15/24 11:45 PM   Specimen: Nasal Mucosa; Nasal Swab  Result Value Ref Range Status   MRSA by PCR Next Gen NOT DETECTED NOT DETECTED Final    Comment: (NOTE) The GeneXpert MRSA Assay (FDA approved for NASAL specimens only), is one component of a comprehensive MRSA colonization surveillance program. It is not intended to diagnose MRSA infection nor to guide or monitor treatment for MRSA infections. Test performance is not FDA approved in patients less than 73 years old. Performed at [facility], [address] [place], [place] [address]   Culture,  Respiratory w Gram Stain     Status: None   Collection Time: 06/16/24 11:45 AM   Specimen: Tracheal Aspirate; Respiratory  Result Value Ref Range Status   Specimen Description   Final    TRACHEAL ASPIRATE Performed at [facility], [address], [place], [place] [address]    Special Requests   Final    NONE Performed at [facility], [address] [place], [place] [address]    Gram Stain   Final    ABUNDANT WBC PRESENT, PREDOMINANTLY PMN RARE GRAM POSITIVE COCCI    Culture   Final    FEW MORAXELLA CATARRHALIS(BRANHAMELLA) BETA LACTAMASE POSITIVE Performed at [facility], [address] [place], [place] [address]    Report Status 06/18/2024 FINAL  Final  Respiratory (~20 pathogens) panel by PCR     Status: None   Collection Time: 06/16/24  1:30 PM   Specimen: Nasopharyngeal Swab; Respiratory  Result Value Ref Range Status   Adenovirus NOT DETECTED NOT DETECTED Final   Coronavirus 229E NOT DETECTED NOT DETECTED Final    Comment: (NOTE) The Coronavirus on the Respiratory Panel, DOES NOT test for the novel  Coronavirus (2019 nCoV)    Coronavirus HKU1 NOT DETECTED NOT DETECTED Final   Coronavirus NL63 NOT DETECTED NOT DETECTED Final   Coronavirus OC43 NOT DETECTED NOT DETECTED Final   Metapneumovirus NOT DETECTED NOT DETECTED Final   Rhinovirus / Enterovirus NOT DETECTED NOT DETECTED Final   Influenza A NOT DETECTED NOT DETECTED Final   Influenza B NOT DETECTED NOT DETECTED Final   Parainfluenza Virus 1 NOT DETECTED NOT DETECTED Final   Parainfluenza Virus 2 NOT DETECTED NOT DETECTED Final   Parainfluenza Virus 3 NOT DETECTED NOT DETECTED Final   Parainfluenza Virus 4 NOT DETECTED NOT DETECTED Final   Respiratory Syncytial Virus NOT DETECTED NOT DETECTED Final   Bordetella pertussis NOT DETECTED NOT DETECTED Final   Bordetella Parapertussis NOT DETECTED NOT DETECTED Final   Chlamydophila pneumoniae NOT DETECTED NOT DETECTED Final   Mycoplasma  pneumoniae NOT DETECTED NOT DETECTED Final    Comment: Performed at [facility], [address], [place], [place] [address]     Radiology Studies: No results found.   Scheduled Meds:  amiodarone   150 mg Intravenous Once   budesonide  (PULMICORT ) nebulizer solution  0.5 mg Nebulization BID   Chlorhexidine  Gluconate Cloth  6 each Topical Daily   feeding supplement  237 mL Oral BID BM   insulin  aspart  0-5 Units Subcutaneous QHS   insulin  aspart  0-9 Units Subcutaneous TID WC   methylPREDNISolone  (SOLU-MEDROL ) injection  20 mg Intravenous Q12H   multivitamin with minerals  1 tablet Oral Daily   pantoprazole  (PROTONIX ) IV  40 mg Intravenous Q12H   sodium chloride  flush  10-40 mL Intracatheter Q12H   Continuous Infusions:     LOS: 8 days  MDM: Patient is high  risk for one or more organ failure.  They necessitate ongoing hospitalization for continued IV therapies and subsequent lab monitoring. Total time spent interpreting labs and vitals, reviewing the medical record, coordinating care amongst consultants and care team members, directly assessing and discussing care with the patient and/or family: 55 min  Drue Harr, DO [facility]  To contact the attending physician between 7A-7P please use Epic Chat. To contact the covering physician during after hours 7P-7A, please review Amion.  06/23/2024, 7:47 AM   *This document has been created with the assistance of dictation software. Please excuse typographical errors. *

## 2024-06-23 NOTE — Progress Notes (Signed)
 [facility]  PROCEDURAL EXPEDITER PROGRESS NOTE  Patient Name: [name]  DOB:1951-02-03 Date of Admission: 06/15/2024  Date of Assessment:06/23/24   ------------------------------------------------------------------------------------------------------------------- OT recommends hospital bed  -------------------------------------------------------------------------------------------------------------------  [facility] Expediter, Ronal DELENA Bald Please contact us  directly via secure chat (search for [facility] or by calling us  at [phone] [place]).[facility]  PROCEDURAL EXPEDITER PROGRESS NOTE  Patient Name: [name]  DOB:1950-12-18 Date of Admission: 06/15/2024  Date of Assessment:06/23/24   -------------------------------------------------------------------------------------------------------------------    -------------------------------------------------------------------------------------------------------------------  [facility] Expediter, Ronal DELENA Bald Please contact us  directly via secure chat (search for [facility]) or by calling us  at [phone] [facility]).

## 2024-06-23 NOTE — TOC Progression Note (Addendum)
 Transition of Care [facility]) - Progression Note    Patient Details  Name: [name] MRN: [id number] Date of Birth: 12/11/1950  [facility]) CM/SW Contact  Dalia GORMAN Fuse, RN Phone Number: 06/23/2024, 2:25 PM  Clinical Narrative:     Patient has a bed offer from [facility] and [facility].   TOC lvmm with patient's daughter Trish and requested a callback.   TOC placed call to the patient's son Elsie with no option to lvmm.  TOC will continue to follow.                    Expected Discharge Plan and Services                                               Social Drivers of Health (SDOH) Interventions SDOH Screenings   Food Insecurity: No Food Insecurity (04/12/2022)  Housing: Low Risk  (04/12/2022)  Transportation Needs: No Transportation Needs (04/12/2022)  Utilities: Not At Risk (04/12/2022)  Alcohol Screen: Low Risk  (04/12/2022)  Depression (PHQ2-9): Low Risk  (04/12/2022)  Financial Resource Strain: Low Risk  (04/12/2022)  Physical Activity: Inactive (04/12/2022)  Social Connections: Socially Isolated (04/12/2022)  Stress: No Stress Concern Present (04/12/2022)  Tobacco Use: Low Risk  (06/15/2024)    Readmission Risk Interventions     No data to display

## 2024-06-23 NOTE — Consult Note (Signed)
 PHARMACY CONSULT NOTE - ELECTROLYTES  Pharmacy Consult for Electrolyte Monitoring and Replacement   Recent Labs: Height: 5' 3 (160 cm) Weight: (!) 136.4 kg (300 lb 11.3 oz) IBW/kg (Calculated) : 52.4 Estimated Creatinine Clearance: 73.4 mL/min (by C-G formula based on SCr of 0.94 mg/dL). Potassium (mmol/L)  Date Value  06/23/2024 3.9   Magnesium  (mg/dL)  Date Value  87/98/7974 3.2 (H)   Calcium (mg/dL)  Date Value  87/97/7974 9.3   Albumin (g/dL)  Date Value  88/69/7974 4.1  10/25/2021 4.2   Phosphorus (mg/dL)  Date Value  88/69/7974 3.4   Sodium (mmol/L)  Date Value  06/23/2024 145  10/25/2021 144    Assessment  [name] is a 73 y.o. female presenting with hematemesis. PMH significant for pulmonary hypertension, thrombocytopenia, hypertension, CAD, polyneuropathy, atrial fibrillation, cardiomyopathy. Pharmacy has been consulted to monitor and replace electrolytes.  Goal of Therapy:  Potassium 4.0 - 5.1 mmol/L Magnesium  2.0 - 2.4 mg/dL All Other Electrolytes WNL  Plan:  K 3.9, Will give KCL 40 mEq PO x 1 Check BMP with AM labs  Thank you for allowing pharmacy to be a part of this patient's care.  Lum VEAR Mania, PharmD Clinical Pharmacist 06/23/2024 8:51 AM

## 2024-06-24 DIAGNOSIS — I480 Paroxysmal atrial fibrillation: Secondary | ICD-10-CM | POA: Diagnosis not present

## 2024-06-24 DIAGNOSIS — K226 Gastro-esophageal laceration-hemorrhage syndrome: Secondary | ICD-10-CM | POA: Diagnosis not present

## 2024-06-24 LAB — BASIC METABOLIC PANEL WITH GFR
Anion gap: 7 (ref 5–15)
BUN: 32 mg/dL — ABNORMAL HIGH (ref 8–23)
CO2: 33 mmol/L — ABNORMAL HIGH (ref 22–32)
Calcium: 9 mg/dL (ref 8.9–10.3)
Chloride: 101 mmol/L (ref 98–111)
Creatinine, Ser: 0.67 mg/dL (ref 0.44–1.00)
GFR, Estimated: >60 mL/min (ref >60)
Glucose, Bld: 89 mg/dL (ref 70–99)
Potassium: 3.9 mmol/L (ref 3.5–5.1)
Sodium: 141 mmol/L (ref 135–145)

## 2024-06-24 LAB — GLUCOSE, CAPILLARY
Glucose-Capillary: 92 mg/dL (ref 70–99)
Glucose-Capillary: 142 mg/dL — ABNORMAL HIGH (ref 70–99)
Glucose-Capillary: 121 mg/dL — ABNORMAL HIGH (ref 70–99)
Glucose-Capillary: 111 mg/dL — ABNORMAL HIGH (ref 70–99)

## 2024-06-24 MED ORDER — ADULT MULTIVITAMIN W/MINERALS CH: 1.0000 | ORAL_TABLET | Freq: Every day | ORAL | Status: AC

## 2024-06-24 MED ORDER — ACETAMINOPHEN 325 MG PO TABS
650.0000 mg | ORAL_TABLET | Freq: Four times a day (QID) | ORAL | Status: DC | PRN
  Administered 2024-06-24 – 2024-06-25 (×3): 650 mg via ORAL
  Filled 2024-06-24 (×4): qty 2

## 2024-06-24 MED ORDER — PANTOPRAZOLE SODIUM 40 MG PO TBEC: 40.0000 mg | DELAYED_RELEASE_TABLET | Freq: Every day | ORAL | 0 refills | Status: AC

## 2024-06-24 MED ORDER — PREDNISONE 20 MG PO TABS: 20.0000 mg | ORAL_TABLET | Freq: Every day | ORAL | 0 refills | Status: DC

## 2024-06-24 MED ORDER — ALBUTEROL SULFATE HFA 108 (90 BASE) MCG/ACT IN AERS: 1.0000 | INHALATION_SPRAY | Freq: Four times a day (QID) | RESPIRATORY_TRACT | 0 refills | Status: AC | PRN

## 2024-06-24 NOTE — Progress Notes (Signed)
 PROGRESS NOTE  [name]  FMW:[id number] DOB: 1950-12-25 DOA: 06/15/2024 PCP: Odell Tor Edra [name], [name]  Chief Complaint  Patient presents with   Hematemesis   Hospital Course:  [name] 73 year old female who presented with cough, and hematemesis.  She was in acute hypoxic respiratory failure secondary to suspected aspiration pneumonia in the setting of upper GI bleed.  She required intubation and was admitted to the ICU.  On 11/25 she underwent EGD which revealed Mallory-Weiss tear without active bleeding.  She was successfully extubated on 11/27.  She remained on pressors until 11/29.  ICU stay further complicated by A-fib with RVR and decompensated heart failure.  She was started on amiodarone  drip.  Cardiology was consulted.  On 12/1 she was transferred to the TRH service. 12/1: Patient alert and oriented x 3.  Participated with speech therapy eval.  Started on diet 12/2: Doing well, working with PT/OT. SNF was recommended but patient declines. She has very strong family support at home and transport to follow up appointments.   Subjective: Patient denies SOB or CP. She states she is able to pivot to chair and bedside commode. Family at bedside agree they can care for her and transport to appointments.   Objective: Vitals:   06/23/24 1958 06/24/24 0406 06/24/24 0450 06/24/24 0807  BP: (!) 148/60 (!) 156/64  (!) 165/55  Pulse: 76 67  79  Resp: 20 20  18   Temp: (!) 97.5 F (36.4 C) (!) 97.5 F (36.4 C)  97.9 F (36.6 C)  TempSrc: Oral Oral    SpO2: 100% 99%  99%  Weight:   132.9 kg   Height:        Intake/Output Summary (Last 24 hours) at 06/24/2024 9187 Last data filed at 06/23/2024 1935 Gross per 24 hour  Intake --  Output 500 ml  Net -500 ml   Filed Weights   06/22/24 0500 06/23/24 0523 06/24/24 0450  Weight: 134.8 kg (!) 136.4 kg 132.9 kg    Examination: General exam: Appears calm and comfortable, NAD, appears older than stated age Respiratory  system: No work of breathing, rhonchorous upper airway sounds, cough Cardiovascular system: S1 & S2 heard, RRR.  Gastrointestinal system: Abdomen is nondistended, soft and nontender.  Neuro: Alert and oriented. No focal neurological deficits. Extremities: Symmetric, expected ROM Skin: No rashes, lesions Psychiatry: Demonstrates appropriate judgement and insight. Mood & affect appropriate for situation.   Assessment & Plan:  Principal Problem:   GIB (gastrointestinal bleeding) Active Problems:   Septic shock (HCC)   Hematemesis   Mallory-Weiss tear    Acute hypoxic respiratory failure- weaned to room air - Secondary to aspiration pneumonia and upper GI bleed - Extubated 11/26 - Continue DuoNebs - Tapering Solu-Medrol   Septic shock, resolved - Required pressors while in the ICU.  Blood pressure stable now  Aspiration pneumonia - Status post 7 days Unasyn  - Fever curve and WBC improved - Continue aspiration precautions - Initial procalcitonin 18.2  Paroxysmal A-fib with RVR - Initiated on amiodarone  drip in ICU, appears she is not on anything for maintenance now.  Currently rate controlled - History of A-fib, not on Eliquis  at home due to expense - Eliquis  not resumed during this admission due to active GI bleed  Acute on chronic diastolic heart failure Pulmonary hypertension - proBNP 6725 - Echocardiogram 06/16/2024: LVEF 60 to 65%, grade 2 diastolic dysfunction, moderately elevated pulmonary arterial systolic pressure, tricuspid regurg with right atrial pressure of 15 mmHg - Clinically euvolemic now.  As needed Lasix . - Will need close outpatient follow-up with cardiology  Acute upper GI bleed Acute blood loss anemia Mallory-Weiss tear - Mallory-Weiss tear seen on EGD 11/25. - Initial CTA GI bleed negative - Continue to monitor hemoglobin, transfuse if less than 7  AKI - Baseline creatinine 0.9, peak at 1.22 this admission - Improved to baseline now  Acute  metabolic encephalopathy, resolved - Secondary to septic shock and prolonged ICU stay - Head CT 11/24 without acute abnormality - Repeat head CT 11/29 no acute intracranial abnormality - Patient is alert and oriented x 4 today.  Anticipate she would do much better with speech therapy eval he can likely start diet.  Have reached back out to SLP for reevaluation - Frequent reorientation, Delirium precautions  Hypernatremia, resolved - Sodium 150 on 12/1.  Lasix  discontinued.  Unasyn  finished. - Resolved now.  Body mass index is 51.9 kg/m. Obesity Class III - Outpatient follow up for lifestyle modification and risk factor management  Abnormal TSH - TSH 6.380, T4 and T3 WNL - Can follow-up outpatient with PCP to repeat these labs when she is not acutely ill.  DVT prophylaxis: SCDs   Code Status: Full Code Disposition: Medically ready to DC. Now awaiting hh acceptance.   Consultants:  [facility]   Procedures:  Intubation, extubation, pressors EGD  Antimicrobials:  Anti-infectives (From admission, onward)    Start     Dose/Rate Route Frequency Ordered Stop   06/16/24 1215  doxycycline  (VIBRAMYCIN ) 100 mg in sodium chloride  0.9 % 250 mL IVPB        100 mg 125 mL/hr over 120 Minutes Intravenous 2 times daily 06/16/24 1120 06/20/24 2359   06/16/24 1100  azithromycin  (ZITHROMAX ) 500 mg in sodium chloride  0.9 % 250 mL IVPB  Status:  Discontinued        500 mg 250 mL/hr over 60 Minutes Intravenous Every 24 hours 06/16/24 1008 06/16/24 1120   06/16/24 0800  Ampicillin -Sulbactam (UNASYN ) 3 g in sodium chloride  0.9 % 100 mL IVPB        3 g 200 mL/hr over 30 Minutes Intravenous Every 6 hours 06/16/24 0056 06/22/24 2121   06/16/24 0130  vancomycin  (VANCOREADY) IVPB 2000 mg/400 mL        2,000 mg 200 mL/hr over 120 Minutes Intravenous  Once 06/16/24 0032 06/16/24 0335   06/15/24 2200  erythromycin  250 mg in sodium chloride  0.9 % 100 mL IVPB        250 mg 100 mL/hr over 60  Minutes Intravenous Once 06/15/24 2050 06/16/24 0132   06/15/24 2115  ceFEPIme  (MAXIPIME ) 2 g in sodium chloride  0.9 % 100 mL IVPB        2 g 200 mL/hr over 30 Minutes Intravenous  Once 06/15/24 2103 06/16/24 0131   06/15/24 2115  metroNIDAZOLE  (FLAGYL ) IVPB 500 mg        500 mg 100 mL/hr over 60 Minutes Intravenous  Once 06/15/24 2103 06/16/24 0226   06/15/24 2115  vancomycin  (VANCOCIN ) IVPB 1000 mg/200 mL premix  Status:  Discontinued        1,000 mg 200 mL/hr over 60 Minutes Intravenous  Once 06/15/24 2103 06/16/24 0032      Data Reviewed: I have personally reviewed following labs and imaging studies CBC: Recent Labs  Lab 06/18/24 0335 06/19/24 0250 06/20/24 0515 06/21/24 0352 06/22/24 0500  WBC 14.6* 8.6 9.0 9.2 9.2  HGB 10.1* 9.9* 10.5* 10.8* 10.7*  HCT 30.0* 30.5* 32.6* 34.6* 34.5*  MCV 92.9  93.6 96.7 99.1 97.7  PLT 104* 107* 106* 105* 110*   Basic Metabolic Panel: Recent Labs  Lab 06/18/24 0335 06/18/24 0355 06/19/24 0250 06/19/24 1407 06/19/24 2000 06/20/24 0515 06/20/24 2205 06/21/24 0352 06/22/24 0500 06/23/24 0500  NA 136  --  140  --   --  146* 145 146* 150* 145  K 3.3*  --  3.1* 3.2*   < > 3.4* 3.9 4.0 3.6 3.9  CL 99  --  99  --   --  106 104 103 105 102  CO2 23  --  27  --   --  29 29 30  33* 33*  GLUCOSE 130*  --  123*  --   --  128* 122* 130* 113* 94  BUN 32*  --  34*  --   --  35* 41* 42* 50* 46*  CREATININE 1.12*  --  1.16*  --   --  1.04* 1.04* 1.08* 1.12* 0.94  CALCIUM 8.0*  --  8.4*  --   --  8.5* 9.0 9.1 9.5 9.3  MG  --  2.1  --  2.5*  --  2.7*  --  3.0* 3.2*  --   PHOS 3.3  --  2.9  --   --  2.4*  --  3.4  --   --    < > = values in this interval not displayed.   GFR: Estimated Creatinine Clearance: 72.3 mL/min (by C-G formula based on SCr of 0.94 mg/dL). Liver Function Tests: Recent Labs  Lab 06/18/24 0335 06/19/24 0250 06/20/24 0515 06/21/24 0352  ALBUMIN 3.4* 3.7 3.8 4.1   CBG: Recent Labs  Lab 06/23/24 1201 06/23/24 1715  06/23/24 1744 06/23/24 2121 06/24/24 0807  GLUCAP 74 69* 96 87 92    Recent Results (from the past 240 hours)  Blood Culture (routine x 2)     Status: Abnormal   Collection Time: 06/15/24 10:03 PM   Specimen: BLOOD  Result Value Ref Range Status   Specimen Description   Final    BLOOD LEFT ANTECUBITAL Performed at [facility], [address] [place], [place] [address]    Special Requests   Final    BOTTLES DRAWN AEROBIC AND ANAEROBIC Blood Culture adequate volume Performed at [facility], [address] [place], [place] [address]    Culture  Setup Time   Final    GRAM POSITIVE RODS AEROBIC BOTTLE ONLY CRITICAL RESULT CALLED TO, READ BACK BY AND VERIFIED WITH:  LISA KLUTTZ AT 2339 06/18/24 JG Performed at [facility], [address] [place], [place] [address]    Culture (A)  Final    DIPHTHEROIDS(CORYNEBACTERIUM SPECIES) CRITICAL RESULT CALLED TO, READ BACK BY AND VERIFIED WITH: PHARMD MERRILL, K. 1201 AT 1128, ADC Standardized susceptibility testing for this organism is not available. Performed at [facility], [address], [place], [place] [address]    Report Status 06/23/2024 FINAL  Final  Blood Culture (routine x 2)     Status: None   Collection Time: 06/15/24 10:03 PM   Specimen: BLOOD  Result Value Ref Range Status   Specimen Description BLOOD RIGHT ANTECUBITAL  Final   Special Requests   Final    BOTTLES DRAWN AEROBIC AND ANAEROBIC Blood Culture results may not be optimal due to an inadequate volume of blood received in culture bottles   Culture   Final    NO GROWTH 5 DAYS Performed at [facility], [address]  [place], [place] [address]    Report Status 06/20/2024 FINAL  Final  Resp panel by RT-PCR (RSV, Flu A&B, Covid) Nasal Mucosa     Status: None   Collection Time: 06/15/24 11:45 PM   Specimen: Nasal Mucosa; Nasal Swab  Result Value Ref Range Status   SARS Coronavirus 2 by RT PCR NEGATIVE NEGATIVE  Final    Comment: (NOTE) SARS-CoV-2 target nucleic acids are NOT DETECTED.  The SARS-CoV-2 RNA is generally detectable in upper respiratory specimens during the acute phase of infection. The lowest concentration of SARS-CoV-2 viral copies this assay can detect is 138 copies/mL. A negative result does not preclude SARS-Cov-2 infection and should not be used as the sole basis for treatment or other patient management decisions. A negative result may occur with  improper specimen collection/handling, submission of specimen other than nasopharyngeal swab, presence of viral mutation(s) within the areas targeted by this assay, and inadequate number of viral copies(<138 copies/mL). A negative result must be combined with clinical observations, patient history, and epidemiological information. The expected result is Negative.  Fact Sheet for Patients:  bloggercourse.com  Fact Sheet for Healthcare Providers:  seriousbroker.it  This test is no t yet approved or cleared by the [place]  FDA and  has been authorized for detection and/or diagnosis of SARS-CoV-2 by FDA under an Emergency Use Authorization (EUA). This EUA will remain  in effect (meaning this test can be used) for the duration of the COVID-19 declaration under Section 564(b)(1) of the Act, 21 U.S.C.section 360bbb-3(b)(1), unless the authorization is terminated  or revoked sooner.       Influenza A by PCR NEGATIVE NEGATIVE Final   Influenza B by PCR NEGATIVE NEGATIVE Final    Comment: (NOTE) The Xpert Xpress SARS-CoV-2/FLU/RSV plus assay is intended as an aid in the diagnosis of influenza from Nasopharyngeal swab specimens and should not be used as a sole basis for treatment. Nasal washings and aspirates are unacceptable for Xpert Xpress SARS-CoV-2/FLU/RSV testing.  Fact Sheet for Patients: bloggercourse.com  Fact Sheet for Healthcare  Providers: seriousbroker.it  This test is not yet approved or cleared by the [place]  FDA and has been authorized for detection and/or diagnosis of SARS-CoV-2 by FDA under an Emergency Use Authorization (EUA). This EUA will remain in effect (meaning this test can be used) for the duration of the COVID-19 declaration under Section 564(b)(1) of the Act, 21 U.S.C. section 360bbb-3(b)(1), unless the authorization is terminated or revoked.     Resp Syncytial Virus by PCR NEGATIVE NEGATIVE Final    Comment: (NOTE) Fact Sheet for Patients: bloggercourse.com  Fact Sheet for Healthcare Providers: seriousbroker.it  This test is not yet approved or cleared by the [place]  FDA and has been authorized for detection and/or diagnosis of SARS-CoV-2 by FDA under an Emergency Use Authorization (EUA). This EUA will remain in effect (meaning this test can be used) for the duration of the COVID-19 declaration under Section 564(b)(1) of the Act, 21 U.S.C. section 360bbb-3(b)(1), unless the authorization is terminated or revoked.  Performed at [facility], [address] [place], [place] [address]   MRSA Next Gen by PCR, Nasal     Status: None   Collection Time: 06/15/24 11:45 PM   Specimen: Nasal Mucosa; Nasal Swab  Result Value Ref Range Status   MRSA by PCR Next Gen NOT DETECTED NOT DETECTED Final    Comment: (NOTE) The GeneXpert MRSA Assay (FDA approved for NASAL specimens only), is one component of a comprehensive MRSA colonization surveillance  program. It is not intended to diagnose MRSA infection nor to guide or monitor treatment for MRSA infections. Test performance is not FDA approved in patients less than 39 years old. Performed at [facility], [address] [place], [place] [address]   Culture, Respiratory w Gram Stain     Status: None   Collection Time: 06/16/24 11:45 AM    Specimen: Tracheal Aspirate; Respiratory  Result Value Ref Range Status   Specimen Description   Final    TRACHEAL ASPIRATE Performed at [facility], [address], [place], [place] [address]    Special Requests   Final    NONE Performed at [facility], [address] [place], [place] [address]    Gram Stain   Final    ABUNDANT WBC PRESENT, PREDOMINANTLY PMN RARE GRAM POSITIVE COCCI    Culture   Final    FEW MORAXELLA CATARRHALIS(BRANHAMELLA) BETA LACTAMASE POSITIVE Performed at [facility], [address] [place], [place] [address]    Report Status 06/18/2024 FINAL  Final  Respiratory (~20 pathogens) panel by PCR     Status: None   Collection Time: 06/16/24  1:30 PM   Specimen: Nasopharyngeal Swab; Respiratory  Result Value Ref Range Status   Adenovirus NOT DETECTED NOT DETECTED Final   Coronavirus 229E NOT DETECTED NOT DETECTED Final    Comment: (NOTE) The Coronavirus on the Respiratory Panel, DOES NOT test for the novel  Coronavirus (2019 nCoV)    Coronavirus HKU1 NOT DETECTED NOT DETECTED Final   Coronavirus NL63 NOT DETECTED NOT DETECTED Final   Coronavirus OC43 NOT DETECTED NOT DETECTED Final   Metapneumovirus NOT DETECTED NOT DETECTED Final   Rhinovirus / Enterovirus NOT DETECTED NOT DETECTED Final   Influenza A NOT DETECTED NOT DETECTED Final   Influenza B NOT DETECTED NOT DETECTED Final   Parainfluenza Virus 1 NOT DETECTED NOT DETECTED Final   Parainfluenza Virus 2 NOT DETECTED NOT DETECTED Final   Parainfluenza Virus 3 NOT DETECTED NOT DETECTED Final   Parainfluenza Virus 4 NOT DETECTED NOT DETECTED Final   Respiratory Syncytial Virus NOT DETECTED NOT DETECTED Final   Bordetella pertussis NOT DETECTED NOT DETECTED Final   Bordetella Parapertussis NOT DETECTED NOT DETECTED Final   Chlamydophila pneumoniae NOT DETECTED NOT DETECTED Final   Mycoplasma pneumoniae NOT DETECTED NOT DETECTED Final    Comment: Performed at [facility], [address], [place], [place] [address]     Radiology Studies: No results found.   Scheduled Meds:  amiodarone   150 mg Intravenous Once   budesonide  (PULMICORT ) nebulizer solution  0.5 mg Nebulization BID   Chlorhexidine  Gluconate Cloth  6 each Topical Daily   feeding supplement  237 mL Oral BID BM   insulin  aspart  0-5 Units Subcutaneous QHS   insulin  aspart  0-9 Units Subcutaneous TID WC   methylPREDNISolone  (SOLU-MEDROL ) injection  20 mg Intravenous Daily   multivitamin with minerals  1 tablet Oral Daily   pantoprazole  (PROTONIX ) IV  40 mg Intravenous Q12H   sodium chloride  flush  10-40 mL Intracatheter Q12H   Continuous Infusions:     LOS: 9 days  MDM: Patient is high risk for one or more organ failure.  They necessitate ongoing hospitalization for continued IV therapies and subsequent lab monitoring. Total time spent interpreting labs and vitals, reviewing the medical record, coordinating care amongst consultants and care team members, directly assessing and discussing care with the patient and/or family: 55 min  Sue Marinos  LITTIE Piety, DO [facility]  To contact the attending physician between 7A-7P please use Epic Chat. To contact the covering physician during after hours 7P-7A, please review Amion.  06/24/2024, 8:12 AM

## 2024-06-24 NOTE — TOC Progression Note (Addendum)
 Transition of Care [facility]) - Progression Note    Patient Details  Name: [name] MRN: [id number] Date of Birth: [birth year]  Transition of [facility]) CM/SW Contact  Dalia GORMAN Fuse, RN Phone Number: 06/24/2024, 10:45 AM  Clinical Narrative:     TOC spoke with the patient's daughter Sari and advised the patient has a bed offer at [facility] and [facility]. Per Sari their preference is to take care of the patient at home. Sari and the patient's granddaughter live 2 houses away from her and will provide support. The patient's son and grandson live with her and will help with her care as well.    TOC is awaiting a walking O2 sat for Home O2.                  Expected Discharge Plan and Services                                               Social Drivers of Health (SDOH) Interventions SDOH Screenings   Food Insecurity: No Food Insecurity (04/12/2022)  Housing: Low Risk  (04/12/2022)  Transportation Needs: No Transportation Needs (04/12/2022)  Utilities: Not At Risk (04/12/2022)  Alcohol Screen: Low Risk  (04/12/2022)  Depression (PHQ2-9): Low Risk  (04/12/2022)  Financial Resource Strain: Low Risk  (04/12/2022)  Physical Activity: Inactive (04/12/2022)  Social Connections: Socially Isolated (04/12/2022)  Stress: No Stress Concern Present (04/12/2022)  Tobacco Use: Low Risk  (06/15/2024)    Readmission Risk Interventions     No data to display

## 2024-06-24 NOTE — Progress Notes (Addendum)
 Nutrition Follow-up  DOCUMENTATION CODES:   Morbid obesity  INTERVENTION:   -Continue regular diet -Continue MVI with minerals daily -Continue Ensure Plus High Protein po BID, each supplement provides 350 kcal and 20 grams of protein  -Continue Magic cup TID with meals, each supplement provides 290 kcal and 9 grams of protein  -Continue feeding assistance with meals   NUTRITION DIAGNOSIS:   Inadequate oral intake related to inability to eat (pt sedated and ventilated) as evidenced by NPO status.  Progressing; advanced to PO diet on 06/22/24   GOAL:   Patient will meet greater than or equal to 90% of their needs  Progressing   MONITOR:   PO intake, Supplement acceptance  REASON FOR ASSESSMENT:   Ventilator    ASSESSMENT:   73 y/o female with h/o morbid obesity , chronic pain, Afib and CHF who is admitted with PNA, septic shock and GIB secondary to [employer] tear.  11/24- intubated 11/25- s/p EGD- revealed mallory weiss tear 11/27- extubated 12/1- s/p BSE- dysphagia 1 diet with thin liquids 12/3- advanced to regular diet  Reviewed I/O's: -500 ml x 24 hours and +2.7 L since admission  UOP: 500 ml x 24 hours   Patient sitting up in recliner chair at time of visit. Patient more alert and interactive in comparison to prior visit and able to make needs known. Patinet reports feeling better today. Breakfast tray in front of patient, which she consumed about 25%. Patient states that the puree food is nasty. RD discussed SLP recommendation for puree diet due to patient concern about consuming solid foods without teeth. Patient alerted RD that she had her dentures with her; RD retrieved patient dentures from counter and offered to assist her in placing them, however, she politely declined. RD reached out to SLP to see if diet could be advanced secondary to patient now having access to her dentures. SLP advanced diet to regular; RD informed patient and she expressed  appreciation for this.   Patient weight has ranged from 132.9-140.6 kg over the past 7 days. Patient with history of edema and CHF, which is likely continuing to weight fluctuations. She remains with deep pitting edema on exam.   Discussed importance of good meal and supplement intake to promote healing. Patient states that she is drinking Ensure supplements and likes them. RD discussed importance of continuing supplements to help optimize intake.   Medications reviewed and include solu-medrol  and protonix .   Labs reviewed: CBGS: 26-116 (inpatient orders for glycemic control are 0-9 units insulin  apart TID with meals). Noted patient with 2 hypoglycemia events in the past 24 hours. Suspect blood sugar levels will improve with improved oral intake from diet advancement.   Diet Order:   Diet Order             Diet regular Room service appropriate? Yes with Assist; Fluid consistency: Thin  Diet effective now                   EDUCATION NEEDS:   Education needs have been addressed  Skin:  Skin Assessment: Reviewed RN Assessment  Last BM:  06/23/24  Height:   Ht Readings from Last 1 Encounters:  06/21/24 5' 3 (1.6 m)    Weight:   Wt Readings from Last 1 Encounters:  06/24/24 132.9 kg    Ideal Body Weight:  52.2 kg  BMI:  Body mass index is 51.9 kg/m.  Estimated Nutritional Needs:   Kcal:  1800-2000  Protein:  105-120 grams  Fluid:  1.8-2.0 L    Margery ORN, RD, [name], CDCES Registered Dietitian III Certified Diabetes Care and Education Specialist If unable to reach this RD, please use RD Inpatient group chat on secure chat between hours of 8am-4 pm daily

## 2024-06-24 NOTE — Discharge Summary (Incomplete)
 Physician Discharge Summary  Patient: [name] FMW:[id number] DOB: [birth year]   Code Status: Full Code Admit date: 06/15/2024 Discharge date: 06/24/2024 Disposition: Home health, PT, OT, SLP, and nurse aid PCP: Odell Chard, Edra GRADE, MD  Recommendations for Outpatient Follow-up:  Follow up with PCP within 1-2 weeks Regarding general hospital follow up and preventative care Recommend *** ***  Discharge Diagnoses:  Principal Problem:   GIB (gastrointestinal bleeding) Active Problems:   Septic shock (HCC)   Hematemesis   Mallory-Weiss tear  Brief Hospital Course Summary: [name] 73 year old female who presented with cough, and hematemesis.  She was in acute hypoxic respiratory failure secondary to suspected aspiration pneumonia in the setting of upper GI bleed.  She required intubation and was admitted to the ICU.  On 11/25 she underwent EGD which revealed Mallory-Weiss tear without active bleeding.  She was successfully extubated on 11/27.  She remained on pressors until 11/29.  ICU stay further complicated by A-fib with RVR and decompensated heart failure.  She was started on amiodarone  drip.  Cardiology was consulted.  On 12/1 she was transferred to the [facility] service. 12/1: Patient alert and oriented x 3.  Participated with speech therapy eval.  Started on diet 12/2: Doing well, working with PT/OT. Pending SNF  All other chronic conditions were treated with home medications.    Discharge Condition: Stable, improved Recommended discharge diet: Regular healthy diet  Consultations: [facility] Cardiology  GI  Procedures/Studies: Intubated  EGD  Allergies as of 06/24/2024   No Known Allergies      Medication List     STOP taking these medications    apixaban  5 MG Tabs tablet Commonly known as: ELIQUIS    gabapentin  600 MG tablet Commonly known as: NEURONTIN    metoprolol  succinate 100 MG 24 hr tablet Commonly known as: TOPROL -XL   torsemide  20 MG  tablet Commonly known as: DEMADEX        TAKE these medications    albuterol  108 (90 Base) MCG/ACT inhaler Commonly known as: VENTOLIN  HFA Inhale 1-2 puffs into the lungs every 6 (six) hours as needed for wheezing or shortness of breath.   multivitamin with minerals Tabs tablet Take 1 tablet by mouth daily. Start taking on: June 25, 2024   ondansetron  4 MG tablet Commonly known as: Zofran  Take 1 tablet (4 mg total) by mouth every 8 (eight) hours as needed for nausea or vomiting.   optichamber diamond Misc by Does not apply route.   pantoprazole  40 MG tablet Commonly known as: Protonix  Take 1 tablet (40 mg total) by mouth daily.   predniSONE  20 MG tablet Commonly known as: DELTASONE  Take 1 tablet (20 mg total) by mouth daily for 2 days.   pregabalin 50 MG capsule Commonly known as: LYRICA Take 1 capsule by mouth 2 (two) times daily.   TYLENOL  8 HOUR PO Take by mouth as needed.        Follow-up Information     Custovic, Annalee, DO. Go in 1 week(s).   Specialty: Cardiology Contact information: [address] [place] [address] [phone]         Odell Chard, Edra GRADE, MD. Schedule an appointment as soon as possible for a visit in 1 week(s).   Specialty: Family Medicine Contact information: [address] [place] [address] [phone]                 Subjective   Pt reports ***  All questions and concerns were addressed at time of discharge.  Objective  Blood pressure (!) 178/93, pulse 84, temperature 97.9 F (36.6 C), temperature source Oral, resp. rate 20, height 5' 3 (1.6 m), weight 132.9 kg, SpO2 97%.   General: Pt is alert, awake, not in acute distress Cardiovascular: RRR, S1/S2 +, no rubs, no gallops Respiratory: CTA bilaterally, no wheezing, no rhonchi Abdominal: Soft, NT, ND, bowel sounds + Extremities: no edema, no cyanosis  The results of significant diagnostics from this hospitalization (including imaging,  microbiology, ancillary and laboratory) are listed below for reference.   Imaging studies: CT HEAD WO CONTRAST ( ) Result Date: 06/20/2024 EXAM: CT HEAD WITHOUT 06/20/2024 11:14:15 AM TECHNIQUE: CT of the head was performed without the administration of intravenous contrast. Automated exposure control, iterative reconstruction, and/or weight based adjustment of the mA/kV was utilized to reduce the radiation dose to as low as reasonably achievable. COMPARISON: 06/15/2024 CLINICAL HISTORY: Neuro deficit, acute, stroke suspected FINDINGS: BRAIN AND VENTRICLES: No acute intracranial hemorrhage. No mass effect or midline shift. No extra-axial fluid collection. No evidence of acute infarct. No hydrocephalus. Calcified atherosclerotic plaque within cavernous/supraclinoid internal carotid arteries. ORBITS: No acute abnormality. SINUSES AND MASTOIDS: No acute abnormality. SOFT TISSUES AND SKULL: No acute skull fracture. No acute soft tissue abnormality. IMPRESSION: 1. No acute intracranial abnormality. 2. Calcified atherosclerotic plaque within cavernous/supraclinoid internal carotid arteries. Electronically signed by: Evalene Coho MD 06/20/2024 11:52 AM EST RP Workstation: [id number]   DG Chest Port 1 View Result Date: 06/17/2024 CLINICAL DATA:  5626 Acute respiratory failure (HCC) 5626 EXAM: PORTABLE CHEST - 1 VIEW COMPARISON:  June 16, 2024 FINDINGS: Endotracheal tube is similarly positioned terminating in the mid trachea above the carina. Left IJ approach central venous catheter terminates in the lower SVC. Improved aeration of the lungs with persistent left mid and lower lung zone airspace opacities. Likely trace pleural effusion on the left. The cardiac silhouette appears enlarged, unchanged. Aortic atherosclerosis. Esophagogastric tube courses below the diaphragm with the distal tip not included in the field of view. No pneumothorax. IMPRESSION: 1. Improved aeration of both lungs with persistent  airspace opacities in the left mid and lower lung zones. Likely trace left pleural effusion. 2. Similarly positioned support tubes and lines, as delineated above. Electronically Signed   By: Rogelia Myers M.D.   On: 06/17/2024 11:20   DG Chest Port 1 View Result Date: 06/16/2024 CLINICAL DATA:  Orogastric tube placement. EXAM: PORTABLE CHEST 1 VIEW COMPARISON:  Radiographs 06/16/2024 and 06/15/2024.  CT 06/15/2024. FINDINGS: 1500 hours. Two views submitted. The carina is not well visualized. Endotracheal tube appears unchanged, proximally 1.1 cm above the carina. Enteric tube projects below the diaphragm with tip overlying the mid stomach. A left IJ central venous catheter projects over the lower SVC. Lower lung volumes with increasing diffuse bilateral airspace opacities, most consistent with worsening pulmonary edema. Possible small bilateral pleural effusions. No evidence of pneumothorax. Grossly stable heart size and mediastinal contours. There are aortic calcifications. IMPRESSION: 1. Enteric tube tip overlies the mid stomach. 2. Lower lung volumes with increasing diffuse bilateral airspace opacities, most consistent with worsening pulmonary edema. Electronically Signed   By: Elsie Perone M.D.   On: 06/16/2024 16:09   ECHOCARDIOGRAM COMPLETE Result Date: 06/16/2024    ECHOCARDIOGRAM REPORT   Patient Name:   [name] Date of Exam: 06/16/2024 Medical Rec #:  [id number]       Height:       63.0 in Accession #:    [id number]      Weight:  305.1 lb Date of Birth:  [birth year]       BSA:          2.314 m Patient Age:    73 years        BP:           107/64 mmHg Patient Gender: F               HR:           133 bpm. Exam Location:  [facility] Procedure: 2D Echo, Cardiac Doppler and Color Doppler (Both Spectral and Color            Flow Doppler were utilized during procedure). Indications:     Cardiomyopathy-Unspecified I42.9  History:         Patient has prior history of Echocardiogram examinations,  most                  recent 10/23/2021. Cardiomyopathy; Arrythmias:Atrial                  Fibrillation.  Sonographer:     Ashley McNeely-Sloane Referring Phys:  [id number] BRITTON L RUST-CHESTER Diagnosing Phys: Annalee Custovic IMPRESSIONS  1. Left ventricular ejection fraction, by estimation, is 60 to 65%. The left ventricle has normal function. The left ventricle has no regional wall motion abnormalities. Left ventricular diastolic parameters are consistent with Grade II diastolic dysfunction (pseudonormalization).  2. Right ventricular systolic function is normal. The right ventricular size is normal. There is moderately elevated pulmonary artery systolic pressure. The estimated right ventricular systolic pressure is 55.4 mmHg.  3. Left atrial size was moderately dilated.  4. Right atrial size was mildly dilated.  5. The mitral valve is normal in structure. Mild mitral valve regurgitation. No evidence of mitral stenosis.  6. Tricuspid valve regurgitation is moderate.  7. The aortic valve is normal in structure. Aortic valve regurgitation is moderate. No aortic stenosis is present.  8. The inferior vena cava is normal in size with greater than 50% respiratory variability, suggesting right atrial pressure of 3 mmHg. FINDINGS  Left Ventricle: Left ventricular ejection fraction, by estimation, is 60 to 65%. The left ventricle has normal function. The left ventricle has no regional wall motion abnormalities. The left ventricular internal cavity size was normal in size. There is  no left ventricular hypertrophy. Left ventricular diastolic parameters are consistent with Grade II diastolic dysfunction (pseudonormalization). Right Ventricle: The right ventricular size is normal. No increase in right ventricular wall thickness. Right ventricular systolic function is normal. There is moderately elevated pulmonary artery systolic pressure. The tricuspid regurgitant velocity is 3.18 m/s, and with an assumed right atrial pressure  of 15 mmHg, the estimated right ventricular systolic pressure is 55.4 mmHg. Left Atrium: Left atrial size was moderately dilated. Right Atrium: Right atrial size was mildly dilated. Pericardium: There is no evidence of pericardial effusion. Mitral Valve: The mitral valve is normal in structure. Mild mitral valve regurgitation. No evidence of mitral valve stenosis. MV peak gradient, 11.6 mmHg. The mean mitral valve gradient is 4.0 mmHg. Tricuspid Valve: The tricuspid valve is normal in structure. Tricuspid valve regurgitation is moderate. Aortic Valve: The aortic valve is normal in structure. Aortic valve regurgitation is moderate. Aortic regurgitation PHT measures 202 msec. No aortic stenosis is present. Aortic valve mean gradient measures 4.0 mmHg. Aortic valve peak gradient measures 6.7 mmHg. Aortic valve area, by VTI measures 2.82 cm. Pulmonic Valve: The pulmonic valve was normal in structure. Pulmonic valve regurgitation is not visualized. Aorta:  The aortic root is normal in size and structure. Venous: The inferior vena cava is normal in size with greater than 50% respiratory variability, suggesting right atrial pressure of 3 mmHg. IAS/Shunts: No atrial level shunt detected by color flow Doppler.  LEFT VENTRICLE PLAX 2D LVIDd:         4.50 cm     Diastology LVIDs:         3.20 cm     LV e' medial:    7.62 cm/s LV PW:         1.20 cm     LV E/e' medial:  14.4 LV IVS:        1.30 cm     LV e' lateral:   8.27 cm/s LVOT diam:     2.10 cm     LV E/e' lateral: 13.3 LV SV:         47 LV SV Index:   20 LVOT Area:     3.46 cm LV IVRT:       56 msec  LV Volumes (MOD) LV vol d, MOD A2C: 36.7 ml LV vol d, MOD A4C: 69.6 ml LV vol s, MOD A2C: 19.6 ml LV vol s, MOD A4C: 22.5 ml LV SV MOD A2C:     17.1 ml LV SV MOD A4C:     69.6 ml LV SV MOD BP:      29.3 ml RIGHT VENTRICLE             IVC RV Basal diam:  4.20 cm     IVC diam: 2.70 cm RV Mid diam:    4.20 cm RV S prime:     12.50 cm/s TAPSE (M-mode): 1.4 cm LEFT ATRIUM              Index        RIGHT ATRIUM           Index LA diam:        4.70 cm 2.03 cm/m   RA Area:     24.00 cm LA Vol (A2C):   79.1 ml 34.19 ml/m  RA Volume:   71.00 ml  30.68 ml/m LA Vol (A4C):   91.7 ml 39.63 ml/m LA Biplane Vol: 90.4 ml 39.07 ml/m  AORTIC VALVE                    PULMONIC VALVE AV Area (Vmax):    2.74 cm     PV Vmax:        0.83 m/s AV Area (Vmean):   2.76 cm     PV Vmean:       58.700 cm/s AV Area (VTI):     2.82 cm     PV VTI:         0.121 m AV Vmax:           129.00 cm/s  PV Peak grad:   2.8 mmHg AV Vmean:          89.400 cm/s  PV Mean grad:   2.0 mmHg AV VTI:            0.166 m      RVOT Peak grad: 2 mmHg AV Peak Grad:      6.7 mmHg AV Mean Grad:      4.0 mmHg LVOT Vmax:         102.00 cm/s LVOT Vmean:        71.300 cm/s LVOT VTI:  0.135 m LVOT/AV VTI ratio: 0.81 AI PHT:            202 msec  AORTA Ao Root diam: 3.40 cm Ao Asc diam:  3.50 cm MITRAL VALVE                TRICUSPID VALVE MV Area (PHT): 4.71 cm     TR Peak grad:   40.4 mmHg MV Area VTI:   1.80 cm     TR Mean grad:   21.0 mmHg MV Peak grad:  11.6 mmHg    TR Vmax:        318.00 cm/s MV Mean grad:  4.0 mmHg     TR Vmean:       225.0 cm/s MV Vmax:       1.70 m/s MV Vmean:      90.2 cm/s    SHUNTS MV Decel Time: 161 msec     Systemic VTI:  0.14 m MV E velocity: 110.00 cm/s  Systemic Diam: 2.10 cm                             Pulmonic VTI:  0.095 m Annalee Custovic Electronically signed by Annalee Casa Signature Date/Time: 06/16/2024/3:15:43 PM    Final    DG Chest 1 View Result Date: 06/16/2024 EXAM: 1 VIEW(S) XRAY OF THE CHEST 06/16/2024 09:48:00 AM COMPARISON: 06/15/2024 CLINICAL HISTORY: Encounter for central line placement [id number] FINDINGS: LINES, TUBES AND DEVICES: Endotracheal tube in place with tip 1.3 cm above the carina. Left internal jugular central venous catheter terminates in the region of the distal superior vena cava. Enteric tube extends into the stomach. LUNGS AND PLEURA: Mild pulmonary edema. Stable  bilateral airspace opacities. Small left pleural effusion. Low lung volumes. No pneumothorax. HEART AND MEDIASTINUM: Persistent cardiomegaly. Aortic atherosclerosis. BONES AND SOFT TISSUES: No acute osseous abnormality. IMPRESSION: 1. Mild pulmonary edema and stable bilateral airspace opacities. 2. Small left pleural effusion. 3. Persistent cardiomegaly and aortic atherosclerosis. Electronically signed by: Evalene Coho MD 06/16/2024 10:13 AM EST RP Workstation: [id number]   CT CHEST WO CONTRAST Result Date: 06/15/2024 EXAM: CT CHEST WITHOUT CONTRAST 06/15/2024 11:23:23 PM TECHNIQUE: CT of the chest was performed without the administration of intravenous contrast. Multiplanar reformatted images are provided for review. Automated exposure control, iterative reconstruction, and/or weight based adjustment of the mA/kV was utilized to reduce the radiation dose to as low as reasonably achievable. COMPARISON: 05/27/2021. CLINICAL HISTORY: Aspiration. FINDINGS: MEDIASTINUM: There was a ball in the middle of the esophagus. Cardiomegaly. Coronary artery and aortic atherosclerosis. Endotracheal tube tip is in the lower trachea above the carina. Pericardium is unremarkable. LYMPH NODES: No mediastinal, hilar or axillary lymphadenopathy. LUNGS AND PLEURA: Extensive bilateral airspace disease, most pronounced throughout the left lung and in the right lower lobe. Findings concerning for pneumonia. Trace bilateral pleural effusions. No pneumothorax. SOFT TISSUES/BONES: No acute abnormality of the bones or soft tissues. UPPER ABDOMEN: Limited images of the upper abdomen demonstrates no acute abnormality. IMPRESSION: 1. Extensive bilateral airspace disease, most pronounced throughout the left lung and in the right lower lobe, concerning for pneumonia. 2. Trace bilateral pleural effusions. 3. Cardiomegaly, coronary artery disease . 4. Aortic atherosclerosis. Electronically signed by: Franky Crease MD 06/15/2024 11:44 PM EST RP  Workstation: [id number]   CT ANGIO GI BLEED Result Date: 06/15/2024 EXAM: CTA ABDOMEN AND PELVIS WITH CONTRAST 06/15/2024 11:23:23 PM TECHNIQUE: CTA images of the abdomen and pelvis with intravenous contrast. 125 mL of  iohexol  (OMNIPAQUE ) 350 MG/ML injection was administered. Three-dimensional MIP/volume rendered formations were performed. Automated exposure control, iterative reconstruction, and/or weight based adjustment of the mA/kV was utilized to reduce the radiation dose to as low as reasonably achievable. COMPARISON: Comparison is made to a study from 2012. CLINICAL HISTORY: LUQ abdominal pain; hematemesis, abd pain. FINDINGS: VASCULATURE: GI BLEED: No active extravasation of contrast within the GI tract. AORTA: Aortic atherosclerosis. No acute finding. No abdominal aortic aneurysm. No dissection. CELIAC TRUNK: No acute finding. No occlusion or significant stenosis. SUPERIOR MESENTERIC ARTERY: No acute finding. No occlusion or significant stenosis. INFERIOR MESENTERIC ARTERY: No acute finding. No occlusion or significant stenosis. RENAL ARTERIES: No acute finding. No occlusion or significant stenosis. ILIAC ARTERIES: No acute finding. No occlusion or significant stenosis. ABDOMEN/PELVIS: LOWER CHEST: See chest CT report today. LIVER: The liver is unremarkable. GALLBLADDER AND BILE DUCTS: Gallbladder is unremarkable. No biliary ductal dilatation. SPLEEN: The spleen is unremarkable. PANCREAS: The pancreas is unremarkable. ADRENAL GLANDS: Bilateral adrenal glands demonstrate no acute abnormality. KIDNEYS, URETERS AND BLADDER: No stones in the kidneys or ureters. No hydronephrosis. No perinephric or periureteral stranding. Foley catheter in the bladder, which is decompressed. GI AND BOWEL: NG tube in the stomach. Stomach and duodenal sweep demonstrate no acute abnormality. Normal appendix. There is no bowel obstruction. No abnormal bowel wall thickening or distension. REPRODUCTIVE: Reproductive organs are  unremarkable. PERITONEUM AND RETROPERITONEUM: 6.5 x 6.2 cm presacral perirectal fluid collection noted. This was present in 2012 when this measured 6.3 x 4.1 cm. The very slow growth over 13 years suggests a benign process. Moderate-sized umbilical hernia containing fat and a small amount of fluid. This has enlarged since 2012. No ascites or free air. LYMPH NODES: No lymphadenopathy. BONES AND SOFT TISSUES: No acute abnormality of the bones. No acute soft tissue abnormality. IMPRESSION: 1. No active GI bleeding. 2. No occlusion or hemodynamically significant stenosis of the abdominal or pelvic arterial system. No abdominal aortic aneurysm or dissection. 3. Presacral perirectal 6.5 x 6.2 cm fluid collection, minimally changed since 2012 and likely benign. 4. Moderate-sized umbilical hernia containing fat and a small amount of fluid, increased in size since 2012. Electronically signed by: Franky Crease MD 06/15/2024 11:42 PM EST RP Workstation: [id number]   CT HEAD WO CONTRAST ( ) Result Date: 06/15/2024 EXAM: CT HEAD WITHOUT CONTRAST 06/15/2024 11:23:23 PM TECHNIQUE: CT of the head was performed without the administration of intravenous contrast. Automated exposure control, iterative reconstruction, and/or weight based adjustment of the mA/kV was utilized to reduce the radiation dose to as low as reasonably achievable. COMPARISON: 09/01/2015. CLINICAL HISTORY: Mental status change, unknown cause. FINDINGS: BRAIN AND VENTRICLES: No acute hemorrhage. No evidence of acute infarct. No hydrocephalus. No extra-axial collection. No mass effect or midline shift. ORBITS: Soft tissue swelling along the lateral aspect of the left orbit. SINUSES: Mucosal thickening in ethmoid air cells. SOFT TISSUES AND SKULL: Partially visualized enteric tube. No acute soft tissue abnormality. No skull fracture. IMPRESSION: 1. No acute intracranial abnormality. 2. Soft tissue swelling along the lateral aspect of the left orbit. 3. Mucosal  thickening in ethmoid air cells. Electronically signed by: Donnice Mania MD 06/15/2024 11:33 PM EST RP Workstation: [id number]   DG Abd Portable 1 View Result Date: 06/15/2024 EXAM: 1 VIEW XRAY OF THE ABDOMEN 06/15/2024 09:23:00 PM COMPARISON: None available. CLINICAL HISTORY: OB tube insertion. FINDINGS: LIMITATIONS: Limited field of view for tube placement verification purposes. LINES, TUBES AND DEVICES: An enteric tube has been placed with the tip projecting over  the upper mid-abdomen, consistent with its location in the upper stomach. BOWEL: Nonobstructive bowel gas pattern. SOFT TISSUES: No opaque urinary calculi. BONES: No acute osseous abnormality. LUNGS: Infiltrates demonstrated in the left lung base. IMPRESSION: 1. Enteric tube tip projects over the upper mid-abdomen, consistent with its location in the upper stomach. 2. Left basilar pulmonary infiltrates. Electronically signed by: Elsie Gravely MD 06/15/2024 09:27 PM EST RP Workstation: [id number]   DG Chest Port 1 View Result Date: 06/15/2024 EXAM: 1 VIEW(S) XRAY OF THE CHEST 06/15/2024 09:23:00 PM COMPARISON: Comparison with 06/15/2024. CLINICAL HISTORY: [id number] Encounter for intubation [id number] Encounter for intubation. FINDINGS: LINES, TUBES AND DEVICES: Interval placement of an endotracheal tube with the tip measuring 3.1 cm above the carina. An enteric tube has been placed. The tip is off the field of view but below the left hemidiaphragm. LUNGS AND PLEURA: Shallow inspiration. Airspace infiltrates in the right perihilar region and throughout the left lung, similar to the prior study. This may represent pneumonia, edema, or aspiration. No pleural effusion. No pneumothorax. HEART AND MEDIASTINUM: Cardiac enlargement. Calcification of the aorta. BONES AND SOFT TISSUES: No acute osseous abnormality. IMPRESSION: 1. Interval placement of an endotracheal tube with the tip measuring 3.1 cm above the carina and an enteric tube with the tip off the  field of view but below the left hemidiaphragm. 2. Airspace infiltrates in the right perihilar region and throughout the left lung, similar to the prior study, which may represent pneumonia, edema, or aspiration. 3. Cardiac enlargement. Electronically signed by: Elsie Gravely MD 06/15/2024 09:26 PM EST RP Workstation: [id number]   DG Chest Portable 1 View Result Date: 06/15/2024 CLINICAL DATA:  Cough, hematemesis EXAM: PORTABLE CHEST 1 VIEW COMPARISON:  08/28/2021 FINDINGS: Single frontal view of the chest demonstrates an enlarged cardiac silhouette. There is left perihilar airspace disease, consistent with hemorrhage, asymmetric edema, or infection. No effusion or pneumothorax. The right chest is clear. IMPRESSION: 1. Left perihilar airspace disease consistent with hemorrhage, infection, or asymmetric edema. 2. Enlarged cardiac silhouette. Electronically Signed   By: Ozell Daring M.D.   On: 06/15/2024 20:37    Labs: Basic Metabolic Panel: Recent Labs  Lab 06/18/24 0335 06/18/24 0355 06/19/24 0250 06/19/24 1407 06/19/24 2000 06/20/24 0515 06/20/24 2205 06/21/24 0352 06/22/24 0500 06/23/24 0500 06/24/24 0800  NA 136  --  140  --   --  146* 145 146* 150* 145 141  K 3.3*  --  3.1* 3.2*   < > 3.4* 3.9 4.0 3.6 3.9 3.9  CL 99  --  99  --   --  106 104 103 105 102 101  CO2 23  --  27  --   --  29 29 30  33* 33* 33*  GLUCOSE 130*  --  123*  --   --  128* 122* 130* 113* 94 89  BUN 32*  --  34*  --   --  35* 41* 42* 50* 46* 32*  CREATININE 1.12*  --  1.16*  --   --  1.04* 1.04* 1.08* 1.12* 0.94 0.67  CALCIUM 8.0*  --  8.4*  --   --  8.5* 9.0 9.1 9.5 9.3 9.0  MG  --  2.1  --  2.5*  --  2.7*  --  3.0* 3.2*  --   --   PHOS 3.3  --  2.9  --   --  2.4*  --  3.4  --   --   --    < > =  values in this interval not displayed.   CBC: Recent Labs  Lab 06/18/24 0335 06/19/24 0250 06/20/24 0515 06/21/24 0352 06/22/24 0500  WBC 14.6* 8.6 9.0 9.2 9.2  HGB 10.1* 9.9* 10.5* 10.8* 10.7*  HCT 30.0*  30.5* 32.6* 34.6* 34.5*  MCV 92.9 93.6 96.7 99.1 97.7  PLT 104* 107* 106* 105* 110*   Microbiology: Results for orders placed or performed during the hospital encounter of 06/15/24  Blood Culture (routine x 2)     Status: Abnormal   Collection Time: 06/15/24 10:03 PM   Specimen: BLOOD  Result Value Ref Range Status   Specimen Description   Final    BLOOD LEFT ANTECUBITAL Performed at [facility], [address], [place], [place] [address]    Special Requests   Final    BOTTLES DRAWN AEROBIC AND ANAEROBIC Blood Culture adequate volume Performed at [facility], [address] [place], [place] [address]    Culture  Setup Time   Final    GRAM POSITIVE RODS AEROBIC BOTTLE ONLY CRITICAL RESULT CALLED TO, READ BACK BY AND VERIFIED WITH:  LISA KLUTTZ AT 2339 06/18/24 JG Performed at [facility], [address], [place], [place] [address]    Culture (A)  Final    DIPHTHEROIDS(CORYNEBACTERIUM SPECIES) CRITICAL RESULT CALLED TO, READ BACK BY AND VERIFIED WITH: PHARMD MERRILL, K. 1201 AT 1128, ADC Standardized susceptibility testing for this organism is not available. Performed at [facility], [address], [place], [place] [address]    Report Status 06/23/2024 FINAL  Final  Blood Culture (routine x 2)     Status: None   Collection Time: 06/15/24 10:03 PM   Specimen: BLOOD  Result Value Ref Range Status   Specimen Description BLOOD RIGHT ANTECUBITAL  Final   Special Requests   Final    BOTTLES DRAWN AEROBIC AND ANAEROBIC Blood Culture results may not be optimal due to an inadequate volume of blood received in culture bottles   Culture   Final    NO GROWTH 5 DAYS Performed at [facility], [address], [place], [place] [address]    Report Status 06/20/2024 FINAL  Final  Resp panel by RT-PCR (RSV, Flu A&B, Covid) Nasal Mucosa     Status: None   Collection Time: 06/15/24 11:45 PM   Specimen: Nasal Mucosa; Nasal Swab  Result Value  Ref Range Status   SARS Coronavirus 2 by RT PCR NEGATIVE NEGATIVE Final    Comment: (NOTE) SARS-CoV-2 target nucleic acids are NOT DETECTED.  The SARS-CoV-2 RNA is generally detectable in upper respiratory specimens during the acute phase of infection. The lowest concentration of SARS-CoV-2 viral copies this assay can detect is 138 copies/mL. A negative result does not preclude SARS-Cov-2 infection and should not be used as the sole basis for treatment or other patient management decisions. A negative result may occur with  improper specimen collection/handling, submission of specimen other than nasopharyngeal swab, presence of viral mutation(s) within the areas targeted by this assay, and inadequate number of viral copies(<138 copies/mL). A negative result must be combined with clinical observations, patient history, and epidemiological information. The expected result is Negative.  Fact Sheet for Patients:  bloggercourse.com  Fact Sheet for Healthcare Providers:  seriousbroker.it  This test is no t yet approved or cleared by the [place]  FDA and  has been authorized for detection and/or diagnosis of SARS-CoV-2 by FDA under an Emergency Use Authorization (EUA). This EUA will remain  in effect (meaning  this test can be used) for the duration of the COVID-19 declaration under Section 564(b)(1) of the Act, 21 U.S.C.section 360bbb-3(b)(1), unless the authorization is terminated  or revoked sooner.       Influenza A by PCR NEGATIVE NEGATIVE Final   Influenza B by PCR NEGATIVE NEGATIVE Final    Comment: (NOTE) The Xpert Xpress SARS-CoV-2/FLU/RSV plus assay is intended as an aid in the diagnosis of influenza from Nasopharyngeal swab specimens and should not be used as a sole basis for treatment. Nasal washings and aspirates are unacceptable for Xpert Xpress SARS-CoV-2/FLU/RSV testing.  Fact Sheet for  Patients: bloggercourse.com  Fact Sheet for Healthcare Providers: seriousbroker.it  This test is not yet approved or cleared by the [place]  FDA and has been authorized for detection and/or diagnosis of SARS-CoV-2 by FDA under an Emergency Use Authorization (EUA). This EUA will remain in effect (meaning this test can be used) for the duration of the COVID-19 declaration under Section 564(b)(1) of the Act, 21 U.S.C. section 360bbb-3(b)(1), unless the authorization is terminated or revoked.     Resp Syncytial Virus by PCR NEGATIVE NEGATIVE Final    Comment: (NOTE) Fact Sheet for Patients: bloggercourse.com  Fact Sheet for Healthcare Providers: seriousbroker.it  This test is not yet approved or cleared by the [place]  FDA and has been authorized for detection and/or diagnosis of SARS-CoV-2 by FDA under an Emergency Use Authorization (EUA). This EUA will remain in effect (meaning this test can be used) for the duration of the COVID-19 declaration under Section 564(b)(1) of the Act, 21 U.S.C. section 360bbb-3(b)(1), unless the authorization is terminated or revoked.  Performed at [facility], [address] [place], [place] [address]   MRSA Next Gen by PCR, Nasal     Status: None   Collection Time: 06/15/24 11:45 PM   Specimen: Nasal Mucosa; Nasal Swab  Result Value Ref Range Status   MRSA by PCR Next Gen NOT DETECTED NOT DETECTED Final    Comment: (NOTE) The GeneXpert MRSA Assay (FDA approved for NASAL specimens only), is one component of a comprehensive MRSA colonization surveillance program. It is not intended to diagnose MRSA infection nor to guide or monitor treatment for MRSA infections. Test performance is not FDA approved in patients less than 16 years old. Performed at [facility], [address] [place], [place] [address]   Culture,  Respiratory w Gram Stain     Status: None   Collection Time: 06/16/24 11:45 AM   Specimen: Tracheal Aspirate; Respiratory  Result Value Ref Range Status   Specimen Description   Final    TRACHEAL ASPIRATE Performed at [facility], [address], [place], [place] [address]    Special Requests   Final    NONE Performed at [facility], [address] [place], [place] [address]    Gram Stain   Final    ABUNDANT WBC PRESENT, PREDOMINANTLY PMN RARE GRAM POSITIVE COCCI    Culture   Final    FEW MORAXELLA CATARRHALIS(BRANHAMELLA) BETA LACTAMASE POSITIVE Performed at [facility], [address], [place], [place] [address]    Report Status 06/18/2024 FINAL  Final  Respiratory (~20 pathogens) panel by PCR     Status: None   Collection Time: 06/16/24  1:30 PM   Specimen: Nasopharyngeal Swab; Respiratory  Result Value Ref Range Status   Adenovirus NOT DETECTED NOT DETECTED Final   Coronavirus 229E NOT DETECTED NOT DETECTED Final    Comment: (NOTE) The Coronavirus  on the Respiratory Panel, DOES NOT test for the novel  Coronavirus (2019 nCoV)    Coronavirus HKU1 NOT DETECTED NOT DETECTED Final   Coronavirus NL63 NOT DETECTED NOT DETECTED Final   Coronavirus OC43 NOT DETECTED NOT DETECTED Final   Metapneumovirus NOT DETECTED NOT DETECTED Final   Rhinovirus / Enterovirus NOT DETECTED NOT DETECTED Final   Influenza A NOT DETECTED NOT DETECTED Final   Influenza B NOT DETECTED NOT DETECTED Final   Parainfluenza Virus 1 NOT DETECTED NOT DETECTED Final   Parainfluenza Virus 2 NOT DETECTED NOT DETECTED Final   Parainfluenza Virus 3 NOT DETECTED NOT DETECTED Final   Parainfluenza Virus 4 NOT DETECTED NOT DETECTED Final   Respiratory Syncytial Virus NOT DETECTED NOT DETECTED Final   Bordetella pertussis NOT DETECTED NOT DETECTED Final   Bordetella Parapertussis NOT DETECTED NOT DETECTED Final   Chlamydophila pneumoniae NOT DETECTED NOT DETECTED Final   Mycoplasma  pneumoniae NOT DETECTED NOT DETECTED Final    Comment: Performed at [facility], [address] [place], [place] [address]    Time coordinating discharge: Over 30 minutes  Marien LITTIE Piety, MD  [facility] 06/24/2024, 4:54 PM

## 2024-06-24 NOTE — Discharge Instructions (Addendum)
 Please make an appointment within 1 week with your PCP for hospital follow up.  Follow up with cardiology in about 4 weeks.  Home health PT and OT and SLP were ordered to come to your house. I recommend going minimal distances without the therapists present (for example: only going to bedside commode for toileting and NOT going to a bathroom if its further away) until you have done this with the therapists.    PO Diet Recommendation: Dysphagia 3 (Mechanical soft);Thin liquids (Level 0) (meats minced; gravies to moisten) Liquid Administration via: Cup;Straw(stop straw use if coughing noted w/) Medication Administration: Whole meds with puree (vs 1 at a time w/ Water) Supervision: Patient able to self-feed;Intermittent supervision/cueing for swallowing strategies;Set-up assistance for safety (encouragement) Postural changes: Position pt fully upright for meals;Stay upright 30-60 min after meals;Out of bed for meals (Reflux precs.) Oral care recommendations: Oral care BID (2x/day);Pt independent with oral care;Staff/trained caregiver to provide oral care (Denture care) Recommended consults:  (Dietician f/u)

## 2024-06-24 NOTE — Plan of Care (Signed)
 Problem: Activity: Goal: Ability to tolerate increased activity will improve Outcome: Progressing   Problem: Respiratory: Goal: Ability to maintain a clear airway and adequate ventilation will improve Outcome: Progressing   Problem: Role Relationship: Goal: Method of communication will improve Outcome: Progressing   Problem: Education: Goal: Knowledge of General Education information will improve Description: Including pain rating scale, medication(s)/side effects and non-pharmacologic comfort measures Outcome: Progressing   Problem: Health Behavior/Discharge Planning: Goal: Ability to manage health-related needs will improve Outcome: Progressing   Problem: Clinical Measurements: Goal: Ability to maintain clinical measurements within normal limits will improve Outcome: Progressing Goal: Will remain free from infection Outcome: Progressing Goal: Diagnostic test results will improve Outcome: Progressing Goal: Respiratory complications will improve Outcome: Progressing Goal: Cardiovascular complication will be avoided Outcome: Progressing   Problem: Activity: Goal: Risk for activity intolerance will decrease Outcome: Progressing   Problem: Nutrition: Goal: Adequate nutrition will be maintained Outcome: Progressing   Problem: Coping: Goal: Level of anxiety will decrease Outcome: Progressing   Problem: Elimination: Goal: Will not experience complications related to bowel motility Outcome: Progressing Goal: Will not experience complications related to urinary retention Outcome: Progressing   Problem: Pain Managment: Goal: General experience of comfort will improve and/or be controlled Outcome: Progressing   Problem: Safety: Goal: Ability to remain free from injury will improve Outcome: Progressing   Problem: Skin Integrity: Goal: Risk for impaired skin integrity will decrease Outcome: Progressing   Problem: Education: Goal: Ability to identify signs and  symptoms of gastrointestinal bleeding will improve Outcome: Progressing   Problem: Bowel/Gastric: Goal: Will show no signs and symptoms of gastrointestinal bleeding Outcome: Progressing   Problem: Fluid Volume: Goal: Will show no signs and symptoms of excessive bleeding Outcome: Progressing   Problem: Clinical Measurements: Goal: Complications related to the disease process, condition or treatment will be avoided or minimized Outcome: Progressing   Problem: Education: Goal: Ability to demonstrate management of disease process will improve Outcome: Progressing Goal: Ability to verbalize understanding of medication therapies will improve Outcome: Progressing Goal: Individualized Educational Video(s) Outcome: Progressing   Problem: Activity: Goal: Capacity to carry out activities will improve Outcome: Progressing   Problem: Cardiac: Goal: Ability to achieve and maintain adequate cardiopulmonary perfusion will improve Outcome: Progressing   Problem: Education: Goal: Knowledge of disease or condition will improve Outcome: Progressing Goal: Understanding of medication regimen will improve Outcome: Progressing Goal: Individualized Educational Video(s) Outcome: Progressing   Problem: Activity: Goal: Ability to tolerate increased activity will improve Outcome: Progressing   Problem: Cardiac: Goal: Ability to achieve and maintain adequate cardiopulmonary perfusion will improve Outcome: Progressing   Problem: Health Behavior/Discharge Planning: Goal: Ability to safely manage health-related needs after discharge will improve Outcome: Progressing   Problem: Education: Goal: Ability to describe self-care measures that may prevent or decrease complications (Diabetes Survival Skills Education) will improve Outcome: Progressing Goal: Individualized Educational Video(s) Outcome: Progressing   Problem: Coping: Goal: Ability to adjust to condition or change in health will  improve Outcome: Progressing   Problem: Fluid Volume: Goal: Ability to maintain a balanced intake and output will improve Outcome: Progressing   Problem: Health Behavior/Discharge Planning: Goal: Ability to identify and utilize available resources and services will improve Outcome: Progressing Goal: Ability to manage health-related needs will improve Outcome: Progressing   Problem: Metabolic: Goal: Ability to maintain appropriate glucose levels will improve Outcome: Progressing   Problem: Nutritional: Goal: Maintenance of adequate nutrition will improve Outcome: Progressing Goal: Progress toward achieving an optimal weight will improve Outcome: Progressing   Problem:  Skin Integrity: Goal: Risk for impaired skin integrity will decrease Outcome: Progressing

## 2024-06-24 NOTE — TOC CM/SW Note (Signed)
 Patient is not able to walk the distance required to go the bathroom, or he/she is unable to safely negotiate stairs required to access the bathroom.  A 3in1 BSC will alleviate this problem

## 2024-06-24 NOTE — Progress Notes (Signed)
 Speech Language Pathology Treatment: Dysphagia  Patient Details Name: [name] MRN: [id number] DOB: [birth year] Today's Date: 06/24/2024 Time: 0915-1000 SLP Time Calculation (min) (ACUTE ONLY): 45 min  Assessment / Plan / Recommendation Clinical Impression  Pt seen for ongoing assessment of swallowing and trials to upgrade diet. Pt has stated she hates the puree diet and is not eating it. Pt has not had her Full Dentures w/ her this admit until yesterday PM. Pt placed her Dentures- noted fair fit but Adhesive was provided to better secure the Dentures for effective wearing/mastication. Pt is alert, verbally responsive and engaged in w/ this SLP; she was min uncomfortable and wanted to return to bed from the chair. Pt had bee sitting up for a couple of hours. She is on 1L; wbc wnl and afebrile. Dentures in place. Pt and Family who arrived denied any swallowing issues PTA. Pt endorsed she wears her Dentures to eat most of the time.  Pt explained general aspiration precautions and agreed verbally to the need for following them especially sitting upright for all oral intake and wearing her Dentures for effective mastication -- encouraged Cup drinking and SMALL sips SLOWLY. Pt assisted w/ better upright positioning d/t weakness. She feed herself breakfast meal items of thin liquids and soft solids w/ No overt, clinical s/s of aspiration noted w/ any consistency; respiratory status remained calm and unlabored, vocal quality clear b/t trials, no cough. Oral phase appeared grossly [facility] for bolus management and timely A-P transfer for swallowing; oral clearing achieved w/ all consistencies. Mastication was generally slower as she gave effort to tougher foods such as sausage trials. Pt appeared to lack energy/effort for po tasks- suspect direct impact of Deconditioning from acute illness.  NSG denied any swallowing deficits w/ current po intake.   Pt appears at reduced risk for aspiration/aspiration  pneumonia when following general aspiration precautions w/ oral intake. Recommend a more Mech Soft diet consistency for ease of mastication of soft foods and conservation of energy; Gravies added to moisten foods. Thin liquids- monitor straw use and discontinue if increased coughing noted w/ straw use. Recommend general aspiration precautions; sitting Upright for all oral intake and reduce Distractions/talking at meals. Pills Whole in Puree vs 1 at a time w/ Water to reduce risk for aspiration. Pt would benefit from tray setup and positioning assistance at meals d/t overall weakness/deconditioning. Reflux precautions.  Pt appears close to/at her Baseline at this time. PCP can order for ST services in the Home/next venue of care if needs indicate. ST will sign off at this time w/ MD to reconsult if needed while admitted. MD updated and agreed. NSG updated. Family present and updated, agreed. Precautions posted at bedside, room.  Recommend Dietician f/u for support.       HPI HPI: Patient is a 73 year old female with acute hypoxic respiratory failure s/t suspected aspiration pneumonia in the setting of suspected acute upper GIB; requiring brief Intubation/extubation 11/24-27.  PMH: migraines, Obesity, atrial fibrillation. Pt wears Full Dentures when eatintg and now has them w/ her in room.      SLP Plan  All goals met        Swallow Evaluation Recommendations   Recommendations: PO diet PO Diet Recommendation: Dysphagia 3 (Mechanical soft);Thin liquids (Level 0) (meats minced; gravies to moisten) Liquid Administration via: Cup;Straw(stop straw use if coughing noted w/) Medication Administration: Whole meds with puree (vs 1 at a time w/ Water) Supervision: Patient able to self-feed;Intermittent supervision/cueing for swallowing strategies;Set-up assistance  for safety (encouragement) Postural changes: Position pt fully upright for meals;Stay upright 30-60 min after meals;Out of bed for meals  (Reflux precs.) Oral care recommendations: Oral care BID (2x/day);Pt independent with oral care;Staff/trained caregiver to provide oral care (Denture care) Recommended consults:  (Dietician f/u)     Recommendations   General aspiration precautions and support at meals d/t Deconditioning                  Oral care BID;Patient independent with oral care;Staff/trained caregiver to provide oral care (Denture care)   Intermittent Supervision/Assistance Dysphagia, unspecified (R13.10) (now has Dentures; deconditioning/weakness overall)     All goals met         Comer Portugal, MS, CCC-SLP Speech Language Pathologist Rehab Services; [facility] [place] [phone] (ascom) Gradie Butrick  06/24/2024, 3:57 PM

## 2024-06-25 DIAGNOSIS — K226 Gastro-esophageal laceration-hemorrhage syndrome: Secondary | ICD-10-CM | POA: Diagnosis not present

## 2024-06-25 DIAGNOSIS — I509 Heart failure, unspecified: Secondary | ICD-10-CM | POA: Diagnosis not present

## 2024-06-25 LAB — GLUCOSE, CAPILLARY
Glucose-Capillary: 101 mg/dL — ABNORMAL HIGH (ref 70–99)
Glucose-Capillary: 95 mg/dL (ref 70–99)
Glucose-Capillary: 88 mg/dL (ref 70–99)
Glucose-Capillary: 94 mg/dL (ref 70–99)

## 2024-06-25 MED ORDER — FUROSEMIDE 10 MG/ML IJ SOLN
40.0000 mg | Freq: Two times a day (BID) | INTRAMUSCULAR | Status: DC
  Administered 2024-06-25 – 2024-06-27 (×6): 40 mg via INTRAVENOUS
  Filled 2024-06-25 (×6): qty 4

## 2024-06-25 MED ORDER — ONDANSETRON HCL 4 MG/2ML IJ SOLN
4.0000 mg | Freq: Once | INTRAMUSCULAR | Status: AC
  Administered 2024-06-25: 4 mg via INTRAVENOUS
  Filled 2024-06-25: qty 2

## 2024-06-25 MED ORDER — PREDNISONE 20 MG PO TABS
20.0000 mg | ORAL_TABLET | Freq: Every day | ORAL | Status: AC
  Administered 2024-06-26 – 2024-06-27 (×2): 20 mg via ORAL
  Filled 2024-06-25 (×2): qty 1

## 2024-06-25 NOTE — Progress Notes (Signed)
 PROGRESS NOTE  [name]  FMW:[id number] DOB: [birth year] DOA: 06/15/2024 PCP: Odell Tor Edra [name], MD  Chief Complaint  Patient presents with   Hematemesis   Hospital Course:  [name] 73 year old female who presented with cough, and hematemesis.  She was in acute hypoxic respiratory failure secondary to suspected aspiration pneumonia in the setting of upper GI bleed.  She required intubation and was admitted to the ICU.  On 11/25 she underwent EGD which revealed Mallory-Weiss tear without active bleeding.  She was successfully extubated on 11/27.  She remained on pressors until 11/29.  ICU stay further complicated by A-fib with RVR and decompensated heart failure.  She was started on amiodarone  drip.  Cardiology was consulted.  On 12/1 she was transferred to the [facility] service. 12/1: Patient alert and oriented x 3.  Participated with speech therapy eval.  Started on diet 12/2: Doing well, working with PT/OT. SNF was recommended but patient declines. She has very strong family support at home and transport to follow up appointments.  12/3: discharge with home health delayed due to needing hospital bed and patient more lethargic today. Has needed to be placed back on 2Lnc. She is likely aspirating continuously. Will recheck tomorrow if able to dc home.   Subjective: Patient  states she doesn't feel well. She points to right leg as having pain. Unable to further describe what is the problem. She denies chest pain, SOB, headache, nausea.  Has a coughing spell throughout my encounter.  She really wants to go home today  Objective: Vitals:   06/25/24 0451 06/25/24 0500 06/25/24 0717 06/25/24 0723  BP: (!) 165/69   (!) 165/81  Pulse: 80   82  Resp: 20   20  Temp: 97.6 F (36.4 C)   97.8 F (36.6 C)  TempSrc:      SpO2: 98%  97% 96%  Weight:  134.5 kg    Height:        Intake/Output Summary (Last 24 hours) at 06/25/2024 0738 Last data filed at 06/25/2024 0556 Gross per 24  hour  Intake 160 ml  Output 2500 ml  Net -2340 ml   Filed Weights   06/23/24 0523 06/24/24 0450 06/25/24 0500  Weight: (!) 136.4 kg 132.9 kg 134.5 kg   Examination: General exam: Appears calm and comfortable, NAD, appears older than stated age. Lethargic.  Respiratory system: No increased work of breathing, rhonchorous upper airway sounds. No wheezing. Dry cough during encounter Cardiovascular system: S1 & S2 heard, RRR. Significant LE edema bilaterally but daughter at bedside states that this is her baseline swelling amount Neuro: Alert and oriented. Extremities: Symmetric, expected ROM Skin: No rashes, lesions Psychiatry: Demonstrates appropriate judgement and insight. Mood & affect appropriate for situation.   Assessment & Plan:  Principal Problem:   GIB (gastrointestinal bleeding) Active Problems:   Septic shock (HCC)   Hematemesis   Mallory-Weiss tear    Aspiration pneumonia Acute hypoxic respiratory failure-s/p intubation. weaned to room air. And fluctuates back to 2Lnc - Secondary to aspiration pneumonia and upper GI bleed - Extubated 11/26 - Continue DuoNebs - Tapering Solu-Medrol , now prednisone  - Status post 7 days Unasyn  - Continue aspiration precautions  Septic shock, resolved - Required pressors while in the ICU.  Blood pressure stable now  Paroxysmal A-fib with RVR - Initiated on amiodarone  drip in ICU, appears she is not on anything for maintenance now.  Currently rate controlled - History of A-fib, not on Eliquis  at home due to expense -  Eliquis  not resumed during this admission due to active GI bleed  Acute on chronic diastolic heart failure Pulmonary hypertension - proBNP 6725 - Echocardiogram 06/16/2024: LVEF 60 to 65%, grade 2 diastolic dysfunction, moderately elevated pulmonary arterial systolic pressure, tricuspid regurg with right atrial pressure of 15 mmHg - As needed Lasix . Dose ordered for today - Will need close outpatient follow-up with  cardiology  Acute upper GI bleed Acute blood loss anemia Mallory-Weiss tear - Mallory-Weiss tear seen on EGD 11/25. - Initial CTA GI bleed negative - Continue to monitor hemoglobin, transfuse if less than 7  AKI - Baseline creatinine 0.9, peak at 1.22 this admission - Improved to baseline now - BMP am  Acute metabolic encephalopathy, resolved - Secondary to septic shock and prolonged ICU stay - Head CT 11/24 without acute abnormality - Repeat head CT 11/29 no acute intracranial abnormality - Patient is alert and oriented x 4 today.  Anticipate she would do much better with speech therapy eval he can likely start diet.  Have reached back out to SLP for reevaluation - Frequent reorientation, Delirium precautions  Hypernatremia, resolved - Sodium 150 on 12/1.  Lasix  discontinued.  Unasyn  finished. - Resolved now.  Body mass index is 52.53 kg/m. Obesity Class III - Outpatient follow up for lifestyle modification and risk factor management  Abnormal TSH - TSH 6.380, T4 and T3 WNL - Can follow-up outpatient with PCP to repeat these labs when she is not acutely ill.  DVT prophylaxis: SCDs   Code Status: Full Code Disposition: Medically ready to DC. Now awaiting hh acceptance.   Consultants:  [facility]   Procedures:  Intubation, extubation, pressors EGD  Antimicrobials:  Anti-infectives (From admission, onward)    Start     Dose/Rate Route Frequency Ordered Stop   06/16/24 1215  doxycycline  (VIBRAMYCIN ) 100 mg in sodium chloride  0.9 % 250 mL IVPB        100 mg 125 mL/hr over 120 Minutes Intravenous 2 times daily 06/16/24 1120 06/20/24 2359   06/16/24 1100  azithromycin  (ZITHROMAX ) 500 mg in sodium chloride  0.9 % 250 mL IVPB  Status:  Discontinued        500 mg 250 mL/hr over 60 Minutes Intravenous Every 24 hours 06/16/24 1008 06/16/24 1120   06/16/24 0800  Ampicillin -Sulbactam (UNASYN ) 3 g in sodium chloride  0.9 % 100 mL IVPB        3 g 200 mL/hr over 30  Minutes Intravenous Every 6 hours 06/16/24 0056 06/22/24 2121   06/16/24 0130  vancomycin  (VANCOREADY) IVPB 2000 mg/400 mL        2,000 mg 200 mL/hr over 120 Minutes Intravenous  Once 06/16/24 0032 06/16/24 0335   06/15/24 2200  erythromycin  250 mg in sodium chloride  0.9 % 100 mL IVPB        250 mg 100 mL/hr over 60 Minutes Intravenous Once 06/15/24 2050 06/16/24 0132   06/15/24 2115  ceFEPIme  (MAXIPIME ) 2 g in sodium chloride  0.9 % 100 mL IVPB        2 g 200 mL/hr over 30 Minutes Intravenous  Once 06/15/24 2103 06/16/24 0131   06/15/24 2115  metroNIDAZOLE  (FLAGYL ) IVPB 500 mg        500 mg 100 mL/hr over 60 Minutes Intravenous  Once 06/15/24 2103 06/16/24 0226   06/15/24 2115  vancomycin  (VANCOCIN ) IVPB 1000 mg/200 mL premix  Status:  Discontinued        1,000 mg 200 mL/hr over 60 Minutes Intravenous  Once 06/15/24 2103  06/16/24 0032      Data Reviewed: I have personally reviewed following labs and imaging studies CBC: Recent Labs  Lab 06/19/24 0250 06/20/24 0515 06/21/24 0352 06/22/24 0500  WBC 8.6 9.0 9.2 9.2  HGB 9.9* 10.5* 10.8* 10.7*  HCT 30.5* 32.6* 34.6* 34.5*  MCV 93.6 96.7 99.1 97.7  PLT 107* 106* 105* 110*   Basic Metabolic Panel: Recent Labs  Lab 06/19/24 0250 06/19/24 1407 06/19/24 2000 06/20/24 0515 06/20/24 2205 06/21/24 0352 06/22/24 0500 06/23/24 0500 06/24/24 0800  NA 140  --   --  146* 145 146* 150* 145 141  K 3.1* 3.2*   < > 3.4* 3.9 4.0 3.6 3.9 3.9  CL 99  --   --  106 104 103 105 102 101  CO2 27  --   --  29 29 30  33* 33* 33*  GLUCOSE 123*  --   --  128* 122* 130* 113* 94 89  BUN 34*  --   --  35* 41* 42* 50* 46* 32*  CREATININE 1.16*  --   --  1.04* 1.04* 1.08* 1.12* 0.94 0.67  CALCIUM 8.4*  --   --  8.5* 9.0 9.1 9.5 9.3 9.0  MG  --  2.5*  --  2.7*  --  3.0* 3.2*  --   --   PHOS 2.9  --   --  2.4*  --  3.4  --   --   --    < > = values in this interval not displayed.   GFR: Estimated Creatinine Clearance: 85.5 mL/min (by C-G formula  based on SCr of 0.67 mg/dL). Liver Function Tests: Recent Labs  Lab 06/19/24 0250 06/20/24 0515 06/21/24 0352  ALBUMIN 3.7 3.8 4.1   CBG: Recent Labs  Lab 06/24/24 0807 06/24/24 1203 06/24/24 1653 06/24/24 2056 06/25/24 0721  GLUCAP 92 142* 121* 111* 101*    Recent Results (from the past 240 hours)  Blood Culture (routine x 2)     Status: Abnormal   Collection Time: 06/15/24 10:03 PM   Specimen: BLOOD  Result Value Ref Range Status   Specimen Description   Final    BLOOD LEFT ANTECUBITAL Performed at [facility], [address], [place], [place] [address]    Special Requests   Final    BOTTLES DRAWN AEROBIC AND ANAEROBIC Blood Culture adequate volume Performed at [facility], [address] [place], [place] [address]    Culture  Setup Time   Final    GRAM POSITIVE RODS AEROBIC BOTTLE ONLY CRITICAL RESULT CALLED TO, READ BACK BY AND VERIFIED WITH:  LISA KLUTTZ AT 2339 06/18/24 JG Performed at [facility], [address] [place], [place] [address]    Culture (A)  Final    DIPHTHEROIDS(CORYNEBACTERIUM SPECIES) CRITICAL RESULT CALLED TO, READ BACK BY AND VERIFIED WITH: PHARMD MERRILL, K. 1201 AT 1128, ADC Standardized susceptibility testing for this organism is not available. Performed at [facility], [address] [place], [place] [address]    Report Status 06/23/2024 FINAL  Final  Blood Culture (routine x 2)     Status: None   Collection Time: 06/15/24 10:03 PM   Specimen: BLOOD  Result Value Ref Range Status   Specimen Description BLOOD RIGHT ANTECUBITAL  Final   Special Requests   Final    BOTTLES DRAWN AEROBIC AND ANAEROBIC Blood Culture results may not be optimal due to an inadequate volume of blood received in culture bottles   Culture  Final    NO GROWTH 5 DAYS Performed at [facility], [address] [place], [place] [address]    Report Status 06/20/2024 FINAL  Final  Resp panel by RT-PCR (RSV, Flu  A&B, Covid) Nasal Mucosa     Status: None   Collection Time: 06/15/24 11:45 PM   Specimen: Nasal Mucosa; Nasal Swab  Result Value Ref Range Status   SARS Coronavirus 2 by RT PCR NEGATIVE NEGATIVE Final    Comment: (NOTE) SARS-CoV-2 target nucleic acids are NOT DETECTED.  The SARS-CoV-2 RNA is generally detectable in upper respiratory specimens during the acute phase of infection. The lowest concentration of SARS-CoV-2 viral copies this assay can detect is 138 copies/mL. A negative result does not preclude SARS-Cov-2 infection and should not be used as the sole basis for treatment or other patient management decisions. A negative result may occur with  improper specimen collection/handling, submission of specimen other than nasopharyngeal swab, presence of viral mutation(s) within the areas targeted by this assay, and inadequate number of viral copies(<138 copies/mL). A negative result must be combined with clinical observations, patient history, and epidemiological information. The expected result is Negative.  Fact Sheet for Patients:  bloggercourse.com  Fact Sheet for Healthcare Providers:  seriousbroker.it  This test is no t yet approved or cleared by the [place]  FDA and  has been authorized for detection and/or diagnosis of SARS-CoV-2 by FDA under an Emergency Use Authorization (EUA). This EUA will remain  in effect (meaning this test can be used) for the duration of the COVID-19 declaration under Section 564(b)(1) of the Act, 21 U.S.C.section 360bbb-3(b)(1), unless the authorization is terminated  or revoked sooner.       Influenza A by PCR NEGATIVE NEGATIVE Final   Influenza B by PCR NEGATIVE NEGATIVE Final    Comment: (NOTE) The Xpert Xpress SARS-CoV-2/FLU/RSV plus assay is intended as an aid in the diagnosis of influenza from Nasopharyngeal swab specimens and should not be used as a sole basis for treatment.  Nasal washings and aspirates are unacceptable for Xpert Xpress SARS-CoV-2/FLU/RSV testing.  Fact Sheet for Patients: bloggercourse.com  Fact Sheet for Healthcare Providers: seriousbroker.it  This test is not yet approved or cleared by the [place]  FDA and has been authorized for detection and/or diagnosis of SARS-CoV-2 by FDA under an Emergency Use Authorization (EUA). This EUA will remain in effect (meaning this test can be used) for the duration of the COVID-19 declaration under Section 564(b)(1) of the Act, 21 U.S.C. section 360bbb-3(b)(1), unless the authorization is terminated or revoked.     Resp Syncytial Virus by PCR NEGATIVE NEGATIVE Final    Comment: (NOTE) Fact Sheet for Patients: bloggercourse.com  Fact Sheet for Healthcare Providers: seriousbroker.it  This test is not yet approved or cleared by the [place]  FDA and has been authorized for detection and/or diagnosis of SARS-CoV-2 by FDA under an Emergency Use Authorization (EUA). This EUA will remain in effect (meaning this test can be used) for the duration of the COVID-19 declaration under Section 564(b)(1) of the Act, 21 U.S.C. section 360bbb-3(b)(1), unless the authorization is terminated or revoked.  Performed at [facility], [address] [place], [place] [address]   MRSA Next Gen by PCR, Nasal     Status: None   Collection Time: 06/15/24 11:45 PM   Specimen: Nasal Mucosa; Nasal Swab  Result Value Ref Range Status   MRSA by PCR Next Gen NOT DETECTED NOT DETECTED Final    Comment: (NOTE) The GeneXpert  MRSA Assay (FDA approved for NASAL specimens only), is one component of a comprehensive MRSA colonization surveillance program. It is not intended to diagnose MRSA infection nor to guide or monitor treatment for MRSA infections. Test performance is not FDA approved in patients less than  6 years old. Performed at [facility], [address] [place], [place] [address]   Culture, Respiratory w Gram Stain     Status: None   Collection Time: 06/16/24 11:45 AM   Specimen: Tracheal Aspirate; Respiratory  Result Value Ref Range Status   Specimen Description   Final    TRACHEAL ASPIRATE Performed at [facility], [address], [place], [place] [address]    Special Requests   Final    NONE Performed at [facility], [address] [place], [place] [address]    Gram Stain   Final    ABUNDANT WBC PRESENT, PREDOMINANTLY PMN RARE GRAM POSITIVE COCCI    Culture   Final    FEW MORAXELLA CATARRHALIS(BRANHAMELLA) BETA LACTAMASE POSITIVE Performed at [facility], [address], [place], [place] [address]    Report Status 06/18/2024 FINAL  Final  Respiratory (~20 pathogens) panel by PCR     Status: None   Collection Time: 06/16/24  1:30 PM   Specimen: Nasopharyngeal Swab; Respiratory  Result Value Ref Range Status   Adenovirus NOT DETECTED NOT DETECTED Final   Coronavirus 229E NOT DETECTED NOT DETECTED Final    Comment: (NOTE) The Coronavirus on the Respiratory Panel, DOES NOT test for the novel  Coronavirus (2019 nCoV)    Coronavirus HKU1 NOT DETECTED NOT DETECTED Final   Coronavirus NL63 NOT DETECTED NOT DETECTED Final   Coronavirus OC43 NOT DETECTED NOT DETECTED Final   Metapneumovirus NOT DETECTED NOT DETECTED Final   Rhinovirus / Enterovirus NOT DETECTED NOT DETECTED Final   Influenza A NOT DETECTED NOT DETECTED Final   Influenza B NOT DETECTED NOT DETECTED Final   Parainfluenza Virus 1 NOT DETECTED NOT DETECTED Final   Parainfluenza Virus 2 NOT DETECTED NOT DETECTED Final   Parainfluenza Virus 3 NOT DETECTED NOT DETECTED Final   Parainfluenza Virus 4 NOT DETECTED NOT DETECTED Final   Respiratory Syncytial Virus NOT DETECTED NOT DETECTED Final   Bordetella pertussis NOT DETECTED NOT DETECTED Final   Bordetella  Parapertussis NOT DETECTED NOT DETECTED Final   Chlamydophila pneumoniae NOT DETECTED NOT DETECTED Final   Mycoplasma pneumoniae NOT DETECTED NOT DETECTED Final    Comment: Performed at [facility], [address] [place], [place] [address]     Radiology Studies: No results found.  Scheduled Meds:  budesonide  (PULMICORT ) nebulizer solution  0.5 mg Nebulization BID   Chlorhexidine  Gluconate Cloth  6 each Topical Daily   feeding supplement  237 mL Oral BID BM   insulin  aspart  0-9 Units Subcutaneous TID WC   methylPREDNISolone  (SOLU-MEDROL ) injection  20 mg Intravenous Daily   multivitamin with minerals  1 tablet Oral Daily   pantoprazole  (PROTONIX ) IV  40 mg Intravenous Q12H   sodium chloride  flush  10-40 mL Intracatheter Q12H    LOS: 10 days  MDM: Patient is high risk for one or more organ failure.  They necessitate ongoing hospitalization for continued IV therapies and subsequent lab monitoring. Total time spent interpreting labs and vitals, reviewing the medical record, coordinating care amongst consultants and care team members, directly assessing and discussing care with the patient and/or family: 55 min  Sue LITTIE Piety, DO [facility]  To contact the attending physician between 7A-7P please use Epic Chat. To contact the covering physician during after hours 7P-7A, please review Amion.  06/25/2024, 7:38 AM

## 2024-06-25 NOTE — Progress Notes (Signed)
 Physical Therapy Treatment Patient Details Name: [name] MRN: [id number] DOB: 1950/10/02 Today's Date: 06/25/2024   History of Present Illness Patient is a 73 year old female with acute hypoxic respiratory failure s/t suspected aspiration pneumonia in the setting of suspected acute upper GIB requiring vent support. Extubated 11/27. PMH: migraines, atrial fibrillation.    PT Comments  Patient is agreeable to PT session. She reports no pain but is moaning out at times. She required +2 person assistance for bed mobility. She was able to stand briefly with +2 person assistance but standing tolerance is limited for ambulation. She is fatigued with minimal activity. Noted patient planning to discharge home with family support. Consider wheelchair as patient unable to ambulate a household distance at this time. Consider rehabilitation <3 hours/day if patient does not have enough support at home.    If plan is discharge home, recommend the following: A lot of help with walking and/or transfers;A lot of help with bathing/dressing/bathroom;Assistance with cooking/housework;Supervision due to cognitive status;Help with stairs or ramp for entrance;Assist for transportation   Can travel by private vehicle     No  Equipment Recommendations  Wheelchair (measurements PT);BSC/3in1    Recommendations for Other Services       Precautions / Restrictions Precautions Precautions: Fall Recall of Precautions/Restrictions: Impaired Restrictions Weight Bearing Restrictions Per Provider Order: No     Mobility  Bed Mobility Overal bed mobility: Needs Assistance Bed Mobility: Supine to Sit, Sit to Supine     Supine to sit: Mod assist, +2 for physical assistance Sit to supine: Mod assist, +2 for physical assistance   General bed mobility comments: assistance for trunk and BLE support. cues for technique, initiation    Transfers Overall transfer level: Needs assistance Equipment used: Rolling  walker (2 wheels) Transfers: Sit to/from Stand Sit to Stand: Mod assist, +2 physical assistance           General transfer comment: lifting and lowering assistance provided. cues for technique    Ambulation/Gait             Pre-gait activities: standing tolerance limited for progression of ambulation.     Stairs             Wheelchair Mobility     Tilt Bed    Modified Rankin (Stroke Patients Only)       Balance Overall balance assessment: Needs assistance Sitting-balance support: Feet supported Sitting balance-Leahy Scale: Fair     Standing balance support: Bilateral upper extremity supported, Reliant on assistive device for balance, During functional activity Standing balance-Leahy Scale: Poor Standing balance comment: +2 person assistance required                            Communication Communication Communication: Impaired Factors Affecting Communication: Reduced clarity of speech  Cognition Arousal: Alert Behavior During Therapy: WFL for tasks assessed/performed   PT - Cognitive impairments: No family/caregiver present to determine baseline, Safety/Judgement, Awareness, Memory, Sequencing, Initiation Difficult to assess due to: Impaired communication                     PT - Cognition Comments: moaning out occasionally Following commands: Impaired Following commands impaired: Follows one step commands with increased time    Cueing Cueing Techniques: Verbal cues, Gestural cues, Tactile cues, Visual cues  Exercises      General Comments        Pertinent Vitals/Pain Pain Assessment Pain Assessment: No/denies pain (although  patient moaning out occasionally during session)    Home Living                          Prior Function            PT Goals (current goals can now be found in the care plan section) Acute Rehab PT Goals Patient Stated Goal: to go back home PT Goal Formulation: With family Time  For Goal Achievement: 07/03/24 Potential to Achieve Goals: Good Progress towards PT goals: Progressing toward goals    Frequency    Min 2X/week      PT Plan      Co-evaluation PT/OT/SLP Co-Evaluation/Treatment: Yes Reason for Co-Treatment: For patient/therapist safety;Complexity of the patient's impairments (multi-system involvement);To address functional/ADL transfers PT goals addressed during session: Mobility/safety with mobility;Balance OT goals addressed during session: ADL's and self-care      AM-PAC PT 6 Clicks Mobility   Outcome Measure  Help needed turning from your back to your side while in a flat bed without using bedrails?: A Lot Help needed moving from lying on your back to sitting on the side of a flat bed without using bedrails?: Total Help needed moving to and from a bed to a chair (including a wheelchair)?: A Lot Help needed standing up from a chair using your arms (e.g., wheelchair or bedside chair)?: A Lot Help needed to walk in hospital room?: Total Help needed climbing 3-5 steps with a railing? : Total 6 Click Score: 9    End of Session   Activity Tolerance: Patient limited by fatigue Patient left: in bed;with call bell/phone within reach;with bed alarm set   PT Visit Diagnosis: Muscle weakness (generalized) (M62.81);Unsteadiness on feet (R26.81);Other abnormalities of gait and mobility (R26.89);Difficulty in walking, not elsewhere classified (R26.2)     Time: 8596-8568 PT Time Calculation (min) (ACUTE ONLY): 28 min  Charges:    $Therapeutic Activity: 8-22 mins PT General Charges $$ ACUTE PT VISIT: 1 Visit                     Randine Essex, PT, MPT   Randine LULLA Essex 06/25/2024, 2:57 PM

## 2024-06-25 NOTE — Progress Notes (Signed)
 Narrative for hospital bed:  The patient requires a hospital bed in the home due to significant limitations with mobility and the need for frequent position changes that can not be safely or effectively achieved with a standard bed. Without this equipment the patient is at increased risk for complications related to immobility and impaired breathing.

## 2024-06-25 NOTE — Progress Notes (Signed)
 Occupational Therapy Treatment Patient Details Name: [name] MRN: [id number] DOB: Sep 26, 1950 Today's Date: 06/25/2024   History of present illness Patient is a 73 year old female with acute hypoxic respiratory failure s/t suspected aspiration pneumonia in the setting of suspected acute upper GIB requiring vent support. Extubated 11/27. PMH: migraines, atrial fibrillation.   OT comments  Pt is supine in bed on arrival. Moans out intermittently during session and reports abdominal pain this date and continues to state I need to pee. Noted with soiled linens requiring changing. Mod A X2 for all bed mobility, cues for initiation and brief tolerance in standing during linen change with Mod Ax2 and cues for hand placement. Unable to progress mobility further this date d/t weakness, poor standing tolerance/balance and pain. Able to sit EOB additional time while taking a bite of jello and drinking water. UB dressing performed with Max A to change out gowns. She will required a hospital bed, wheelchair, BSC and RW to safely return home with 24/7 caregiver support. Continue to recommend <3hrs/day therapy on DC. Pt returned to bed with all needs in place and will cont to require skilled acute OT services to maximize her safety and IND to return to PLOF.       If plan is discharge home, recommend the following:  Two people to help with walking and/or transfers;Two people to help with bathing/dressing/bathroom   Equipment Recommendations  Hospital bed;BSC/3in1;Wheelchair (measurements OT);Other (comment) (RW)    Recommendations for Other Services      Precautions / Restrictions Precautions Precautions: Fall Recall of Precautions/Restrictions: Impaired Restrictions Weight Bearing Restrictions Per Provider Order: No       Mobility Bed Mobility Overal bed mobility: Needs Assistance Bed Mobility: Supine to Sit, Sit to Supine     Supine to sit: Mod assist, +2 for physical assistance Sit  to supine: Mod assist, +2 for physical assistance   General bed mobility comments: cues for intiation and technique, +2 assist to bring trunk and BLEs to EOB as well as forward scoot using chux pads; lateral scoots at EOB with Mod A x2    Transfers Overall transfer level: Needs assistance Equipment used: Rolling walker (2 wheels) Transfers: Sit to/from Stand Sit to Stand: Mod assist, +2 physical assistance, From elevated surface           General transfer comment: cues for inititation and hand placement as pt tries to pull on RW; assist for lift off from elevated bed with minimal standing tolerance     Balance Overall balance assessment: Needs assistance Sitting-balance support: Feet supported Sitting balance-Leahy Scale: Fair Sitting balance - Comments: CGA for seated balance at EOB   Standing balance support: Bilateral upper extremity supported, Reliant on assistive device for balance, During functional activity Standing balance-Leahy Scale: Poor Standing balance comment: +2 external support with minimal standing tolerance                           ADL either performed or assessed with clinical judgement   ADL Overall ADL's : Needs assistance/impaired Eating/Feeding: Supervision/ safety;Sitting;Minimal assistance Eating/Feeding Details (indicate cue type and reason): to eat jello from spoon and drink water from cup while seated EOB             Upper Body Dressing : Maximal assistance;Sitting Upper Body Dressing Details (indicate cue type and reason): change out gowns d/t purewick not hooked up and urination in bed Lower Body Dressing: Maximal assistance Lower Body Dressing  Details (indicate cue type and reason): donn bil socks                    Extremity/Trunk Assessment              Vision       Perception     Praxis     Communication Communication Communication: Impaired Factors Affecting Communication: Reduced clarity of speech    Cognition Arousal: Alert Behavior During Therapy: WFL for tasks assessed/performed                                 Following commands: Impaired Following commands impaired: Follows one step commands with increased time      Cueing   Cueing Techniques: Verbal cues, Gestural cues, Tactile cues, Visual cues  Exercises      Shoulder Instructions       General Comments linens changed during session d/t incontinence/purewick not on    Pertinent Vitals/ Pain       Pain Assessment Pain Assessment: Faces (continues to moan out during session) Faces Pain Scale: Hurts little more Pain Location: abdomen Pain Intervention(s): Monitored during session, Repositioned  Home Living                                          Prior Functioning/Environment              Frequency  Min 2X/week        Progress Toward Goals  OT Goals(current goals can now be found in the care plan section)  Progress towards OT goals: Progressing toward goals  Acute Rehab OT Goals Patient Stated Goal: Go home OT Goal Formulation: With patient/family Time For Goal Achievement: 07/03/24 Potential to Achieve Goals: Fair  Plan      Co-evaluation    PT/OT/SLP Co-Evaluation/Treatment: Yes Reason for Co-Treatment: For patient/therapist safety;Complexity of the patient's impairments (multi-system involvement);To address functional/ADL transfers PT goals addressed during session: Mobility/safety with mobility;Balance OT goals addressed during session: ADL's and self-care      AM-PAC OT 6 Clicks Daily Activity     Outcome Measure   Help from another person eating meals?: A Little Help from another person taking care of personal grooming?: A Lot Help from another person toileting, which includes using toliet, bedpan, or urinal?: A Lot Help from another person bathing (including washing, rinsing, drying)?: A Lot Help from another person to put on and taking off  regular upper body clothing?: A Lot Help from another person to put on and taking off regular lower body clothing?: A Lot 6 Click Score: 13    End of Session Equipment Utilized During Treatment: Rolling walker (2 wheels)  OT Visit Diagnosis: Other abnormalities of gait and mobility (R26.89);Muscle weakness (generalized) (M62.81)   Activity Tolerance Patient tolerated treatment well   Patient Left with call bell/phone within reach;in bed;with bed alarm set   Nurse Communication Mobility status        Time: 8595-8568 OT Time Calculation (min): 27 min  Charges: OT General Charges $OT Visit: 1 Visit OT Treatments $Self Care/Home Management : 8-22 mins  [name], OTR/L  06/25/24, 5:02 PM   [name] 06/25/2024, 5:02 PM

## 2024-06-25 NOTE — TOC Progression Note (Signed)
 Transition of Care [facility]) - Progression Note    Patient Details  Name: [name] MRN: [id number] Date of Birth: 06/12/1951  Transition of [facility]) CM/SW Contact  Dalia GORMAN Fuse, RN Phone Number: 06/25/2024, 11:11 AM  Clinical Narrative:     [facility] accepted referral for [facility] PT/OT/SLP. [facility] to deliver 3 in 1 BSC. TOC will continue to follow.                    Expected Discharge Plan and Services         Expected Discharge Date: 06/24/24                                     Social Drivers of Health (SDOH) Interventions SDOH Screenings   Food Insecurity: No Food Insecurity (04/12/2022)  Housing: Low Risk  (04/12/2022)  Transportation Needs: No Transportation Needs (04/12/2022)  Utilities: Not At Risk (04/12/2022)  Alcohol Screen: Low Risk  (04/12/2022)  Depression (PHQ2-9): Low Risk  (04/12/2022)  Financial Resource Strain: Low Risk  (04/12/2022)  Physical Activity: Inactive (04/12/2022)  Social Connections: Socially Isolated (04/12/2022)  Stress: No Stress Concern Present (04/12/2022)  Tobacco Use: Low Risk  (06/15/2024)    Readmission Risk Interventions     No data to display

## 2024-06-26 DIAGNOSIS — J690 Pneumonitis due to inhalation of food and vomit: Secondary | ICD-10-CM

## 2024-06-26 LAB — BASIC METABOLIC PANEL WITH GFR
Anion gap: 13 (ref 5–15)
BUN: 16 mg/dL (ref 8–23)
CO2: 34 mmol/L — ABNORMAL HIGH (ref 22–32)
Calcium: 9.3 mg/dL (ref 8.9–10.3)
Chloride: 91 mmol/L — ABNORMAL LOW (ref 98–111)
Creatinine, Ser: 0.72 mg/dL (ref 0.44–1.00)
GFR, Estimated: >60 mL/min (ref >60)
Glucose, Bld: 99 mg/dL (ref 70–99)
Potassium: 3.6 mmol/L (ref 3.5–5.1)
Sodium: 138 mmol/L (ref 135–145)

## 2024-06-26 LAB — CBC
HCT: 37.1 % (ref 36.0–46.0)
Hemoglobin: 12.6 g/dL (ref 12.0–15.0)
MCH: 30.5 pg (ref 26.0–34.0)
MCHC: 34 g/dL (ref 30.0–36.0)
MCV: 89.8 fL (ref 80.0–100.0)
Platelets: 93 K/uL — ABNORMAL LOW (ref 150–400)
RBC: 4.13 MIL/uL (ref 3.87–5.11)
RDW: 14.1 % (ref 11.5–15.5)
WBC: 9.5 K/uL (ref 4.0–10.5)
nRBC: 0 % (ref 0.0–0.2)

## 2024-06-26 LAB — GLUCOSE, CAPILLARY
Glucose-Capillary: 85 mg/dL (ref 70–99)
Glucose-Capillary: 94 mg/dL (ref 70–99)
Glucose-Capillary: 96 mg/dL (ref 70–99)
Glucose-Capillary: 89 mg/dL (ref 70–99)

## 2024-06-26 NOTE — Progress Notes (Signed)
 Physical Therapy Treatment Patient Details Name: [name] MRN: [id number] DOB: 18-Oct-1950 Today's Date: 06/26/2024   History of Present Illness Patient is a 73 year old female with acute hypoxic respiratory failure s/t suspected aspiration pneumonia in the setting of suspected acute upper GIB requiring vent support. Extubated 11/27. PMH: migraines, atrial fibrillation.    PT Comments  Pt received in bed receiving care from nursing. Therapist in to assist nursing and pt with transfer to chair and assess current LOF since pt wishing to d/c home. Pt did show improvement with bed mobility, transfers, and use of RW to advance a few steps to bedside chair without LOB. She still has weakness and requires assist for all care and mobility. Discussed this with pt and pt's daughter over the phone. Family feels comfortable with helping pt with all needs. Hospital bed to be delivered today.    If plan is discharge home, recommend the following: A lot of help with walking and/or transfers;A lot of help with bathing/dressing/bathroom;Assistance with cooking/housework;Supervision due to cognitive status;Help with stairs or ramp for entrance;Assist for transportation   Can travel by private vehicle     No  Equipment Recommendations  Wheelchair (measurements PT);Wheelchair cushion (measurements PT);Hospital bed;BSC/3in1    Recommendations for Other Services       Precautions / Restrictions Precautions Precautions: Fall Recall of Precautions/Restrictions: Impaired Restrictions Weight Bearing Restrictions Per Provider Order: No     Mobility  Bed Mobility Overal bed mobility: Needs Assistance Bed Mobility: Supine to Sit     Supine to sit: Min assist, Mod assist, HOB elevated, Used rails     General bed mobility comments:  (Heavy use of bed rails and increased time to transfer to EOB with assist for LE management)    Transfers Overall transfer level: Needs assistance Equipment used:  Rolling walker (2 wheels) Transfers: Sit to/from Stand Sit to Stand: Mod assist, From elevated surface           General transfer comment: cues for inititation and hand placement as pt tries to pull on RW; assist for lift off from elevated bed    Ambulation/Gait               General Gait Details:  (No true gait, however pt able to stand with RW at bedside and take a few steps to bedside chair without LOB.)   Stairs             Wheelchair Mobility     Tilt Bed    Modified Rankin (Stroke Patients Only)       Balance Overall balance assessment: Needs assistance Sitting-balance support: Feet supported Sitting balance-Leahy Scale: Fair Sitting balance - Comments:  (Prolonged sitting EOB with Supervision)   Standing balance support: Bilateral upper extremity supported, Reliant on assistive device for balance, During functional activity Standing balance-Leahy Scale: Poor Standing balance comment:  (High fall risk due to weakness from prolonged hospital stay)                            Communication Communication Communication: Impaired Factors Affecting Communication: Reduced clarity of speech  Cognition Arousal: Alert Behavior During Therapy: WFL for tasks assessed/performed   PT - Cognitive impairments: No family/caregiver present to determine baseline, Safety/Judgement, Awareness, Memory, Sequencing, Initiation Difficult to assess due to: Impaired communication                     PT - Cognition Comments: moaning  out occasionally Following commands: Impaired Following commands impaired: Follows one step commands with increased time    Cueing Cueing Techniques: Verbal cues, Gestural cues, Tactile cues, Visual cues  Exercises Other Exercises Other Exercises:  (Spoke with pt's daughter over the phone who states family is comfortable with providing pt with all needs in order to return home. Hospital bed to be delivered today.)     General Comments General comments (skin integrity, edema, etc.):  (Pt educated on importance of demonstrating increased functional mobility in order to return home at d/c instead of recommended STR.)      Pertinent Vitals/Pain Pain Assessment Pain Assessment: Faces Faces Pain Scale: Hurts little more Pain Location: abdomen Pain Descriptors / Indicators: Sore Pain Intervention(s): Monitored during session    Home Living                          Prior Function            PT Goals (current goals can now be found in the care plan section) Acute Rehab PT Goals Patient Stated Goal: to go back home    Frequency    Min 2X/week      PT Plan      Co-evaluation              AM-PAC PT 6 Clicks Mobility   Outcome Measure  Help needed turning from your back to your side while in a flat bed without using bedrails?: A Lot Help needed moving from lying on your back to sitting on the side of a flat bed without using bedrails?: Total Help needed moving to and from a bed to a chair (including a wheelchair)?: A Lot Help needed standing up from a chair using your arms (e.g., wheelchair or bedside chair)?: A Lot Help needed to walk in hospital room?: Total Help needed climbing 3-5 steps with a railing? : Total 6 Click Score: 9    End of Session Equipment Utilized During Treatment: Gait belt Activity Tolerance: Patient tolerated treatment well Patient left: in chair;with call bell/phone within reach;with nursing/sitter in room Nurse Communication: Mobility status PT Visit Diagnosis: Muscle weakness (generalized) (M62.81);Unsteadiness on feet (R26.81);Other abnormalities of gait and mobility (R26.89);Difficulty in walking, not elsewhere classified (R26.2)     Time: 8577-8553 PT Time Calculation (min) (ACUTE ONLY): 24 min  Charges:    $Therapeutic Activity: 23-37 mins PT General Charges $$ ACUTE PT VISIT: 1 Visit                    Darice Bohr, PTA  Darice JAYSON Bohr 06/26/2024, 4:30 PM

## 2024-06-26 NOTE — Plan of Care (Signed)
  Problem: Clinical Measurements: Goal: Ability to maintain clinical measurements within normal limits will improve Outcome: Progressing   Problem: Respiratory: Goal: Ability to maintain a clear airway and adequate ventilation will improve Outcome: Progressing   Problem: Coping: Goal: Level of anxiety will decrease Outcome: Progressing   Problem: Elimination: Goal: Will not experience complications related to bowel motility Outcome: Progressing   Problem: Pain Managment: Goal: General experience of comfort will improve and/or be controlled Outcome: Progressing   Problem: Safety: Goal: Ability to remain free from injury will improve Outcome: Progressing

## 2024-06-26 NOTE — Progress Notes (Addendum)
 Progress Note   Patient: [name] DOB: 1951-01-04 DOA: 06/15/2024     11 DOS: the patient was seen and examined on 06/26/2024   Brief hospital course: [name] 73 year old female who presented with cough, and hematemesis.  She was in acute hypoxic respiratory failure secondary to suspected aspiration pneumonia in the setting of upper GI bleed.  She required intubation and was admitted to the ICU.  On 11/25 she underwent EGD which revealed Mallory-Weiss tear without active bleeding.  She was successfully extubated on 11/27.  She remained on pressors until 11/29.  ICU stay further complicated by A-fib with RVR and decompensated heart failure.  She was started on amiodarone  drip.  Cardiology was consulted.  On 12/1 she was transferred to the TRH service. 12/1: Patient alert and oriented x 3.  Participated with speech therapy eval.  Started on diet 12/2: Doing well, working with PT/OT. SNF was recommended but patient declines. She has very strong family support at home and transport to follow up appointments.  12/3: discharge with home health delayed due to needing hospital bed and patient more lethargic today. Has needed to be placed back on 2Lnc. She is likely aspirating continuously. Will recheck tomorrow if able to dc home.   Assessment and Plan: Aspiration pneumonia Acute hypoxic respiratory failure-s/p intubation. weaned to room air. And fluctuates back to 2Lnc - Secondary to aspiration pneumonia and upper GI bleed - Extubated 11/26 - Continue DuoNebs - Tapering Solu-Medrol , now prednisone  - Status post 7 days Unasyn  - Continue aspiration precautions   Septic shock, resolved - Required pressors while in the ICU.  Blood pressure stable now   Paroxysmal A-fib with RVR - Initiated on amiodarone  drip in ICU, appears she is not on anything for maintenance now.  Currently rate controlled - History of A-fib, not on Eliquis  at home due to expense - Eliquis  not  resumed during this admission due to active GI bleed   Acute on chronic diastolic heart failure Pulmonary hypertension - proBNP 6725 - Echocardiogram 06/16/2024: LVEF 60 to 65%, grade 2 diastolic dysfunction, moderately elevated pulmonary arterial systolic pressure, tricuspid regurg with right atrial pressure of 15 mmHg - As needed Lasix .  - Will need close outpatient follow-up with cardiology   Acute upper GI bleed Acute blood loss anemia Mallory-Weiss tear - Mallory-Weiss tear seen on EGD 11/25. - Initial CTA GI bleed negative - Continue to monitor hemoglobin, transfuse if less than 7   AKI - Baseline creatinine 0.9, peak at 1.22 this admission - Improved to baseline now - BMP am   Acute metabolic encephalopathy, resolved - Secondary to septic shock and prolonged ICU stay - Head CT 11/24 without acute abnormality - Repeat head CT 11/29 no acute intracranial abnormality - Patient is alert and oriented x 4 today.  Anticipate she would do much better with speech therapy eval he can likely start diet.  Have reached back out to SLP for reevaluation - Frequent reorientation, Delirium precautions   Hypernatremia, resolved - Sodium 150 on 12/1.  Lasix  discontinued.  Unasyn  finished. - Resolved now.   Body mass index is 52.53 kg/m. Obesity Class III - Outpatient follow up for lifestyle modification and risk factor management   Abnormal TSH - TSH 6.380, T4 and T3 WNL - Can follow-up outpatient with PCP to repeat these labs when she is not acutely ill.   DVT prophylaxis: SCDs   Code Status: Full Code Disposition: Medically ready to DC. Now awaiting hh acceptance.  Consultants:  [facility]    Procedures:  Intubation, extubation, pressors EGD  Subjective:   Patient seen and examined at bedside this morning Denies nausea vomiting abdominal pain chest pain cough Currently requiring 2.5 L of oxygen She tells me she will not go to rehab  Physical  Exam:  General exam: Appears calm and comfortable, NAD, appears older than stated age. Lethargic.  Respiratory system: No increased work of breathing, rhonchorous upper airway sounds. No wheezing. Dry cough during encounter Cardiovascular system: S1 & S2 heard, RRR. Significant LE edema bilaterally but daughter at bedside states that this is her baseline swelling amount Neuro: Alert and oriented. Extremities: Symmetric, expected ROM Skin: No rashes, lesions Psychiatry: Demonstrates appropriate judgement and insight. Mood & affect appropriate for situation.   Vitals:   06/25/24 2046 06/26/24 0404 06/26/24 0845 06/26/24 1114  BP: (!) 161/58 (!) 149/64 128/78 (!) 149/61  Pulse: 77 67 81 82  Resp: 15 18 19 19   Temp: 98.3 F (36.8 C) 97.7 F (36.5 C) 98.2 F (36.8 C) 97.9 F (36.6 C)  TempSrc: Oral Oral Oral   SpO2: 97% 97% 97% 97%  Weight:  127.1 kg    Height:        Data Reviewed:    Latest Ref Rng & Units 06/26/2024   12:27 PM 06/22/2024    5:00 AM 06/21/2024    3:52 AM  CBC  WBC 4.0 - 10.5 K/uL 9.5  9.2  9.2   Hemoglobin 12.0 - 15.0 g/dL 87.3  89.2  89.1   Hematocrit 36.0 - 46.0 % 37.1  34.5  34.6   Platelets 150 - 400 K/uL 93  110  105        Latest Ref Rng & Units 06/26/2024   12:27 PM 06/24/2024    8:00 AM 06/23/2024    5:00 AM  BMP  Glucose 70 - 99 mg/dL 99  89  94   BUN 8 - 23 mg/dL 16  32  46   Creatinine 0.44 - 1.00 mg/dL 9.27  9.32  9.05   Sodium 135 - 145 mmol/L 138  141  145   Potassium 3.5 - 5.1 mmol/L 3.6  3.9  3.9   Chloride 98 - 111 mmol/L 91  101  102   CO2 22 - 32 mmol/L 34  33  33   Calcium 8.9 - 10.3 mg/dL 9.3  9.0  9.3     Disposition: Medically stable pending SNF  Author: [name], MD 06/26/2024 2:57 PM  For on call review [email or web address].

## 2024-06-26 NOTE — TOC Progression Note (Addendum)
 Transition of Care [facility]) - Progression Note    Patient Details  Name: [name] MRN: [id number] Date of Birth: 10/22/1950  [facility]) CM/SW Contact  Dalia GORMAN Fuse, RN Phone Number: 06/26/2024, 10:29 AM  Clinical Narrative:    TOC spoke with the patient's daughter and confirmed referrals for Hospital Bed and 3 in 1 [facility] were sent to [facility] yesterday. She had questions about O2, TOC advised that RA sats were 94% when taken two days ago. TOC will speak with the MD to see if home O2 is needed. [facility] has accepted the patient for [facility] PT/OT/RN/Aide.  TOC to continue to follow.                      Expected Discharge Plan and Services         Expected Discharge Date: 06/24/24                                     Social Drivers of Health (SDOH) Interventions SDOH Screenings   Food Insecurity: No Food Insecurity (04/12/2022)  Housing: Low Risk  (04/12/2022)  Transportation Needs: No Transportation Needs (04/12/2022)  Utilities: Not At Risk (04/12/2022)  Alcohol Screen: Low Risk  (04/12/2022)  Depression (PHQ2-9): Low Risk  (04/12/2022)  Financial Resource Strain: Low Risk  (04/12/2022)  Physical Activity: Inactive (04/12/2022)  Social Connections: Socially Isolated (04/12/2022)  Stress: No Stress Concern Present (04/12/2022)  Tobacco Use: Low Risk  (06/15/2024)    Readmission Risk Interventions     No data to display

## 2024-06-27 ENCOUNTER — Inpatient Hospital Stay

## 2024-06-27 DIAGNOSIS — K2971 Gastritis, unspecified, with bleeding: Secondary | ICD-10-CM | POA: Diagnosis not present

## 2024-06-27 LAB — BASIC METABOLIC PANEL WITH GFR
Anion gap: 14 (ref 5–15)
BUN: 21 mg/dL (ref 8–23)
CO2: 33 mmol/L — ABNORMAL HIGH (ref 22–32)
Calcium: 9.4 mg/dL (ref 8.9–10.3)
Chloride: 92 mmol/L — ABNORMAL LOW (ref 98–111)
Creatinine, Ser: 0.84 mg/dL (ref 0.44–1.00)
GFR, Estimated: >60 mL/min (ref >60)
Glucose, Bld: 85 mg/dL (ref 70–99)
Potassium: 3.3 mmol/L — ABNORMAL LOW (ref 3.5–5.1)
Sodium: 139 mmol/L (ref 135–145)

## 2024-06-27 LAB — CBC WITH DIFFERENTIAL/PLATELET
Abs Immature Granulocytes: 0.68 K/uL — ABNORMAL HIGH (ref 0.00–0.07)
Basophils Absolute: 0 K/uL (ref 0.0–0.1)
Basophils Relative: 0 %
Eosinophils Absolute: 0 K/uL (ref 0.0–0.5)
Eosinophils Relative: 0 %
HCT: 37.5 % (ref 36.0–46.0)
Hemoglobin: 12.7 g/dL (ref 12.0–15.0)
Immature Granulocytes: 6 %
Lymphocytes Relative: 9 %
Lymphs Abs: 1.1 K/uL (ref 0.7–4.0)
MCH: 31 pg (ref 26.0–34.0)
MCHC: 33.9 g/dL (ref 30.0–36.0)
MCV: 91.5 fL (ref 80.0–100.0)
Monocytes Absolute: 2.8 K/uL — ABNORMAL HIGH (ref 0.1–1.0)
Monocytes Relative: 23 %
Neutro Abs: 7.6 K/uL (ref 1.7–7.7)
Neutrophils Relative %: 62 %
Platelets: 107 K/uL — ABNORMAL LOW (ref 150–400)
RBC: 4.1 MIL/uL (ref 3.87–5.11)
RDW: 14.1 % (ref 11.5–15.5)
WBC: 12.2 K/uL — ABNORMAL HIGH (ref 4.0–10.5)
nRBC: 0 % (ref 0.0–0.2)

## 2024-06-27 LAB — GLUCOSE, CAPILLARY
Glucose-Capillary: 81 mg/dL (ref 70–99)
Glucose-Capillary: 100 mg/dL — ABNORMAL HIGH (ref 70–99)
Glucose-Capillary: 122 mg/dL — ABNORMAL HIGH (ref 70–99)
Glucose-Capillary: 81 mg/dL (ref 70–99)
Glucose-Capillary: 68 mg/dL — ABNORMAL LOW (ref 70–99)
Glucose-Capillary: 83 mg/dL (ref 70–99)

## 2024-06-27 MED ORDER — ONDANSETRON HCL 4 MG/2ML IJ SOLN
4.0000 mg | Freq: Four times a day (QID) | INTRAMUSCULAR | Status: DC | PRN
  Administered 2024-06-27 (×2): 4 mg via INTRAVENOUS
  Filled 2024-06-27 (×2): qty 2

## 2024-06-27 MED ORDER — PANTOPRAZOLE SODIUM 40 MG PO TBEC
40.0000 mg | DELAYED_RELEASE_TABLET | Freq: Two times a day (BID) | ORAL | Status: DC
  Administered 2024-06-27 – 2024-06-29 (×4): 40 mg via ORAL
  Filled 2024-06-27 (×4): qty 1

## 2024-06-27 MED ORDER — POTASSIUM CHLORIDE CRYS ER 20 MEQ PO TBCR
40.0000 meq | EXTENDED_RELEASE_TABLET | Freq: Once | ORAL | Status: AC
  Administered 2024-06-27: 40 meq via ORAL
  Filled 2024-06-27: qty 2

## 2024-06-27 MED ORDER — PROCHLORPERAZINE EDISYLATE 10 MG/2ML IJ SOLN: 5.0000 mg | INTRAMUSCULAR | Status: DC | PRN

## 2024-06-27 MED ORDER — HYDROMORPHONE HCL 1 MG/ML IJ SOLN
1.0000 mg | Freq: Once | INTRAMUSCULAR | Status: AC
  Administered 2024-06-27: 1 mg via INTRAVENOUS
  Filled 2024-06-27: qty 1

## 2024-06-27 MED ORDER — ZIPRASIDONE MESYLATE 20 MG IM SOLR
10.0000 mg | Freq: Once | INTRAMUSCULAR | Status: AC
  Administered 2024-06-27: 10 mg via INTRAMUSCULAR
  Filled 2024-06-27: qty 20

## 2024-06-27 NOTE — Significant Event (Signed)
 Rapid Response Event Note   Reason for Call: Called RRT for AMS vs possible syncopal/vagal event while on toilet   Initial Focused Assessment: pt back to bed, moans, but becoming more responsive with time, see chart for VS      Interventions: Dr Dorinda at bedside, ordered Stat head CT   Plan of Care: as above, RN Joesph to call for further assistance.    Event Summary:   MD Notified: [id number] Call Time:1320 Arrival Time:1322 End Upfz:8664  Claudett Bayly A, RN

## 2024-06-27 NOTE — TOC Progression Note (Addendum)
 Transition of Care [facility]) - Progression Note    Patient Details  Name: [name] MRN: [id number] Date of Birth: 13-Dec-1950  [facility]) CM/SW Contact  Kayleann Mccaffery L Meika Earll, [place] Phone Number: 06/27/2024, 4:27 PM  Clinical Narrative:     Bariatric BSC was ordered through [facility]. They will make arrangements with patient and/or family to retrieve the [facility] patient already received.   5:05pm: Message received from [employer]  Since the patient does not meet the weight requirements, the patient would need to pay an upgrade fee. $50.00                     Expected Discharge Plan and Services         Expected Discharge Date: 06/24/24                                     Social Drivers of Health (SDOH) Interventions SDOH Screenings   Food Insecurity: No Food Insecurity (04/12/2022)  Housing: Low Risk  (04/12/2022)  Transportation Needs: No Transportation Needs (04/12/2022)  Utilities: Not At Risk (04/12/2022)  Alcohol Screen: Low Risk  (04/12/2022)  Depression (PHQ2-9): Low Risk  (04/12/2022)  Financial Resource Strain: Low Risk  (04/12/2022)  Physical Activity: Inactive (04/12/2022)  Social Connections: Socially Isolated (04/12/2022)  Stress: No Stress Concern Present (04/12/2022)  Tobacco Use: Low Risk  (06/15/2024)    Readmission Risk Interventions     No data to display

## 2024-06-27 NOTE — Plan of Care (Signed)
 Patient ambulated to the [facility] during my shift and had a syncopal episode and a rapid was called. Patient has loss of appetite and nausea as well. MD aware. Family was present.    Problem: Health Behavior/Discharge Planning: Goal: Ability to manage health-related needs will improve Outcome: Not Progressing   Problem: Activity: Goal: Risk for activity intolerance will decrease Outcome: Not Progressing   Problem: Nutrition: Goal: Adequate nutrition will be maintained Outcome: Not Progressing   Problem: Coping: Goal: Level of anxiety will decrease Outcome: Not Progressing   Problem: Pain Managment: Goal: General experience of comfort will improve and/or be controlled Outcome: Not Progressing   Problem: Safety: Goal: Ability to remain free from injury will improve Outcome: Not Progressing   Problem: Elimination: Goal: Will not experience complications related to bowel motility Outcome: Progressing   Problem: Skin Integrity: Goal: Risk for impaired skin integrity will decrease Outcome: Progressing

## 2024-06-27 NOTE — Progress Notes (Signed)
 Progress Note   Patient: [name] FMW:[id number] DOB: [birth year] DOA: 06/15/2024     12 DOS: the patient was seen and examined on 06/27/2024    Brief hospital course: CHIA MOWERS 73 year old female who presented with cough, and hematemesis.  She was in acute hypoxic respiratory failure secondary to suspected aspiration pneumonia in the setting of upper GI bleed.  She required intubation and was admitted to the ICU.  On 11/25 she underwent EGD which revealed Mallory-Weiss tear without active bleeding.  She was successfully extubated on 11/27.  She remained on pressors until 11/29.  ICU stay further complicated by A-fib with RVR and decompensated heart failure.  She was started on amiodarone  drip.  Cardiology was consulted.  On 12/1 she was transferred to the [facility] service. 12/1: Patient alert and oriented x 3.  Participated with speech therapy eval. currently pending rehab.    Assessment and Plan: Aspiration pneumonia Acute hypoxic respiratory failure-s/p intubation. weaned to room air. And fluctuates back to 2Lnc - Secondary to aspiration pneumonia and upper GI bleed - Extubated 11/26 - Continue DuoNebs Completed steroid course - Status post 7 days Unasyn  - Continue aspiration precautions PT OT recommended SNF however patient wants to go home  Near syncopal episode Patient had near syncopal episode today while on the bedside commode Monitor orthostatic vital    Septic shock, resolved - Required pressors while in the ICU.  Blood pressure stable now   Paroxysmal A-fib with RVR - Initiated on amiodarone  drip in ICU, appears she is not on anything for maintenance now.  Currently rate controlled - History of A-fib, not on Eliquis  at home due to expense - Eliquis  not resumed during this admission due to active GI bleed   Acute on chronic diastolic heart failure Pulmonary hypertension - proBNP 6725 - Echocardiogram 06/16/2024: LVEF 60 to 65%, grade 2 diastolic dysfunction,  moderately elevated pulmonary arterial systolic pressure, tricuspid regurg with right atrial pressure of 15 mmHg - As needed Lasix .  - Will need close outpatient follow-up with cardiology   Acute upper GI bleed Acute blood loss anemia Mallory-Weiss tear - Mallory-Weiss tear seen on EGD 11/25. - Initial CTA GI bleed negative - Continue to monitor hemoglobin, transfuse if less than 7   AKI-improved - Baseline creatinine 0.9, peak at 1.22 this admission - Improved to baseline now - Monitor renal function   Acute metabolic encephalopathy, resolved - Secondary to septic shock and prolonged ICU stay - Head CT 11/24 without acute abnormality - Repeat head CT 11/29 no acute intracranial abnormality - Frequent reorientation, Delirium precautions   Hypernatremia, resolved - Sodium 150 on 12/1.  Lasix  discontinued.  Unasyn  finished. - Resolved now.   Body mass index is 52.53 kg/m. Obesity Class III - Outpatient follow up for lifestyle modification and risk factor management   Abnormal TSH - TSH 6.380, T4 and T3 WNL - Can follow-up outpatient with PCP to repeat these labs when she is not acutely ill.   DVT prophylaxis: SCDs   Code Status: Full Code Disposition: Awaiting rehab   Consultants:  [facility]    Procedures:  Intubation, extubation, pressors EGD   Subjective:  Patient seen and examined at bedside this morning Denies nausea vomiting Patient still insistent on going home rather than rehab She did have an episode of vasovagal near syncope this afternoon. Patient was not able to tolerate CT scan of the brain as she kept moving. Patient's daughter at bedside and updated  Physical Exam:  General  exam: Appears calm and comfortable, NAD, appears older than stated age. Lethargic.  Respiratory system: No increased work of breathing, rhonchorous upper airway sounds. No wheezing. Dry cough during encounter Cardiovascular system: S1 & S2 heard, RRR. Significant LE  edema bilaterally but daughter at bedside states that this is her baseline swelling amount Neuro: Alert and oriented. Extremities: Symmetric, expected ROM Skin: No rashes, lesions Psychiatry: Demonstrates appropriate judgement and insight. Mood & affect appropriate for situation.      Vitals:   06/27/24 0500 06/27/24 0755 06/27/24 1326 06/27/24 1506  BP:  (!) 127/59 (!) 129/112 (!) 129/92  Pulse:  89 (!) 107 100  Resp:  18    Temp:  98.9 F (37.2 C)  97.8 F (36.6 C)  TempSrc:  Oral  Oral  SpO2:  92% 98% 98%  Weight: 121.9 kg     Height:          Latest Ref Rng & Units 06/27/2024    6:48 AM 06/26/2024   12:27 PM 06/22/2024    5:00 AM  CBC  WBC 4.0 - 10.5 K/uL 12.2  9.5  9.2   Hemoglobin 12.0 - 15.0 g/dL 87.2  87.3  89.2   Hematocrit 36.0 - 46.0 % 37.5  37.1  34.5   Platelets 150 - 400 K/uL 107  93  110        Latest Ref Rng & Units 06/27/2024    6:48 AM 06/26/2024   12:27 PM 06/24/2024    8:00 AM  BMP  Glucose 70 - 99 mg/dL 85  99  89   BUN 8 - 23 mg/dL 21  16  32   Creatinine 0.44 - 1.00 mg/dL 9.15  9.27  9.32   Sodium 135 - 145 mmol/L 139  138  141   Potassium 3.5 - 5.1 mmol/L 3.3  3.6  3.9   Chloride 98 - 111 mmol/L 92  91  101   CO2 22 - 32 mmol/L 33  34  33   Calcium 8.9 - 10.3 mg/dL 9.4  9.3  9.0      Author: [name], MD 06/27/2024 4:37 PM  For on call review [email or web address].

## 2024-06-27 NOTE — Plan of Care (Signed)
 Problem: Activity: Goal: Ability to tolerate increased activity will improve Outcome: Completed/Met   Problem: Respiratory: Goal: Ability to maintain a clear airway and adequate ventilation will improve Outcome: Completed/Met   Problem: Role Relationship: Goal: Method of communication will improve Outcome: Completed/Met  -------------------------------------------------------------------------------  Problem: Education: Goal: Knowledge of General Education information will improve Description: Including pain rating scale, medication(s)/side effects and non-pharmacologic comfort measures Outcome: Progressing   Problem: Health Behavior/Discharge Planning: Goal: Ability to manage health-related needs will improve Outcome: Progressing   Problem: Clinical Measurements: Goal: Ability to maintain clinical measurements within normal limits will improve Outcome: Progressing Goal: Will remain free from infection Outcome: Progressing Goal: Diagnostic test results will improve Outcome: Progressing Goal: Respiratory complications will improve Outcome: Progressing Goal: Cardiovascular complication will be avoided Outcome: Progressing   Problem: Activity: Goal: Risk for activity intolerance will decrease Outcome: Progressing   Problem: Nutrition: Goal: Adequate nutrition will be maintained Outcome: Progressing   Problem: Coping: Goal: Level of anxiety will decrease Outcome: Progressing   Problem: Elimination: Goal: Will not experience complications related to bowel motility Outcome: Progressing Goal: Will not experience complications related to urinary retention Outcome: Progressing   Problem: Pain Managment: Goal: General experience of comfort will improve and/or be controlled Outcome: Progressing   Problem: Safety: Goal: Ability to remain free from injury will improve Outcome: Progressing   Problem: Skin Integrity: Goal: Risk for impaired skin integrity will  decrease Outcome: Progressing   Problem: Education: Goal: Ability to identify signs and symptoms of gastrointestinal bleeding will improve Outcome: Progressing   Problem: Bowel/Gastric: Goal: Will show no signs and symptoms of gastrointestinal bleeding Outcome: Progressing   Problem: Fluid Volume: Goal: Will show no signs and symptoms of excessive bleeding Outcome: Progressing   Problem: Clinical Measurements: Goal: Complications related to the disease process, condition or treatment will be avoided or minimized Outcome: Progressing   Problem: Education: Goal: Ability to demonstrate management of disease process will improve Outcome: Progressing Goal: Ability to verbalize understanding of medication therapies will improve Outcome: Progressing Goal: Individualized Educational Video(s) Outcome: Progressing   Problem: Activity: Goal: Capacity to carry out activities will improve Outcome: Progressing   Problem: Cardiac: Goal: Ability to achieve and maintain adequate cardiopulmonary perfusion will improve Outcome: Progressing   Problem: Education: Goal: Knowledge of disease or condition will improve Outcome: Progressing Goal: Understanding of medication regimen will improve Outcome: Progressing Goal: Individualized Educational Video(s) Outcome: Progressing   Problem: Activity: Goal: Ability to tolerate increased activity will improve Outcome: Progressing   Problem: Cardiac: Goal: Ability to achieve and maintain adequate cardiopulmonary perfusion will improve Outcome: Progressing   Problem: Health Behavior/Discharge Planning: Goal: Ability to safely manage health-related needs after discharge will improve Outcome: Progressing   Problem: Education: Goal: Ability to describe self-care measures that may prevent or decrease complications (Diabetes Survival Skills Education) will improve Outcome: Progressing Goal: Individualized Educational Video(s) Outcome:  Progressing   Problem: Coping: Goal: Ability to adjust to condition or change in health will improve Outcome: Progressing   Problem: Fluid Volume: Goal: Ability to maintain a balanced intake and output will improve Outcome: Progressing   Problem: Health Behavior/Discharge Planning: Goal: Ability to identify and utilize available resources and services will improve Outcome: Progressing Goal: Ability to manage health-related needs will improve Outcome: Progressing   Problem: Metabolic: Goal: Ability to maintain appropriate glucose levels will improve Outcome: Progressing   Problem: Nutritional: Goal: Maintenance of adequate nutrition will improve Outcome: Progressing Goal: Progress toward achieving an optimal weight will improve Outcome: Progressing  Problem: Skin Integrity: Goal: Risk for impaired skin integrity will decrease Outcome: Progressing

## 2024-06-28 DIAGNOSIS — K922 Gastrointestinal hemorrhage, unspecified: Secondary | ICD-10-CM | POA: Diagnosis not present

## 2024-06-28 LAB — CBC WITH DIFFERENTIAL/PLATELET
Abs Immature Granulocytes: 0.36 K/uL — ABNORMAL HIGH (ref 0.00–0.07)
Basophils Absolute: 0 K/uL (ref 0.0–0.1)
Basophils Relative: 0 %
Eosinophils Absolute: 0 K/uL (ref 0.0–0.5)
Eosinophils Relative: 0 %
HCT: 37.1 % (ref 36.0–46.0)
Hemoglobin: 12.5 g/dL (ref 12.0–15.0)
Immature Granulocytes: 3 %
Lymphocytes Relative: 10 %
Lymphs Abs: 1.1 K/uL (ref 0.7–4.0)
MCH: 30.8 pg (ref 26.0–34.0)
MCHC: 33.7 g/dL (ref 30.0–36.0)
MCV: 91.4 fL (ref 80.0–100.0)
Monocytes Absolute: 2.8 K/uL — ABNORMAL HIGH (ref 0.1–1.0)
Monocytes Relative: 26 %
Neutro Abs: 6.4 K/uL (ref 1.7–7.7)
Neutrophils Relative %: 61 %
Platelets: 111 K/uL — ABNORMAL LOW (ref 150–400)
RBC: 4.06 MIL/uL (ref 3.87–5.11)
RDW: 14.1 % (ref 11.5–15.5)
WBC: 10.6 K/uL — ABNORMAL HIGH (ref 4.0–10.5)
nRBC: 0 % (ref 0.0–0.2)

## 2024-06-28 LAB — BASIC METABOLIC PANEL WITH GFR
Anion gap: 13 (ref 5–15)
BUN: 27 mg/dL — ABNORMAL HIGH (ref 8–23)
CO2: 34 mmol/L — ABNORMAL HIGH (ref 22–32)
Calcium: 9.2 mg/dL (ref 8.9–10.3)
Chloride: 89 mmol/L — ABNORMAL LOW (ref 98–111)
Creatinine, Ser: 1.08 mg/dL — ABNORMAL HIGH (ref 0.44–1.00)
GFR, Estimated: 54 mL/min — ABNORMAL LOW (ref >60)
Glucose, Bld: 90 mg/dL (ref 70–99)
Potassium: 3.2 mmol/L — ABNORMAL LOW (ref 3.5–5.1)
Sodium: 135 mmol/L (ref 135–145)

## 2024-06-28 LAB — GLUCOSE, CAPILLARY
Glucose-Capillary: 99 mg/dL (ref 70–99)
Glucose-Capillary: 164 mg/dL — ABNORMAL HIGH (ref 70–99)
Glucose-Capillary: 96 mg/dL (ref 70–99)
Glucose-Capillary: 119 mg/dL — ABNORMAL HIGH (ref 70–99)

## 2024-06-28 MED ORDER — MIRTAZAPINE 15 MG PO TABS
15.0000 mg | ORAL_TABLET | Freq: Every day | ORAL | Status: DC
  Administered 2024-06-28: 15 mg via ORAL
  Filled 2024-06-28: qty 1

## 2024-06-28 NOTE — Plan of Care (Signed)
  Problem: Education: Goal: Knowledge of General Education information will improve Description: Including pain rating scale, medication(s)/side effects and non-pharmacologic comfort measures Outcome: Progressing   Problem: Health Behavior/Discharge Planning: Goal: Ability to manage health-related needs will improve Outcome: Progressing   Problem: Clinical Measurements: Goal: Ability to maintain clinical measurements within normal limits will improve Outcome: Progressing Goal: Will remain free from infection Outcome: Progressing Goal: Diagnostic test results will improve Outcome: Progressing Goal: Respiratory complications will improve Outcome: Progressing Goal: Cardiovascular complication will be avoided Outcome: Progressing   Problem: Activity: Goal: Risk for activity intolerance will decrease Outcome: Progressing   Problem: Nutrition: Goal: Adequate nutrition will be maintained Outcome: Progressing   Problem: Coping: Goal: Level of anxiety will decrease Outcome: Progressing   Problem: Elimination: Goal: Will not experience complications related to bowel motility Outcome: Progressing Goal: Will not experience complications related to urinary retention Outcome: Progressing   Problem: Pain Managment: Goal: General experience of comfort will improve and/or be controlled Outcome: Progressing   Problem: Safety: Goal: Ability to remain free from injury will improve Outcome: Progressing   Problem: Skin Integrity: Goal: Risk for impaired skin integrity will decrease Outcome: Progressing   Problem: Education: Goal: Ability to identify signs and symptoms of gastrointestinal bleeding will improve Outcome: Progressing   Problem: Bowel/Gastric: Goal: Will show no signs and symptoms of gastrointestinal bleeding Outcome: Progressing   Problem: Fluid Volume: Goal: Will show no signs and symptoms of excessive bleeding Outcome: Progressing   Problem: Clinical  Measurements: Goal: Complications related to the disease process, condition or treatment will be avoided or minimized Outcome: Progressing   Problem: Education: Goal: Ability to demonstrate management of disease process will improve Outcome: Progressing Goal: Ability to verbalize understanding of medication therapies will improve Outcome: Progressing Goal: Individualized Educational Video(s) Outcome: Progressing   Problem: Cardiac: Goal: Ability to achieve and maintain adequate cardiopulmonary perfusion will improve Outcome: Progressing   Problem: Activity: Goal: Capacity to carry out activities will improve Outcome: Progressing   Problem: Education: Goal: Knowledge of disease or condition will improve Outcome: Progressing Goal: Understanding of medication regimen will improve Outcome: Progressing Goal: Individualized Educational Video(s) Outcome: Progressing   Problem: Activity: Goal: Ability to tolerate increased activity will improve Outcome: Progressing   Problem: Cardiac: Goal: Ability to achieve and maintain adequate cardiopulmonary perfusion will improve Outcome: Progressing   Problem: Health Behavior/Discharge Planning: Goal: Ability to safely manage health-related needs after discharge will improve Outcome: Progressing   Problem: Education: Goal: Ability to describe self-care measures that may prevent or decrease complications (Diabetes Survival Skills Education) will improve Outcome: Progressing Goal: Individualized Educational Video(s) Outcome: Progressing   Problem: Coping: Goal: Ability to adjust to condition or change in health will improve Outcome: Progressing   Problem: Fluid Volume: Goal: Ability to maintain a balanced intake and output will improve Outcome: Progressing   Problem: Health Behavior/Discharge Planning: Goal: Ability to identify and utilize available resources and services will improve Outcome: Progressing Goal: Ability to manage  health-related needs will improve Outcome: Progressing   Problem: Metabolic: Goal: Ability to maintain appropriate glucose levels will improve Outcome: Progressing   Problem: Nutritional: Goal: Maintenance of adequate nutrition will improve Outcome: Progressing Goal: Progress toward achieving an optimal weight will improve Outcome: Progressing   Problem: Skin Integrity: Goal: Risk for impaired skin integrity will decrease Outcome: Progressing

## 2024-06-28 NOTE — Plan of Care (Signed)
  Problem: Education: Goal: Knowledge of General Education information will improve Description: Including pain rating scale, medication(s)/side effects and non-pharmacologic comfort measures Outcome: Progressing   Problem: Health Behavior/Discharge Planning: Goal: Ability to manage health-related needs will improve Outcome: Progressing   Problem: Clinical Measurements: Goal: Ability to maintain clinical measurements within normal limits will improve Outcome: Progressing Goal: Will remain free from infection Outcome: Progressing Goal: Diagnostic test results will improve Outcome: Progressing Goal: Respiratory complications will improve Outcome: Progressing Goal: Cardiovascular complication will be avoided Outcome: Progressing   Problem: Activity: Goal: Risk for activity intolerance will decrease Outcome: Progressing   Problem: Nutrition: Goal: Adequate nutrition will be maintained Outcome: Progressing   Problem: Coping: Goal: Level of anxiety will decrease Outcome: Progressing   Problem: Elimination: Goal: Will not experience complications related to bowel motility Outcome: Progressing Goal: Will not experience complications related to urinary retention Outcome: Progressing   Problem: Pain Managment: Goal: General experience of comfort will improve and/or be controlled Outcome: Progressing   Problem: Safety: Goal: Ability to remain free from injury will improve Outcome: Progressing   Problem: Skin Integrity: Goal: Risk for impaired skin integrity will decrease Outcome: Progressing   Problem: Education: Goal: Ability to identify signs and symptoms of gastrointestinal bleeding will improve Outcome: Progressing   Problem: Bowel/Gastric: Goal: Will show no signs and symptoms of gastrointestinal bleeding Outcome: Progressing   Problem: Fluid Volume: Goal: Will show no signs and symptoms of excessive bleeding Outcome: Progressing   Problem: Clinical  Measurements: Goal: Complications related to the disease process, condition or treatment will be avoided or minimized Outcome: Progressing   Problem: Education: Goal: Ability to demonstrate management of disease process will improve Outcome: Progressing Goal: Ability to verbalize understanding of medication therapies will improve Outcome: Progressing Goal: Individualized Educational Video(s) Outcome: Progressing   Problem: Activity: Goal: Capacity to carry out activities will improve Outcome: Progressing   Problem: Cardiac: Goal: Ability to achieve and maintain adequate cardiopulmonary perfusion will improve Outcome: Progressing   Problem: Education: Goal: Knowledge of disease or condition will improve Outcome: Progressing Goal: Understanding of medication regimen will improve Outcome: Progressing Goal: Individualized Educational Video(s) Outcome: Progressing   Problem: Activity: Goal: Ability to tolerate increased activity will improve Outcome: Progressing   Problem: Cardiac: Goal: Ability to achieve and maintain adequate cardiopulmonary perfusion will improve Outcome: Progressing   Problem: Health Behavior/Discharge Planning: Goal: Ability to safely manage health-related needs after discharge will improve Outcome: Progressing   Problem: Education: Goal: Ability to describe self-care measures that may prevent or decrease complications (Diabetes Survival Skills Education) will improve Outcome: Progressing Goal: Individualized Educational Video(s) Outcome: Progressing   Problem: Coping: Goal: Ability to adjust to condition or change in health will improve Outcome: Progressing   Problem: Fluid Volume: Goal: Ability to maintain a balanced intake and output will improve Outcome: Progressing   Problem: Health Behavior/Discharge Planning: Goal: Ability to identify and utilize available resources and services will improve Outcome: Progressing Goal: Ability to manage  health-related needs will improve Outcome: Progressing   Problem: Metabolic: Goal: Ability to maintain appropriate glucose levels will improve Outcome: Progressing   Problem: Nutritional: Goal: Maintenance of adequate nutrition will improve Outcome: Progressing Goal: Progress toward achieving an optimal weight will improve Outcome: Progressing   Problem: Skin Integrity: Goal: Risk for impaired skin integrity will decrease Outcome: Progressing   Problem: Tissue Perfusion: Goal: Adequacy of tissue perfusion will improve Outcome: Progressing

## 2024-06-28 NOTE — Progress Notes (Signed)
   06/28/24 1200  Spiritual Encounters  Type of Visit Initial  Care provided to: Pt and family  Referral source Patient request  Reason for visit Advance directives  OnCall Visit Yes   Chaplain provided and explained AD paperwork and procedure. Family knows to have Chaplain services called to provide notary and witnesses to complete HCPOA.

## 2024-06-28 NOTE — Progress Notes (Signed)
 PROGRESS NOTE    CHENAE BRAGER  FMW:[id number] DOB: 04-11-1951 DOA: 06/15/2024 PCP: Odell Tor Edra [name], MD    Brief Narrative:   Orlean DELENA Daring 73 year old female who presented with cough, and hematemesis.  She was in acute hypoxic respiratory failure secondary to suspected aspiration pneumonia in the setting of upper GI bleed.  She required intubation and was admitted to the ICU.  On 11/25 she underwent EGD which revealed Mallory-Weiss tear without active bleeding.  She was successfully extubated on 11/27.  She remained on pressors until 11/29.  ICU stay further complicated by A-fib with RVR and decompensated heart failure.  She was started on amiodarone  drip.  Cardiology was consulted.  On 12/1 she was transferred to the [facility] service. 12/1: Patient alert and oriented x 3.  Participated with speech therapy eval. currently pending rehab.   Assessment & Plan:   Principal Problem:   GIB (gastrointestinal bleeding) Active Problems:   Septic shock (HCC)   Hematemesis   Mallory-Weiss tear  Aspiration pneumonia Acute hypoxic respiratory failure-s/p intubation. weaned to room air. And fluctuates back to 2Lnc - Secondary to aspiration pneumonia and upper GI bleed - Extubated 11/26 - Continue DuoNebs Completed steroid course - Status post 7 days Unasyn  Plan: Aspiration precautions Dietary modifications    Near syncopal episode Near syncopal event rapid response called on 12/6 Labs indicate intravascular volume depletion and contraction alkalosis Plan: Discontinue IV diuretic Recheck labs in a.m. Continue therapy evaluations     Septic shock, resolved No shock physiology   Paroxysmal A-fib with RVR - Initiated on amiodarone  drip in ICU, appears she is not on anything for maintenance now.  Currently rate controlled - History of A-fib, not on Eliquis  at home due to expense - Eliquis  not resumed during this admission due to active GI bleed   Acute on chronic diastolic  heart failure Pulmonary hypertension - proBNP 6725 - Echocardiogram 06/16/2024: LVEF 60 to 65%, grade 2 diastolic dysfunction, moderately elevated pulmonary arterial systolic pressure, tricuspid regurg with right atrial pressure of 15 mmHg - As needed Lasix .  - Will need close outpatient follow-up with cardiology   Acute upper GI bleed Acute blood loss anemia Mallory-Weiss tear - Mallory-Weiss tear seen on EGD 11/25. - Initial CTA GI bleed negative - Continue to monitor hemoglobin, transfuse if less than 7   AKI-improved - Baseline creatinine 0.9, peak at 1.22 this admission - Improved to baseline now - Monitor renal function   Acute metabolic encephalopathy, resolved - Secondary to septic shock and prolonged ICU stay - Head CT 11/24 without acute abnormality - Repeat head CT 11/29 no acute intracranial abnormality - Frequent reorientation, Delirium precautions   Hypernatremia, resolved - Back in reference range   Body mass index is 52.53 kg/m. Obesity Class III - Outpatient follow up for lifestyle modification and risk factor management   Abnormal TSH - TSH 6.380, T4 and T3 WNL - Can follow-up outpatient with PCP to repeat these labs when she is not acutely ill.    DVT prophylaxis: SCDs Code Status: Full Family Communication: Daughter at bedside 12/7 Disposition Plan: Status is: Inpatient Remains inpatient appropriate because: Presyncope.  Unsafe discharge at this time.  Discharge in 24 hours.   Level of care: Telemetry  Consultants:  None  Procedures:  None  Antimicrobials: None   Subjective: Seen and examined.  Resting in bed.  Appears fatigued otherwise stable.  Objective: Vitals:   06/27/24 2000 06/28/24 0509 06/28/24 0645 06/28/24 0743  BP: 133/70 ROLLEN)  111/47  (!) 119/40  Pulse: 91 87  98  Resp: 16 16  18   Temp: 97.8 F (36.6 C) 98.2 F (36.8 C)  98.3 F (36.8 C)  TempSrc:    Oral  SpO2: 98% 93%  93%  Weight:   120.5 kg   Height:         Intake/Output Summary (Last 24 hours) at 06/28/2024 1217 Last data filed at 06/27/2024 2200 Gross per 24 hour  Intake 370 ml  Output 325 ml  Net 45 ml   Filed Weights   06/26/24 0404 06/27/24 0500 06/28/24 0645  Weight: 127.1 kg 121.9 kg 120.5 kg    Examination:  General exam: Fatigued Respiratory system: Clear to auscultation. Respiratory effort normal. Cardiovascular system: 1 S2, RRR, no murmurs, no pedal edema Gastrointestinal system: Soft, NT/ND, normal bowel sounds Central nervous system: Alert and oriented. No focal neurological deficits. Extremities: Decreased power bilateral lower extremities Skin: No rashes, lesions or ulcers Psychiatry: Judgement and insight appear normal. Mood & affect appropriate.     Data Reviewed: I have personally reviewed following labs and imaging studies  CBC: Recent Labs  Lab 06/22/24 0500 06/26/24 1227 06/27/24 0648 06/28/24 0612  WBC 9.2 9.5 12.2* 10.6*  NEUTROABS  --   --  7.6 6.4  HGB 10.7* 12.6 12.7 12.5  HCT 34.5* 37.1 37.5 37.1  MCV 97.7 89.8 91.5 91.4  PLT 110* 93* 107* 111*   Basic Metabolic Panel: Recent Labs  Lab 06/22/24 0500 06/23/24 0500 06/24/24 0800 06/26/24 1227 06/27/24 0648 06/28/24 0612  NA 150* 145 141 138 139 135  K 3.6 3.9 3.9 3.6 3.3* 3.2*  CL 105 102 101 91* 92* 89*  CO2 33* 33* 33* 34* 33* 34*  GLUCOSE 113* 94 89 99 85 90  BUN 50* 46* 32* 16 21 27*  CREATININE 1.12* 0.94 0.67 0.72 0.84 1.08*  CALCIUM 9.5 9.3 9.0 9.3 9.4 9.2  MG 3.2*  --   --   --   --   --    GFR: Estimated Creatinine Clearance: 59.2 mL/min (A) (by C-G formula based on SCr of 1.08 mg/dL (H)). Liver Function Tests: No results for input(s): AST, ALT, ALKPHOS, BILITOT, PROT, ALBUMIN in the last 168 hours. No results for input(s): LIPASE, AMYLASE in the last 168 hours. No results for input(s): AMMONIA in the last 168 hours. Coagulation Profile: No results for input(s): INR, PROTIME in the last 168  hours. Cardiac Enzymes: No results for input(s): CKTOTAL, CKMB, CKMBINDEX, TROPONINI in the last 168 hours. BNP (last 3 results) Recent Labs    06/16/24 0736 06/20/24 2205  PROBNP 5,289.0* 6,725.0*   HbA1C: No results for input(s): HGBA1C in the last 72 hours. CBG: Recent Labs  Lab 06/27/24 1719 06/27/24 2159 06/27/24 2222 06/28/24 0743 06/28/24 1214  GLUCAP 81 68* 83 99 164*   Lipid Profile: No results for input(s): CHOL, HDL, LDLCALC, TRIG, CHOLHDL, LDLDIRECT in the last 72 hours. Thyroid  Function Tests: No results for input(s): TSH, T4TOTAL, FREET4, T3FREE, THYROIDAB in the last 72 hours. Anemia Panel: No results for input(s): VITAMINB12, FOLATE, FERRITIN, TIBC, IRON, RETICCTPCT in the last 72 hours. Sepsis Labs: No results for input(s): PROCALCITON, LATICACIDVEN in the last 168 hours.  No results found for this or any previous visit (from the past 240 hours).       Radiology Studies: DG Abd 1 View Result Date: 06/27/2024 CLINICAL DATA:  Abdominal pain EXAM: DG ABDOMEN 1V COMPARISON:  06/15/2024 FINDINGS: 2 supine frontal views of  the abdomen and pelvis are obtained. Unremarkable bowel gas pattern without evidence of obstruction or ileus. Minimal stool within the rectosigmoid colon. There is a 4.2 cm radiopaque structure overlying the lower pelvis, likely on or beneath the patient. No masses or abnormal calcifications. Lung bases are clear. IMPRESSION: 1. Unremarkable bowel gas pattern. 2. 4.2 cm radiopaque structure overlying the lower pelvis, likely on or beneath the patient. Electronically Signed   By: Ozell Daring M.D.   On: 06/27/2024 17:11        Scheduled Meds:  Chlorhexidine  Gluconate Cloth  6 each Topical Daily   feeding supplement  237 mL Oral BID BM   insulin  aspart  0-9 Units Subcutaneous TID WC   multivitamin with minerals  1 tablet Oral Daily   pantoprazole   40 mg Oral BID   sodium chloride  flush   10-40 mL Intracatheter Q12H   Continuous Infusions:   LOS: 13 days     Calvin KATHEE Robson, MD [facility]   If 7PM-7AM, please contact night-coverage  06/28/2024, 12:17 PM

## 2024-06-29 ENCOUNTER — Other Ambulatory Visit: Payer: Self-pay

## 2024-06-29 ENCOUNTER — Emergency Department
Admission: EM | Admit: 2024-06-29 | Discharge: 2024-06-29 | Discharge status: Against Medical Advice | Attending: Emergency Medicine | Admitting: Emergency Medicine

## 2024-06-29 ENCOUNTER — Emergency Department

## 2024-06-29 DIAGNOSIS — K922 Gastrointestinal hemorrhage, unspecified: Secondary | ICD-10-CM | POA: Diagnosis not present

## 2024-06-29 LAB — BASIC METABOLIC PANEL WITH GFR
Anion gap: 13 (ref 5–15)
BUN: 23 mg/dL (ref 8–23)
CO2: 32 mmol/L (ref 22–32)
Calcium: 9.4 mg/dL (ref 8.9–10.3)
Chloride: 93 mmol/L — ABNORMAL LOW (ref 98–111)
Creatinine, Ser: 0.92 mg/dL (ref 0.44–1.00)
GFR, Estimated: >60 mL/min (ref >60)
Glucose, Bld: 104 mg/dL — ABNORMAL HIGH (ref 70–99)
Potassium: 3.1 mmol/L — ABNORMAL LOW (ref 3.5–5.1)
Sodium: 138 mmol/L (ref 135–145)

## 2024-06-29 LAB — GLUCOSE, CAPILLARY
Glucose-Capillary: 113 mg/dL — ABNORMAL HIGH (ref 70–99)
Glucose-Capillary: 94 mg/dL (ref 70–99)
Glucose-Capillary: 94 mg/dL (ref 70–99)

## 2024-06-29 MED ORDER — MIRTAZAPINE 15 MG PO TABS: 15.0000 mg | ORAL_TABLET | Freq: Every day | ORAL | 0 refills | Status: AC

## 2024-06-29 MED ORDER — POTASSIUM CHLORIDE CRYS ER 20 MEQ PO TBCR
40.0000 meq | EXTENDED_RELEASE_TABLET | Freq: Once | ORAL | Status: AC
  Administered 2024-06-29: 40 meq via ORAL
  Filled 2024-06-29: qty 2

## 2024-06-29 NOTE — Progress Notes (Addendum)
 Occupational Therapy Treatment Patient Details Name: [name] MRN: [id number] DOB: 11/12/1950 Today's Date: 06/29/2024   History of present illness Patient is a 73 year old female with acute hypoxic respiratory failure s/t suspected aspiration pneumonia in the setting of suspected acute upper GIB requiring vent support. Extubated 11/27. PMH: migraines, atrial fibrillation.   OT comments  Pt is supine in bed on arrival. Pleasant and agreeable to OT/PT co-treatment session. She denies pain. Daughter present for session to ensure she can provide level of assist needed to safely return home. Pt performed bed mobility with Min to SBA with increased time and cues. She required Min A x1 for STS from EOB and stand pivot from bed<>BSC during session using RW with increased time and rocking to reach standing. 2 person assist present for safety as pt has required 2 person assist prior to this session. Attempted orthostatic vitals, however were not accurate d/t pt's poor standing tolerance and need for BM during session. Orthostatic VS were taken  BP- Lying BP- Sitting BP- Standing at 0 minutes briefly with return to sitting EOB BP standing at [facility]  06/29/24 1448 (!) 133/94 145/84 115/78  140/89  Pt denies any dizziness during session, HR variable from a-fib, however mostly from mid 90s to low 100s and sp02 stable on RA. She was able to take steps from [facility] to [facility] using RW with Min/CGA x1. Daughter and family are able to provide level of assist needed for pt to safely return home with DME including hospital bed, BSC, RW, and wheelchair. Notified MD. Pt left with all needs in place and will cont to require skilled acute OT services to maximize her safety and IND to return to PLOF.       If plan is discharge home, recommend the following:  A little help with walking and/or transfers;A lot of help with bathing/dressing/bathroom;Assistance with cooking/housework;Assist for transportation;Help with stairs or  ramp for entrance   Equipment Recommendations  Hospital bed;BSC/3in1;Wheelchair (measurements OT);Other (comment) (RW)    Recommendations for Other Services      Precautions / Restrictions Precautions Precautions: Fall Recall of Precautions/Restrictions: Impaired Restrictions Weight Bearing Restrictions Per Provider Order: No       Mobility Bed Mobility Overal bed mobility: Needs Assistance Bed Mobility: Supine to Sit     Supine to sit: Min assist, HOB elevated, Used rails Sit to supine: Supervision   General bed mobility comments: min A via HHA to bring trunk upright and able to return self to supine with increased time    Transfers Overall transfer level: Needs assistance Equipment used: Rolling walker (2 wheels) Transfers: Sit to/from Stand Sit to Stand: Min assist     Step pivot transfers: Min assist, +2 safety/equipment, From elevated surface     General transfer comment: stood from EOB and attempted orthostatic vitals, unable to get accurate reading d/t poor standing tolerance and need for BM, +2 present during session for safety but pt only requiring Min A x1     Balance Overall balance assessment: Needs assistance Sitting-balance support: Feet supported Sitting balance-Leahy Scale: Good     Standing balance support: Bilateral upper extremity supported, Reliant on assistive device for balance, During functional activity Standing balance-Leahy Scale: Fair Standing balance comment: RW and x1 external support for safety                           ADL either performed or assessed with clinical judgement   ADL  Overall ADL's : Needs assistance/impaired                         Toilet Transfer: Minimal assistance;Rolling walker (2 wheels);Stand-pivot;BSC/3in1 Statistician Details (indicate cue type and reason): RW use and Min/CGA with cues for safety                Extremity/Trunk Assessment              Vision        Perception     Praxis     Communication Communication Communication: Impaired Factors Affecting Communication: Reduced clarity of speech   Cognition Arousal: Alert Behavior During Therapy: WFL for tasks assessed/performed                                 Following commands: Impaired Following commands impaired: Follows one step commands with increased time      Cueing   Cueing Techniques: Verbal cues, Gestural cues, Tactile cues, Visual cues  Exercises      Shoulder Instructions       General Comments      Pertinent Vitals/ Pain       Pain Assessment Pain Assessment: No/denies pain Pain Intervention(s): Monitored during session  Home Living                                          Prior Functioning/Environment              Frequency  Min 2X/week        Progress Toward Goals  OT Goals(current goals can now be found in the care plan section)  Progress towards OT goals: Progressing toward goals  Acute Rehab OT Goals Patient Stated Goal: go home OT Goal Formulation: With patient/family Time For Goal Achievement: 07/03/24 Potential to Achieve Goals: Fair  Plan      Co-evaluation    PT/OT/SLP Co-Evaluation/Treatment: Yes Reason for Co-Treatment: For patient/therapist safety;Complexity of the patient's impairments (multi-system involvement);To address functional/ADL transfers PT goals addressed during session: Mobility/safety with mobility;Balance OT goals addressed during session: ADL's and self-care      AM-PAC OT 6 Clicks Daily Activity     Outcome Measure   Help from another person eating meals?: A Little Help from another person taking care of personal grooming?: A Little Help from another person toileting, which includes using toliet, bedpan, or urinal?: A Lot Help from another person bathing (including washing, rinsing, drying)?: A Lot Help from another person to put on and taking off regular upper body  clothing?: A Little Help from another person to put on and taking off regular lower body clothing?: A Lot 6 Click Score: 15    End of Session Equipment Utilized During Treatment: Rolling walker (2 wheels)  OT Visit Diagnosis: Other abnormalities of gait and mobility (R26.89);Muscle weakness (generalized) (M62.81)   Activity Tolerance Patient tolerated treatment well   Patient Left with call bell/phone within reach;in bed;with bed alarm set;with family/visitor present   Nurse Communication Mobility status        Time: 8665-8587 OT Time Calculation (min): 38 min  Charges: OT General Charges $OT Visit: 1 Visit OT Treatments $Self Care/Home Management : 8-22 mins  Shanaye Rief Chrismon, OTR/L  06/29/24, 2:59 PM   Despina Boan E Chrismon 06/29/2024, 2:54 PM

## 2024-06-29 NOTE — ED Notes (Signed)
 Pt refused labs.

## 2024-06-29 NOTE — ED Notes (Signed)
 Per US  pt refusing US  at this time

## 2024-06-29 NOTE — Progress Notes (Signed)
 PROGRESS NOTE    [name]  FMW:[id number] DOB: 1951-06-19 DOA: 06/15/2024 PCP: Odell Tor Edra [name], MD    Brief Narrative:   [name] 73 year old female who presented with cough, and hematemesis.  She was in acute hypoxic respiratory failure secondary to suspected aspiration pneumonia in the setting of upper GI bleed.  She required intubation and was admitted to the ICU.  On 11/25 she underwent EGD which revealed Mallory-Weiss tear without active bleeding.  She was successfully extubated on 11/27.  She remained on pressors until 11/29.  ICU stay further complicated by A-fib with RVR and decompensated heart failure.  She was started on amiodarone  drip.  Cardiology was consulted.  On 12/1 she was transferred to the TRH service.  12/1: Patient alert and oriented x 3.  Participated with speech therapy eval. currently pending rehab.  12/8: Patient is overall hemodynamically stable but mobility is severely limited.  Remains a two-person assist.  She continues to decline skilled nursing facility placement and wishes to go home.  PT to engage with patient's family and reevaluate for skilled nursing facility   Assessment & Plan:   Principal Problem:   GIB (gastrointestinal bleeding) Active Problems:   Septic shock (HCC)   Hematemesis   Mallory-Weiss tear  Aspiration pneumonia Acute hypoxic respiratory failure-s/p intubation. weaned to room air. And fluctuates back to 2Lnc - Secondary to aspiration pneumonia and upper GI bleed - Extubated 11/26 - Continue DuoNebs Completed steroid course - Status post 7 days Unasyn  Plan: Aspiration precautions Dietary modifications    Near syncopal episode Near syncopal event rapid response called on 12/6 Labs indicate intravascular volume depletion and contraction alkalosis Plan: Hold diuretic Labs improving Repeat therapy evaluations     Septic shock, resolved No shock physiology   Paroxysmal A-fib with RVR - Initiated on  amiodarone  drip in ICU, appears she is not on anything for maintenance now.  Currently rate controlled - History of A-fib, not on Eliquis  at home due to expense - Eliquis  not resumed during this admission due to active GI bleed   Acute on chronic diastolic heart failure Pulmonary hypertension - proBNP 6725 - Echocardiogram 06/16/2024: LVEF 60 to 65%, grade 2 diastolic dysfunction, moderately elevated pulmonary arterial systolic pressure, tricuspid regurg with right atrial pressure of 15 mmHg - As needed Lasix .  - Will need close outpatient follow-up with cardiology   Acute upper GI bleed Acute blood loss anemia Mallory-Weiss tear - Mallory-Weiss tear seen on EGD 11/25. - Initial CTA GI bleed negative - Continue to monitor hemoglobin, transfuse if less than 7   AKI-improved - Baseline creatinine 0.9, peak at 1.22 this admission - Improved to baseline now - Monitor renal function   Acute metabolic encephalopathy, resolved - Secondary to septic shock and prolonged ICU stay - Head CT 11/24 without acute abnormality - Repeat head CT 11/29 no acute intracranial abnormality - Frequent reorientation, Delirium precautions   Hypernatremia, resolved - Back in reference range   Body mass index is 52.53 kg/m. Obesity Class III - Outpatient follow up for lifestyle modification and risk factor management   Abnormal TSH - TSH 6.380, T4 and T3 WNL - Can follow-up outpatient with PCP to repeat these labs when she is not acutely ill.    DVT prophylaxis: SCDs Code Status: Full Family Communication: Daughter at bedside 12/7 Disposition Plan: Status is: Inpatient Remains inpatient appropriate because: Presyncope.  Unsafe discharge at this time.  Discharge in 24 hours.   Level of care: Telemetry  Consultants:  None  Procedures:  None  Antimicrobials: None   Subjective: Seen and examined.  Resting in bed.  Appears fatigued otherwise stable.  Objective: Vitals:   06/28/24  1600 06/28/24 2047 06/29/24 0459 06/29/24 0904  BP: (!) 127/47 (!) 114/59 (!) 140/40 (!) 116/56  Pulse: 82 61 92 79  Resp: 18 16  18   Temp: 98.1 F (36.7 C) 98.2 F (36.8 C) 97.7 F (36.5 C) 98.9 F (37.2 C)  TempSrc: Oral Oral    SpO2: 95% 94% 93% 94%  Weight:      Height:        Intake/Output Summary (Last 24 hours) at 06/29/2024 1328 Last data filed at 06/28/2024 1900 Gross per 24 hour  Intake 180 ml  Output --  Net 180 ml   Filed Weights   06/26/24 0404 06/27/24 0500 06/28/24 0645  Weight: 127.1 kg 121.9 kg 120.5 kg    Examination:  General exam: Appears fatigued Respiratory system: Clear to auscultation. Respiratory effort normal. Cardiovascular system: 1 S2, RRR, no murmurs, no pedal edema Gastrointestinal system: Soft, NT/ND, normal bowel sounds Central nervous system: Alert and oriented. No focal neurological deficits. Extremities: Decreased power bilateral lower extremities Skin: No rashes, lesions or ulcers Psychiatry: Judgement and insight appear normal. Mood & affect appropriate.     Data Reviewed: I have personally reviewed following labs and imaging studies  CBC: Recent Labs  Lab 06/26/24 1227 06/27/24 0648 06/28/24 0612  WBC 9.5 12.2* 10.6*  NEUTROABS  --  7.6 6.4  HGB 12.6 12.7 12.5  HCT 37.1 37.5 37.1  MCV 89.8 91.5 91.4  PLT 93* 107* 111*   Basic Metabolic Panel: Recent Labs  Lab 06/24/24 0800 06/26/24 1227 06/27/24 0648 06/28/24 0612 06/29/24 0641  NA 141 138 139 135 138  K 3.9 3.6 3.3* 3.2* 3.1*  CL 101 91* 92* 89* 93*  CO2 33* 34* 33* 34* 32  GLUCOSE 89 99 85 90 104*  BUN 32* 16 21 27* 23  CREATININE 0.67 0.72 0.84 1.08* 0.92  CALCIUM 9.0 9.3 9.4 9.2 9.4   GFR: Estimated Creatinine Clearance: 69.5 mL/min (by C-G formula based on SCr of 0.92 mg/dL). Liver Function Tests: No results for input(s): AST, ALT, ALKPHOS, BILITOT, PROT, ALBUMIN in the last 168 hours. No results for input(s): LIPASE, AMYLASE in the  last 168 hours. No results for input(s): AMMONIA in the last 168 hours. Coagulation Profile: No results for input(s): INR, PROTIME in the last 168 hours. Cardiac Enzymes: No results for input(s): CKTOTAL, CKMB, CKMBINDEX, TROPONINI in the last 168 hours. BNP (last 3 results) Recent Labs    06/16/24 0736 06/20/24 2205  PROBNP 5,289.0* 6,725.0*   HbA1C: No results for input(s): HGBA1C in the last 72 hours. CBG: Recent Labs  Lab 06/28/24 1644 06/28/24 2104 06/29/24 0906 06/29/24 1229 06/29/24 1304  GLUCAP 96 119* 113* 94 94   Lipid Profile: No results for input(s): CHOL, HDL, LDLCALC, TRIG, CHOLHDL, LDLDIRECT in the last 72 hours. Thyroid  Function Tests: No results for input(s): TSH, T4TOTAL, FREET4, T3FREE, THYROIDAB in the last 72 hours. Anemia Panel: No results for input(s): VITAMINB12, FOLATE, FERRITIN, TIBC, IRON, RETICCTPCT in the last 72 hours. Sepsis Labs: No results for input(s): PROCALCITON, LATICACIDVEN in the last 168 hours.  No results found for this or any previous visit (from the past 240 hours).       Radiology Studies: DG Abd 1 View Result Date: 06/27/2024 CLINICAL DATA:  Abdominal pain EXAM: DG ABDOMEN 1V COMPARISON:  06/15/2024  FINDINGS: 2 supine frontal views of the abdomen and pelvis are obtained. Unremarkable bowel gas pattern without evidence of obstruction or ileus. Minimal stool within the rectosigmoid colon. There is a 4.2 cm radiopaque structure overlying the lower pelvis, likely on or beneath the patient. No masses or abnormal calcifications. Lung bases are clear. IMPRESSION: 1. Unremarkable bowel gas pattern. 2. 4.2 cm radiopaque structure overlying the lower pelvis, likely on or beneath the patient. Electronically Signed   By: Ozell Porter M.D.   On: 06/27/2024 17:11        Scheduled Meds:  Chlorhexidine  Gluconate Cloth  6 each Topical Daily   feeding supplement  237 mL Oral BID BM    insulin  aspart  0-9 Units Subcutaneous TID WC   mirtazapine   15 mg Oral QHS   multivitamin with minerals  1 tablet Oral Daily   pantoprazole   40 mg Oral BID   sodium chloride  flush  10-40 mL Intracatheter Q12H   Continuous Infusions:   LOS: 14 days     Sue KATHEE Robson, MD [facility]   If 7PM-7AM, please contact night-coverage  06/29/2024, 1:28 PM

## 2024-06-29 NOTE — ED Triage Notes (Signed)
 Pt to ED via EMS from home, pt reports right lower leg pain that began as soon as she was d/c from hospital today. Pt was admitted to be treated for pneumonia. Pt reports she takes a blood thinner.

## 2024-06-29 NOTE — TOC Transition Note (Signed)
 Transition of Care [facility]) - Discharge Note   Patient Details  Name: [name] MRN: [id number] Date of Birth: Jul 10, 1951  Transition of Care [facility]) CM/SW Contact:  Dalia GORMAN Fuse, RN Phone Number: 06/29/2024, 4:00 PM   Clinical Narrative:     Patient discharged to home with [facility] PT/OT/RN/Aide. Patient's daughter Trish to care for the patient at home. [employer] delivered DME previously. Windy requested a wheelchair today. Referral sent to [employer] for home delivery.  Final next level of care: Home w Home Health Services Barriers to Discharge: Barriers Resolved   Patient Goals and CMS Choice            Discharge Placement                       Discharge Plan and Services Additional resources added to the After Visit Summary for                  DME Arranged: Hospital bed, Bedside commode, Wheelchair manual DME Agency: [facility] Date DME Agency Contacted: 06/29/24 Time DME Agency Contacted: [phone]   HH Arranged: PT, OT, RN, Nurse's Aide HH Agency: [facility] Date HH Agency Contacted: 06/29/24 Time HH Agency Contacted: [phone]    Social Drivers of Health (SDOH) Interventions SDOH Screenings   Food Insecurity: No Food Insecurity (04/12/2022)  Housing: Low Risk  (04/12/2022)  Transportation Needs: No Transportation Needs (04/12/2022)  Utilities: Not At Risk (04/12/2022)  Alcohol Screen: Low Risk  (04/12/2022)  Depression (PHQ2-9): Low Risk  (04/12/2022)  Financial Resource Strain: Low Risk  (04/12/2022)  Physical Activity: Inactive (04/12/2022)  Social Connections: Socially Isolated (04/12/2022)  Stress: No Stress Concern Present (04/12/2022)  Tobacco Use: Low Risk  (06/15/2024)     Readmission Risk Interventions     No data to display

## 2024-06-29 NOTE — Discharge Summary (Signed)
 Physician Discharge Summary  [name] FMW:[id number] DOB: [birth year] DOA: 06/15/2024  PCP: Odell Tor Edra [name], MD  Admit date: 06/15/2024 Discharge date: 06/29/2024  Admitted From: Home Disposition:  Home w home health  Recommendations for Outpatient Follow-up:  Follow up with PCP in 1-2 weeks Follow-up outpatient cardiology 1 to 2 weeks  Home Health: Yes PT OT RN aide SLP Equipment/Devices: Wheelchair, bedside commode  Discharge Condition: Stable CODE STATUS: Full Diet recommendation: Dysphagia 3  Brief/Interim Summary:  [name] 73 year old female who presented with cough, and hematemesis.  She was in acute hypoxic respiratory failure secondary to suspected aspiration pneumonia in the setting of upper GI bleed.  She required intubation and was admitted to the ICU.  On 11/25 she underwent EGD which revealed Mallory-Weiss tear without active bleeding.  She was successfully extubated on 11/27.  She remained on pressors until 11/29.  ICU stay further complicated by A-fib with RVR and decompensated heart failure.  She was started on amiodarone  drip.  Cardiology was consulted.  On 12/1 she was transferred to the [facility] service.   12/1: Patient alert and oriented x 3.  Participated with speech therapy eval. currently pending rehab.   12/8: Patient is overall hemodynamically stable but mobility is severely limited.  Remains a two-person assist.  She continues to decline skilled nursing facility placement and wishes to go home.  PT to engage with patient's family and reevaluate for skilled nursing facility  Patient was reevaluated by physical therapy.  Daughter was engaged in conversation as well.  Decision made to proceed home with home health services.      Discharge Diagnoses:  Principal Problem:   GIB (gastrointestinal bleeding) Active Problems:   Septic shock (HCC)   Hematemesis   Mallory-Weiss tear Aspiration pneumonia Acute hypoxic respiratory failure-s/p  intubation. weaned to room air. And fluctuates back to 2Lnc - Secondary to aspiration pneumonia and upper GI bleed - Extubated 11/26 - Continue DuoNebs Completed steroid course - Status post 7 days Unasyn  Plan: On room air at time of discharge Stable for DC.  Follow-up outpatient PCP     Near syncopal episode Near syncopal event rapid response called on 12/6 Labs indicate intravascular volume depletion and contraction alkalosis Plan: Hold diuretic on DC Follow-up outpatient PCP and cardiology     Septic shock, resolved No shock physiology   Paroxysmal A-fib with RVR - Initiated on amiodarone  drip in ICU, appears she is not on anything for maintenance now.  Currently rate controlled.  Metoprolol  documented but does not appear patient was taking.  Will hold on discharge and follow-up outpatient PCP and cardiology   Acute on chronic diastolic heart failure Pulmonary hypertension - proBNP 6725 - Echocardiogram 06/16/2024: LVEF 60 to 65%, grade 2 diastolic dysfunction, moderately elevated pulmonary arterial systolic pressure, tricuspid regurg with right atrial pressure of 15 mmHg - Will need close follow-up outpatient cardiology hemoglobin   Acute upper GI bleed Acute blood loss anemia Mallory-Weiss tear - Mallory-Weiss tear seen on EGD 11/25. - Initial CTA GI bleed negative - Table at time of discharge.  No anticoagulation recommended at this time   AKI-improved - Baseline creatinine 0.9, peak at 1.22 this admission - Improved to baseline now - Monitor renal function as outpatient   Acute metabolic encephalopathy, resolved - Secondary to septic shock and prolonged ICU stay - Head CT 11/24 without acute abnormality - Repeat head CT 11/29 no acute intracranial abnormality    Hypernatremia, resolved - Back in reference range  Body mass index is 52.53 kg/m. Obesity Class III - Outpatient follow up for lifestyle modification and risk factor management   Abnormal  TSH - TSH 6.380, T4 and T3 WNL - Can follow-up outpatient with PCP to repeat these labs when she is not acutely ill.   Discharge Instructions  Discharge Instructions     Diet - low sodium heart healthy   Complete by: As directed    Increase activity slowly   Complete by: As directed       Allergies as of 06/29/2024   No Known Allergies      Medication List     STOP taking these medications    apixaban  5 MG Tabs tablet Commonly known as: ELIQUIS    gabapentin  600 MG tablet Commonly known as: NEURONTIN    metoprolol  succinate 100 MG 24 hr tablet Commonly known as: TOPROL -XL   torsemide  20 MG tablet Commonly known as: DEMADEX        TAKE these medications    albuterol  108 (90 Base) MCG/ACT inhaler Commonly known as: VENTOLIN  HFA Inhale 1-2 puffs into the lungs every 6 (six) hours as needed for wheezing or shortness of breath.   mirtazapine  15 MG tablet Commonly known as: REMERON  Take 1 tablet (15 mg total) by mouth at bedtime.   multivitamin with minerals Tabs tablet Take 1 tablet by mouth daily.   ondansetron  4 MG tablet Commonly known as: Zofran  Take 1 tablet (4 mg total) by mouth every 8 (eight) hours as needed for nausea or vomiting.   optichamber diamond Misc by Does not apply route.   pantoprazole  40 MG tablet Commonly known as: Protonix  Take 1 tablet (40 mg total) by mouth daily.   pregabalin 50 MG capsule Commonly known as: LYRICA Take 1 capsule by mouth 2 (two) times daily.   TYLENOL  8 HOUR PO Take by mouth as needed.               Durable Medical Equipment  (From admission, onward)           Start     Ordered   06/29/24 1417  For home use only DME standard manual wheelchair with seat cushion  Once       Comments: Patient suffers from weakness, functional decline which impairs their ability to perform daily activities like bathing, dressing, feeding, grooming, and toileting in the home.  A cane, crutch, or walker will not  resolve issue with performing activities of daily living. A wheelchair will allow patient to safely perform daily activities. Patient can safely propel the wheelchair in the home or has a caregiver who can provide assistance. Length of need Lifetime. Accessories: elevating leg rests (ELRs), wheel locks, extensions and anti-tippers.   06/29/24 1416   06/27/24 1606  For home use only DME Bedside commode  Once       Comments: BARIATRIC BSC  Question:  Patient needs a bedside commode to treat with the following condition  Answer:  Ambulatory dysfunction   06/27/24 1606   06/25/24 1240  For home use only DME Hospital bed  Once       Question Answer Comment  Length of Need 6 Months   Bed type Semi-electric      06/25/24 1239            Contact information for follow-up providers     Custovic, Sabina, DO. Go in 1 week(s).   Specialty: Cardiology Contact information: [address] [place] [address] [phone]  Odell Tor Edra [name], MD. Schedule an appointment as soon as possible for a visit in 1 week(s).   Specialty: Family Medicine Contact information: [address] [place] [address] [phone]              Contact information for after-discharge care     Home Medical Care     [facility] [place] .   Service: Home Health Services Contact information: [phone] [place]  [address] [phone]                    No Known Allergies  Consultations: Cardiology   Procedures/Studies: DG Abd 1 View Result Date: 06/27/2024 CLINICAL DATA:  Abdominal pain EXAM: DG ABDOMEN 1V COMPARISON:  06/15/2024 FINDINGS: 2 supine frontal views of the abdomen and pelvis are obtained. Unremarkable bowel gas pattern without evidence of obstruction or ileus. Minimal stool within the rectosigmoid colon. There is a 4.2 cm radiopaque structure overlying the lower pelvis, likely on or beneath the patient. No masses or  abnormal calcifications. Lung bases are clear. IMPRESSION: 1. Unremarkable bowel gas pattern. 2. 4.2 cm radiopaque structure overlying the lower pelvis, likely on or beneath the patient. Electronically Signed   By: Ozell Porter M.D.   On: 06/27/2024 17:11   CT HEAD WO CONTRAST ( ) Result Date: 06/20/2024 EXAM: CT HEAD WITHOUT 06/20/2024 11:14:15 AM TECHNIQUE: CT of the head was performed without the administration of intravenous contrast. Automated exposure control, iterative reconstruction, and/or weight based adjustment of the mA/kV was utilized to reduce the radiation dose to as low as reasonably achievable. COMPARISON: 06/15/2024 CLINICAL HISTORY: Neuro deficit, acute, stroke suspected FINDINGS: BRAIN AND VENTRICLES: No acute intracranial hemorrhage. No mass effect or midline shift. No extra-axial fluid collection. No evidence of acute infarct. No hydrocephalus. Calcified atherosclerotic plaque within cavernous/supraclinoid internal carotid arteries. ORBITS: No acute abnormality. SINUSES AND MASTOIDS: No acute abnormality. SOFT TISSUES AND SKULL: No acute skull fracture. No acute soft tissue abnormality. IMPRESSION: 1. No acute intracranial abnormality. 2. Calcified atherosclerotic plaque within cavernous/supraclinoid internal carotid arteries. Electronically signed by: Evalene Coho MD 06/20/2024 11:52 AM EST RP Workstation: [id number]   DG Chest Port 1 View Result Date: 06/17/2024 CLINICAL DATA:  5626 Acute respiratory failure (HCC) 5626 EXAM: PORTABLE CHEST - 1 VIEW COMPARISON:  June 16, 2024 FINDINGS: Endotracheal tube is similarly positioned terminating in the mid trachea above the carina. Left IJ approach central venous catheter terminates in the lower SVC. Improved aeration of the lungs with persistent left mid and lower lung zone airspace opacities. Likely trace pleural effusion on the left. The cardiac silhouette appears enlarged, unchanged. Aortic atherosclerosis. Esophagogastric  tube courses below the diaphragm with the distal tip not included in the field of view. No pneumothorax. IMPRESSION: 1. Improved aeration of both lungs with persistent airspace opacities in the left mid and lower lung zones. Likely trace left pleural effusion. 2. Similarly positioned support tubes and lines, as delineated above. Electronically Signed   By: Rogelia Myers M.D.   On: 06/17/2024 11:20   DG Chest Port 1 View Result Date: 06/16/2024 CLINICAL DATA:  Orogastric tube placement. EXAM: PORTABLE CHEST 1 VIEW COMPARISON:  Radiographs 06/16/2024 and 06/15/2024.  CT 06/15/2024. FINDINGS: 1500 hours. Two views submitted. The carina is not well visualized. Endotracheal tube appears unchanged, proximally 1.1 cm above the carina. Enteric tube projects below the diaphragm with tip overlying the mid stomach. A left IJ central venous catheter projects over the lower SVC. Lower lung volumes with increasing  diffuse bilateral airspace opacities, most consistent with worsening pulmonary edema. Possible small bilateral pleural effusions. No evidence of pneumothorax. Grossly stable heart size and mediastinal contours. There are aortic calcifications. IMPRESSION: 1. Enteric tube tip overlies the mid stomach. 2. Lower lung volumes with increasing diffuse bilateral airspace opacities, most consistent with worsening pulmonary edema. Electronically Signed   By: Elsie Perone M.D.   On: 06/16/2024 16:09   ECHOCARDIOGRAM COMPLETE Result Date: 06/16/2024    ECHOCARDIOGRAM REPORT   Patient Name:   ANISHKA BUSHARD Date of Exam: 06/16/2024 Medical Rec #:  [id number]       Height:       63.0 in Accession #:    [id number]      Weight:       305.1 lb Date of Birth:  29-Sep-1950       BSA:          2.314 m Patient Age:    73 years        BP:           107/64 mmHg Patient Gender: F               HR:           133 bpm. Exam Location:  [facility] Procedure: 2D Echo, Cardiac Doppler and Color Doppler (Both Spectral and Color            Flow  Doppler were utilized during procedure). Indications:     Cardiomyopathy-Unspecified I42.9  History:         Patient has prior history of Echocardiogram examinations, most                  recent 10/23/2021. Cardiomyopathy; Arrythmias:Atrial                  Fibrillation.  Sonographer:     Ashley McNeely-Sloane Referring Phys:  [id number] BRITTON L RUST-CHESTER Diagnosing Phys: Annalee Custovic IMPRESSIONS  1. Left ventricular ejection fraction, by estimation, is 60 to 65%. The left ventricle has normal function. The left ventricle has no regional wall motion abnormalities. Left ventricular diastolic parameters are consistent with Grade II diastolic dysfunction (pseudonormalization).  2. Right ventricular systolic function is normal. The right ventricular size is normal. There is moderately elevated pulmonary artery systolic pressure. The estimated right ventricular systolic pressure is 55.4 mmHg.  3. Left atrial size was moderately dilated.  4. Right atrial size was mildly dilated.  5. The mitral valve is normal in structure. Mild mitral valve regurgitation. No evidence of mitral stenosis.  6. Tricuspid valve regurgitation is moderate.  7. The aortic valve is normal in structure. Aortic valve regurgitation is moderate. No aortic stenosis is present.  8. The inferior vena cava is normal in size with greater than 50% respiratory variability, suggesting right atrial pressure of 3 mmHg. FINDINGS  Left Ventricle: Left ventricular ejection fraction, by estimation, is 60 to 65%. The left ventricle has normal function. The left ventricle has no regional wall motion abnormalities. The left ventricular internal cavity size was normal in size. There is  no left ventricular hypertrophy. Left ventricular diastolic parameters are consistent with Grade II diastolic dysfunction (pseudonormalization). Right Ventricle: The right ventricular size is normal. No increase in right ventricular wall thickness. Right ventricular systolic  function is normal. There is moderately elevated pulmonary artery systolic pressure. The tricuspid regurgitant velocity is 3.18 m/s, and with an assumed right atrial pressure of 15 mmHg, the estimated right ventricular systolic pressure is 55.4 mmHg. Left  Atrium: Left atrial size was moderately dilated. Right Atrium: Right atrial size was mildly dilated. Pericardium: There is no evidence of pericardial effusion. Mitral Valve: The mitral valve is normal in structure. Mild mitral valve regurgitation. No evidence of mitral valve stenosis. MV peak gradient, 11.6 mmHg. The mean mitral valve gradient is 4.0 mmHg. Tricuspid Valve: The tricuspid valve is normal in structure. Tricuspid valve regurgitation is moderate. Aortic Valve: The aortic valve is normal in structure. Aortic valve regurgitation is moderate. Aortic regurgitation PHT measures 202 msec. No aortic stenosis is present. Aortic valve mean gradient measures 4.0 mmHg. Aortic valve peak gradient measures 6.7 mmHg. Aortic valve area, by VTI measures 2.82 cm. Pulmonic Valve: The pulmonic valve was normal in structure. Pulmonic valve regurgitation is not visualized. Aorta: The aortic root is normal in size and structure. Venous: The inferior vena cava is normal in size with greater than 50% respiratory variability, suggesting right atrial pressure of 3 mmHg. IAS/Shunts: No atrial level shunt detected by color flow Doppler.  LEFT VENTRICLE PLAX 2D LVIDd:         4.50 cm     Diastology LVIDs:         3.20 cm     LV e' medial:    7.62 cm/s LV PW:         1.20 cm     LV E/e' medial:  14.4 LV IVS:        1.30 cm     LV e' lateral:   8.27 cm/s LVOT diam:     2.10 cm     LV E/e' lateral: 13.3 LV SV:         47 LV SV Index:   20 LVOT Area:     3.46 cm LV IVRT:       56 msec  LV Volumes (MOD) LV vol d, MOD A2C: 36.7 ml LV vol d, MOD A4C: 69.6 ml LV vol s, MOD A2C: 19.6 ml LV vol s, MOD A4C: 22.5 ml LV SV MOD A2C:     17.1 ml LV SV MOD A4C:     69.6 ml LV SV MOD BP:       29.3 ml RIGHT VENTRICLE             IVC RV Basal diam:  4.20 cm     IVC diam: 2.70 cm RV Mid diam:    4.20 cm RV S prime:     12.50 cm/s TAPSE (M-mode): 1.4 cm LEFT ATRIUM             Index        RIGHT ATRIUM           Index LA diam:        4.70 cm 2.03 cm/m   RA Area:     24.00 cm LA Vol (A2C):   79.1 ml 34.19 ml/m  RA Volume:   71.00 ml  30.68 ml/m LA Vol (A4C):   91.7 ml 39.63 ml/m LA Biplane Vol: 90.4 ml 39.07 ml/m  AORTIC VALVE                    PULMONIC VALVE AV Area (Vmax):    2.74 cm     PV Vmax:        0.83 m/s AV Area (Vmean):   2.76 cm     PV Vmean:       58.700 cm/s AV Area (VTI):     2.82 cm     PV  VTI:         0.121 m AV Vmax:           129.00 cm/s  PV Peak grad:   2.8 mmHg AV Vmean:          89.400 cm/s  PV Mean grad:   2.0 mmHg AV VTI:            0.166 m      RVOT Peak grad: 2 mmHg AV Peak Grad:      6.7 mmHg AV Mean Grad:      4.0 mmHg LVOT Vmax:         102.00 cm/s LVOT Vmean:        71.300 cm/s LVOT VTI:          0.135 m LVOT/AV VTI ratio: 0.81 AI PHT:            202 msec  AORTA Ao Root diam: 3.40 cm Ao Asc diam:  3.50 cm MITRAL VALVE                TRICUSPID VALVE MV Area (PHT): 4.71 cm     TR Peak grad:   40.4 mmHg MV Area VTI:   1.80 cm     TR Mean grad:   21.0 mmHg MV Peak grad:  11.6 mmHg    TR Vmax:        318.00 cm/s MV Mean grad:  4.0 mmHg     TR Vmean:       225.0 cm/s MV Vmax:       1.70 m/s MV Vmean:      90.2 cm/s    SHUNTS MV Decel Time: 161 msec     Systemic VTI:  0.14 m MV E velocity: 110.00 cm/s  Systemic Diam: 2.10 cm                             Pulmonic VTI:  0.095 m Annalee Custovic Electronically signed by Annalee Casa Signature Date/Time: 06/16/2024/3:15:43 PM    Final    DG Chest 1 View Result Date: 06/16/2024 EXAM: 1 VIEW(S) XRAY OF THE CHEST 06/16/2024 09:48:00 AM COMPARISON: 06/15/2024 CLINICAL HISTORY: Encounter for central line placement [id number] FINDINGS: LINES, TUBES AND DEVICES: Endotracheal tube in place with tip 1.3 cm above the carina. Left  internal jugular central venous catheter terminates in the region of the distal superior vena cava. Enteric tube extends into the stomach. LUNGS AND PLEURA: Mild pulmonary edema. Stable bilateral airspace opacities. Small left pleural effusion. Low lung volumes. No pneumothorax. HEART AND MEDIASTINUM: Persistent cardiomegaly. Aortic atherosclerosis. BONES AND SOFT TISSUES: No acute osseous abnormality. IMPRESSION: 1. Mild pulmonary edema and stable bilateral airspace opacities. 2. Small left pleural effusion. 3. Persistent cardiomegaly and aortic atherosclerosis. Electronically signed by: Evalene Coho MD 06/16/2024 10:13 AM EST RP Workstation: [id number]   CT CHEST WO CONTRAST Result Date: 06/15/2024 EXAM: CT CHEST WITHOUT CONTRAST 06/15/2024 11:23:23 PM TECHNIQUE: CT of the chest was performed without the administration of intravenous contrast. Multiplanar reformatted images are provided for review. Automated exposure control, iterative reconstruction, and/or weight based adjustment of the mA/kV was utilized to reduce the radiation dose to as low as reasonably achievable. COMPARISON: 05/27/2021. CLINICAL HISTORY: Aspiration. FINDINGS: MEDIASTINUM: There was a ball in the middle of the esophagus. Cardiomegaly. Coronary artery and aortic atherosclerosis. Endotracheal tube tip is in the lower trachea above the carina. Pericardium is unremarkable. LYMPH NODES: No mediastinal, hilar or axillary lymphadenopathy. LUNGS  AND PLEURA: Extensive bilateral airspace disease, most pronounced throughout the left lung and in the right lower lobe. Findings concerning for pneumonia. Trace bilateral pleural effusions. No pneumothorax. SOFT TISSUES/BONES: No acute abnormality of the bones or soft tissues. UPPER ABDOMEN: Limited images of the upper abdomen demonstrates no acute abnormality. IMPRESSION: 1. Extensive bilateral airspace disease, most pronounced throughout the left lung and in the right lower lobe, concerning for  pneumonia. 2. Trace bilateral pleural effusions. 3. Cardiomegaly, coronary artery disease . 4. Aortic atherosclerosis. Electronically signed by: Franky Crease MD 06/15/2024 11:44 PM EST RP Workstation: [id number]   CT ANGIO GI BLEED Result Date: 06/15/2024 EXAM: CTA ABDOMEN AND PELVIS WITH CONTRAST 06/15/2024 11:23:23 PM TECHNIQUE: CTA images of the abdomen and pelvis with intravenous contrast. 125 mL of iohexol  (OMNIPAQUE ) 350 MG/ML injection was administered. Three-dimensional MIP/volume rendered formations were performed. Automated exposure control, iterative reconstruction, and/or weight based adjustment of the mA/kV was utilized to reduce the radiation dose to as low as reasonably achievable. COMPARISON: Comparison is made to a study from 2012. CLINICAL HISTORY: LUQ abdominal pain; hematemesis, abd pain. FINDINGS: VASCULATURE: GI BLEED: No active extravasation of contrast within the GI tract. AORTA: Aortic atherosclerosis. No acute finding. No abdominal aortic aneurysm. No dissection. CELIAC TRUNK: No acute finding. No occlusion or significant stenosis. SUPERIOR MESENTERIC ARTERY: No acute finding. No occlusion or significant stenosis. INFERIOR MESENTERIC ARTERY: No acute finding. No occlusion or significant stenosis. RENAL ARTERIES: No acute finding. No occlusion or significant stenosis. ILIAC ARTERIES: No acute finding. No occlusion or significant stenosis. ABDOMEN/PELVIS: LOWER CHEST: See chest CT report today. LIVER: The liver is unremarkable. GALLBLADDER AND BILE DUCTS: Gallbladder is unremarkable. No biliary ductal dilatation. SPLEEN: The spleen is unremarkable. PANCREAS: The pancreas is unremarkable. ADRENAL GLANDS: Bilateral adrenal glands demonstrate no acute abnormality. KIDNEYS, URETERS AND BLADDER: No stones in the kidneys or ureters. No hydronephrosis. No perinephric or periureteral stranding. Foley catheter in the bladder, which is decompressed. GI AND BOWEL: NG tube in the stomach. Stomach and  duodenal sweep demonstrate no acute abnormality. Normal appendix. There is no bowel obstruction. No abnormal bowel wall thickening or distension. REPRODUCTIVE: Reproductive organs are unremarkable. PERITONEUM AND RETROPERITONEUM: 6.5 x 6.2 cm presacral perirectal fluid collection noted. This was present in 2012 when this measured 6.3 x 4.1 cm. The very slow growth over 13 years suggests a benign process. Moderate-sized umbilical hernia containing fat and a small amount of fluid. This has enlarged since 2012. No ascites or free air. LYMPH NODES: No lymphadenopathy. BONES AND SOFT TISSUES: No acute abnormality of the bones. No acute soft tissue abnormality. IMPRESSION: 1. No active GI bleeding. 2. No occlusion or hemodynamically significant stenosis of the abdominal or pelvic arterial system. No abdominal aortic aneurysm or dissection. 3. Presacral perirectal 6.5 x 6.2 cm fluid collection, minimally changed since 2012 and likely benign. 4. Moderate-sized umbilical hernia containing fat and a small amount of fluid, increased in size since 2012. Electronically signed by: Franky Crease MD 06/15/2024 11:42 PM EST RP Workstation: [id number]   CT HEAD WO CONTRAST ( ) Result Date: 06/15/2024 EXAM: CT HEAD WITHOUT CONTRAST 06/15/2024 11:23:23 PM TECHNIQUE: CT of the head was performed without the administration of intravenous contrast. Automated exposure control, iterative reconstruction, and/or weight based adjustment of the mA/kV was utilized to reduce the radiation dose to as low as reasonably achievable. COMPARISON: 09/01/2015. CLINICAL HISTORY: Mental status change, unknown cause. FINDINGS: BRAIN AND VENTRICLES: No acute hemorrhage. No evidence of acute infarct. No hydrocephalus. No extra-axial collection. No  mass effect or midline shift. ORBITS: Soft tissue swelling along the lateral aspect of the left orbit. SINUSES: Mucosal thickening in ethmoid air cells. SOFT TISSUES AND SKULL: Partially visualized enteric tube.  No acute soft tissue abnormality. No skull fracture. IMPRESSION: 1. No acute intracranial abnormality. 2. Soft tissue swelling along the lateral aspect of the left orbit. 3. Mucosal thickening in ethmoid air cells. Electronically signed by: Donnice Mania MD 06/15/2024 11:33 PM EST RP Workstation: [id number]   DG Abd Portable 1 View Result Date: 06/15/2024 EXAM: 1 VIEW XRAY OF THE ABDOMEN 06/15/2024 09:23:00 PM COMPARISON: None available. CLINICAL HISTORY: OB tube insertion. FINDINGS: LIMITATIONS: Limited field of view for tube placement verification purposes. LINES, TUBES AND DEVICES: An enteric tube has been placed with the tip projecting over the upper mid-abdomen, consistent with its location in the upper stomach. BOWEL: Nonobstructive bowel gas pattern. SOFT TISSUES: No opaque urinary calculi. BONES: No acute osseous abnormality. LUNGS: Infiltrates demonstrated in the left lung base. IMPRESSION: 1. Enteric tube tip projects over the upper mid-abdomen, consistent with its location in the upper stomach. 2. Left basilar pulmonary infiltrates. Electronically signed by: Elsie Gravely MD 06/15/2024 09:27 PM EST RP Workstation: [id number]   DG Chest Port 1 View Result Date: 06/15/2024 EXAM: 1 VIEW(S) XRAY OF THE CHEST 06/15/2024 09:23:00 PM COMPARISON: Comparison with 06/15/2024. CLINICAL HISTORY: [id number] Encounter for intubation [id number] Encounter for intubation. FINDINGS: LINES, TUBES AND DEVICES: Interval placement of an endotracheal tube with the tip measuring 3.1 cm above the carina. An enteric tube has been placed. The tip is off the field of view but below the left hemidiaphragm. LUNGS AND PLEURA: Shallow inspiration. Airspace infiltrates in the right perihilar region and throughout the left lung, similar to the prior study. This may represent pneumonia, edema, or aspiration. No pleural effusion. No pneumothorax. HEART AND MEDIASTINUM: Cardiac enlargement. Calcification of the aorta. BONES AND SOFT  TISSUES: No acute osseous abnormality. IMPRESSION: 1. Interval placement of an endotracheal tube with the tip measuring 3.1 cm above the carina and an enteric tube with the tip off the field of view but below the left hemidiaphragm. 2. Airspace infiltrates in the right perihilar region and throughout the left lung, similar to the prior study, which may represent pneumonia, edema, or aspiration. 3. Cardiac enlargement. Electronically signed by: Elsie Gravely MD 06/15/2024 09:26 PM EST RP Workstation: [id number]   DG Chest Portable 1 View Result Date: 06/15/2024 CLINICAL DATA:  Cough, hematemesis EXAM: PORTABLE CHEST 1 VIEW COMPARISON:  08/28/2021 FINDINGS: Single frontal view of the chest demonstrates an enlarged cardiac silhouette. There is left perihilar airspace disease, consistent with hemorrhage, asymmetric edema, or infection. No effusion or pneumothorax. The right chest is clear. IMPRESSION: 1. Left perihilar airspace disease consistent with hemorrhage, infection, or asymmetric edema. 2. Enlarged cardiac silhouette. Electronically Signed   By: Ozell Porter M.D.   On: 06/15/2024 20:37      Subjective: Seen and examined on the day of discharge.  Stable no distress.  Appropriate for discharge home.  Discharge Exam: Vitals:   06/29/24 0459 06/29/24 0904  BP: (!) 140/40 (!) 116/56  Pulse: 92 79  Resp:  18  Temp: 97.7 F (36.5 C) 98.9 F (37.2 C)  SpO2: 93% 94%   Vitals:   06/28/24 1600 06/28/24 2047 06/29/24 0459 06/29/24 0904  BP: (!) 127/47 (!) 114/59 (!) 140/40 (!) 116/56  Pulse: 82 61 92 79  Resp: 18 16  18   Temp: 98.1 F (36.7 C) 98.2 F (36.8 C) 97.7 F (  36.5 C) 98.9 F (37.2 C)  TempSrc: Oral Oral    SpO2: 95% 94% 93% 94%  Weight:      Height:        General: Pt is alert, awake, not in acute distress Cardiovascular: RRR, S1/S2 +, no rubs, no gallops Respiratory: CTA bilaterally, no wheezing, no rhonchi Abdominal: Soft, NT, ND, bowel sounds + Extremities: no  edema, no cyanosis    The results of significant diagnostics from this hospitalization (including imaging, microbiology, ancillary and laboratory) are listed below for reference.     Microbiology: No results found for this or any previous visit (from the past 240 hours).   Labs: BNP (last 3 results) No results for input(s): BNP in the last 8760 hours. Basic Metabolic Panel: Recent Labs  Lab 06/24/24 0800 06/26/24 1227 06/27/24 0648 06/28/24 0612 06/29/24 0641  NA 141 138 139 135 138  K 3.9 3.6 3.3* 3.2* 3.1*  CL 101 91* 92* 89* 93*  CO2 33* 34* 33* 34* 32  GLUCOSE 89 99 85 90 104*  BUN 32* 16 21 27* 23  CREATININE 0.67 0.72 0.84 1.08* 0.92  CALCIUM 9.0 9.3 9.4 9.2 9.4   Liver Function Tests: No results for input(s): AST, ALT, ALKPHOS, BILITOT, PROT, ALBUMIN in the last 168 hours. No results for input(s): LIPASE, AMYLASE in the last 168 hours. No results for input(s): AMMONIA in the last 168 hours. CBC: Recent Labs  Lab 06/26/24 1227 06/27/24 0648 06/28/24 0612  WBC 9.5 12.2* 10.6*  NEUTROABS  --  7.6 6.4  HGB 12.6 12.7 12.5  HCT 37.1 37.5 37.1  MCV 89.8 91.5 91.4  PLT 93* 107* 111*   Cardiac Enzymes: No results for input(s): CKTOTAL, CKMB, CKMBINDEX, TROPONINI in the last 168 hours. BNP: Invalid input(s): POCBNP CBG: Recent Labs  Lab 06/28/24 1644 06/28/24 2104 06/29/24 0906 06/29/24 1229 06/29/24 1304  GLUCAP 96 119* 113* 94 94   D-Dimer No results for input(s): DDIMER in the last 72 hours. Hgb A1c No results for input(s): HGBA1C in the last 72 hours. Lipid Profile No results for input(s): CHOL, HDL, LDLCALC, TRIG, CHOLHDL, LDLDIRECT in the last 72 hours. Thyroid  function studies No results for input(s): TSH, T4TOTAL, T3FREE, THYROIDAB in the last 72 hours.  Invalid input(s): FREET3 Anemia work up No results for input(s): VITAMINB12, FOLATE, FERRITIN, TIBC, IRON, RETICCTPCT in  the last 72 hours. Urinalysis    Component Value Date/Time   COLORURINE YELLOW (A) 06/15/2024 2203   APPEARANCEUR CLEAR (A) 06/15/2024 2203   APPEARANCEUR Hazy (A) 01/19/2021 1550   LABSPEC 1.013 06/15/2024 2203   PHURINE 6.0 06/15/2024 2203   GLUCOSEU NEGATIVE 06/15/2024 2203   HGBUR NEGATIVE 06/15/2024 2203   BILIRUBINUR NEGATIVE 06/15/2024 2203   BILIRUBINUR Negative 01/19/2021 1550   KETONESUR NEGATIVE 06/15/2024 2203   PROTEINUR NEGATIVE 06/15/2024 2203   NITRITE NEGATIVE 06/15/2024 2203   LEUKOCYTESUR NEGATIVE 06/15/2024 2203   Sepsis Labs Recent Labs  Lab 06/26/24 1227 06/27/24 0648 06/28/24 0612  WBC 9.5 12.2* 10.6*   Microbiology No results found for this or any previous visit (from the past 240 hours).   Time coordinating discharge: 40 minutes  SIGNED:   Calvin KATHEE Robson, MD  [facility] 06/29/2024, 2:20 PM Pager   If 7PM-7AM, please contact night-coverage

## 2024-06-29 NOTE — TOC Progression Note (Signed)
 Transition of Care [facility]) - Progression Note    Patient Details  Name: [name] MRN: [id number] Date of Birth: 1950-09-04  Transition [facility] CM/SW Contact  Dalia GORMAN Fuse, RN Phone Number: 06/29/2024, 1:42 PM  Clinical Narrative:     TOC spoke with the patient's daughter [name] and reiterated therapy recs for STR. The patient's daughter is adamant that they can take care of the patient at home. She advised the [facility] that was delivered is to small. TOC explained that there was a message from the weekend advising the patient doesn't qualify for the bariatric BSC ( patient would need to be > 350 lbs). TOC advised her to check the Hospice store. [name] requested a WC as well.                     Expected Discharge Plan and Services         Expected Discharge Date: 06/24/24                                     Social Drivers of Health (SDOH) Interventions SDOH Screenings   Food Insecurity: No Food Insecurity (04/12/2022)  Housing: Low Risk  (04/12/2022)  Transportation Needs: No Transportation Needs (04/12/2022)  Utilities: Not At Risk (04/12/2022)  Alcohol Screen: Low Risk  (04/12/2022)  Depression (PHQ2-9): Low Risk  (04/12/2022)  Financial Resource Strain: Low Risk  (04/12/2022)  Physical Activity: Inactive (04/12/2022)  Social Connections: Socially Isolated (04/12/2022)  Stress: No Stress Concern Present (04/12/2022)  Tobacco Use: Low Risk  (06/15/2024)    Readmission Risk Interventions     No data to display

## 2024-06-29 NOTE — Progress Notes (Signed)
 Pt seen with OT earlier today to assess functional mobility with possible d/c home today instead of STR. Daughter present for bed mobility, transfers, and short distance gait with RW. VSS throughout session, no c/o dizziness, however fatigues quickly. Daughter feels comfortable with pt's d/c home and plenty of family members to assist. Hospital bed delivered and set up.   06/29/24 1500  PT Visit Information  Assistance Needed +2  PT/OT/SLP Co-Evaluation/Treatment Yes  Reason for Co-Treatment For patient/therapist safety;Complexity of the patient's impairments (multi-system involvement);To address functional/ADL transfers  PT goals addressed during session Mobility/safety with mobility;Balance  OT goals addressed during session ADL's and self-care  History of Present Illness Patient is a 73 year old female with acute hypoxic respiratory failure s/t suspected aspiration pneumonia in the setting of suspected acute upper GIB requiring vent support. Extubated 11/27. PMH: migraines, atrial fibrillation.  Subjective Data  Subjective I want to go home  Patient Stated Goal to go back home  Precautions  Precautions Fall  Recall of Precautions/Restrictions Impaired  Restrictions  Weight Bearing Restrictions Per Provider Order No  Pain Assessment  Pain Assessment No/denies pain  Cognition  Arousal Alert  Behavior During Therapy [facility] for tasks assessed/performed  Following Commands  Following commands Impaired  Following commands impaired Follows one step commands with increased time  Cueing  Cueing Techniques Verbal cues;Gestural cues;Tactile cues;Visual cues  Communication  Communication Impaired  Factors Affecting Communication Reduced clarity of speech  Bed Mobility  Overal bed mobility Needs Assistance  Bed Mobility Supine to Sit  Supine to sit Min assist;HOB elevated;Used rails  Sit to supine Supervision  General bed mobility comments min A via HHA to bring trunk upright and able to return  self to supine with increased time  Transfers  Overall transfer level Needs assistance  Equipment used Rolling walker (2 wheels)  Transfers Sit to/from Stand  Sit to Stand Min assist  Step pivot transfers Min assist;+2 safety/equipment;From elevated surface  General transfer comment stood from EOB and attempted orthostatic vitals, unable to get accurate reading d/t poor standing tolerance and need for BM, +2 present during session for safety but pt only requiring Min A x1  Ambulation/Gait  Ambulation/Gait assistance Min assist;+2 safety/equipment;+2 physical assistance  Gait Distance (Feet) 3 Feet  Assistive device Rolling walker (2 wheels)  Gait Pattern/deviations Decreased step length - right;Decreased step length - left;Decreased stride length  General Gait Details Pt ambulated a few feet in room with RW & min assist +2 heavy lean on RW with BUE.  Gait velocity decreased  Stairs  (No, pt will use w/c and assist from family)  Balance  Overall balance assessment Needs assistance  Sitting-balance support Feet supported  Sitting balance-Leahy Scale Good  Standing balance support Bilateral upper extremity supported;Reliant on assistive device for balance;During functional activity  Standing balance-Leahy Scale Fair  Standing balance comment RW and x1 external support for safety  Other Exercises  Other Exercises  (Pt and daughter educated on safe transfers and mobility once home. Daughter states plenty of family to help as well as DME and new hospital bed rental)  PT - End of Session  Equipment Utilized During Treatment Gait belt  Activity Tolerance Patient tolerated treatment well  Patient left in bed;with call bell/phone within reach;with bed alarm set;with family/visitor present  Nurse Communication Mobility status   PT - Assessment/Plan  PT Visit Diagnosis Muscle weakness (generalized) (M62.81);Unsteadiness on feet (R26.81);Other abnormalities of gait and mobility  (R26.89);Difficulty in walking, not elsewhere classified (R26.2)  PT  Frequency (ACUTE ONLY) Min 2X/week  Follow Up Recommendations Skilled nursing-short term rehab (<3 hours/day)  Can patient physically be transported by private vehicle No  Patient can return home with the following A lot of help with walking and/or transfers;A lot of help with bathing/dressing/bathroom;Assistance with cooking/housework;Supervision due to cognitive status;Help with stairs or ramp for entrance;Assist for transportation  PT equipment Wheelchair (measurements PT);Wheelchair cushion (measurements PT);Hospital bed;BSC/3in1  AM-PAC PT 6 Clicks Mobility Outcome Measure (Version 2)  Help needed turning from your back to your side while in a flat bed without using bedrails? 2  Help needed moving from lying on your back to sitting on the side of a flat bed without using bedrails? 1  Help needed moving to and from a bed to a chair (including a wheelchair)? 2  Help needed standing up from a chair using your arms (e.g., wheelchair or bedside chair)? 2  Help needed to walk in hospital room? 1  Help needed climbing 3-5 steps with a railing?  1  6 Click Score 9  Consider Recommendation of Discharge To: CIR/SNF/LTACH  Progressive Mobility  What is the highest level of mobility based on the mobility assessment? Level 3 (Stands with assistance) - Balance while standing  and cannot march in place  Activity Stood at bedside  PT Goal Progression  Progress towards PT goals Progressing toward goals  PT Time Calculation  PT Start Time (ACUTE ONLY) 1334  PT Stop Time (ACUTE ONLY) 1412  PT Time Calculation (min) (ACUTE ONLY) 38 min  PT Treatments  $Therapeutic Activity 23-37 mins  Darice Bohr, PTA

## 2024-07-10 NOTE — Progress Notes (Addendum)
 CONTINUING CARE NETWORK Brief Note  After a thorough review of the chart this patient does not meet criteria for a CCN Enrollment at this time. Reason for Disqualification: Discharge Disposition.  Please contact [phone] with any further questions.
